# Patient Record
Sex: Female | Born: 1980
Health system: Southern US, Community
[De-identification: ages and names within clinical notes are randomized; demographics above are authoritative.]

## PROBLEM LIST (undated history)

## (undated) DIAGNOSIS — Z86718 Personal history of other venous thrombosis and embolism: Secondary | ICD-10-CM

## (undated) DIAGNOSIS — Z8639 Personal history of other endocrine, nutritional and metabolic disease: Secondary | ICD-10-CM

## (undated) DIAGNOSIS — J302 Other seasonal allergic rhinitis: Secondary | ICD-10-CM

## (undated) DIAGNOSIS — R51 Headache: Secondary | ICD-10-CM

## (undated) DIAGNOSIS — J189 Pneumonia, unspecified organism: Secondary | ICD-10-CM

## (undated) DIAGNOSIS — F419 Anxiety disorder, unspecified: Secondary | ICD-10-CM

## (undated) DIAGNOSIS — H52209 Unspecified astigmatism, unspecified eye: Secondary | ICD-10-CM

## (undated) DIAGNOSIS — I2699 Other pulmonary embolism without acute cor pulmonale: Secondary | ICD-10-CM

## (undated) DIAGNOSIS — D219 Benign neoplasm of connective and other soft tissue, unspecified: Secondary | ICD-10-CM

## (undated) DIAGNOSIS — H539 Unspecified visual disturbance: Secondary | ICD-10-CM

## (undated) DIAGNOSIS — J45909 Unspecified asthma, uncomplicated: Secondary | ICD-10-CM

## (undated) DIAGNOSIS — Z973 Presence of spectacles and contact lenses: Secondary | ICD-10-CM

## (undated) DIAGNOSIS — N939 Abnormal uterine and vaginal bleeding, unspecified: Secondary | ICD-10-CM

## (undated) DIAGNOSIS — E559 Vitamin D deficiency, unspecified: Secondary | ICD-10-CM

## (undated) DIAGNOSIS — M2550 Pain in unspecified joint: Secondary | ICD-10-CM

## (undated) DIAGNOSIS — R7303 Prediabetes: Secondary | ICD-10-CM

## (undated) DIAGNOSIS — D649 Anemia, unspecified: Secondary | ICD-10-CM

## (undated) DIAGNOSIS — E282 Polycystic ovarian syndrome: Secondary | ICD-10-CM

## (undated) HISTORY — DX: Headache: R51

## (undated) HISTORY — DX: Vitamin D deficiency, unspecified: E55.9

## (undated) HISTORY — PX: DILATION AND CURETTAGE OF UTERUS: SHX78

## (undated) HISTORY — DX: Prediabetes: R73.03

## (undated) HISTORY — DX: Benign neoplasm of connective and other soft tissue, unspecified: D21.9

## (undated) HISTORY — DX: Personal history of other venous thrombosis and embolism: Z86.718

## (undated) HISTORY — DX: Pain in unspecified joint: M25.50

## (undated) HISTORY — DX: Unspecified visual disturbance: H53.9

## (undated) HISTORY — DX: Unspecified astigmatism, unspecified eye: H52.209

## (undated) HISTORY — DX: Polycystic ovarian syndrome: E28.2

## (undated) HISTORY — DX: Anemia, unspecified: D64.9

## (undated) HISTORY — PX: MYOMECTOMY: SHX85

---

## 2004-06-08 HISTORY — PX: WISDOM TOOTH EXTRACTION: SHX21

## 2004-10-07 ENCOUNTER — Emergency Department (HOSPITAL_COMMUNITY): Admission: EM | Admit: 2004-10-07 | Discharge: 2004-10-07 | Payer: Self-pay | Admitting: Emergency Medicine

## 2010-08-15 ENCOUNTER — Inpatient Hospital Stay (INDEPENDENT_AMBULATORY_CARE_PROVIDER_SITE_OTHER)
Admission: RE | Admit: 2010-08-15 | Discharge: 2010-08-15 | Disposition: A | Discharge status: Home / Self Care | Payer: 59 | Source: Ambulatory Visit | Attending: Family Medicine | Admitting: Family Medicine

## 2010-08-15 DIAGNOSIS — J019 Acute sinusitis, unspecified: Secondary | ICD-10-CM

## 2011-07-03 ENCOUNTER — Emergency Department (HOSPITAL_COMMUNITY)
Admission: EM | Admit: 2011-07-03 | Discharge: 2011-07-03 | Disposition: A | Discharge status: Home / Self Care | Payer: 59 | Source: Home / Self Care

## 2011-07-03 ENCOUNTER — Encounter (HOSPITAL_COMMUNITY): Payer: Self-pay

## 2011-07-03 DIAGNOSIS — J209 Acute bronchitis, unspecified: Secondary | ICD-10-CM

## 2011-07-03 DIAGNOSIS — J069 Acute upper respiratory infection, unspecified: Secondary | ICD-10-CM

## 2011-07-03 MED ORDER — AZITHROMYCIN 250 MG PO TABS: ORAL_TABLET | ORAL | Status: AC

## 2011-07-03 MED ORDER — GUAIFENESIN-CODEINE 100-10 MG/5ML PO SYRP: ORAL_SOLUTION | ORAL | Status: AC

## 2011-07-03 NOTE — ED Notes (Signed)
Pt states sx started on Tuesday with scratchy throat, states since then she has developed productive cough, runny nose, nasal congestion, sinus pressure and just "feels bad".

## 2011-07-03 NOTE — ED Provider Notes (Signed)
History     CSN: [id number]  Arrival date & time 07/03/11  1058   None     Chief Complaint  Patient presents with  . URI    (Consider location/radiation/quality/duration/timing/severity/associated sxs/prior treatment) HPI Comments: Patient presents with complaints of cold symptoms for the last 4-5 days. She states her symptoms are progressively worsening. She began with feeling of congestion in her throat 5 days ago. The next day she felt like she had an itchy scratchy throat. Then the following day began nasal congestion, postnasal drainage and cough. Her symptoms have continued to worsen now she feels fatigued and states that she is just overall feeling poorly. She is taking Mucinex and over-the-counter cough medication with minimal relief.   History reviewed. No pertinent past medical history.  History reviewed. No pertinent past surgical history.  No family history on file.  History  Substance Use Topics  . Smoking status: Never Smoker   . Smokeless tobacco: Not on file  . Alcohol Use: Yes     occasional    OB History    Grav Para Term Preterm Abortions TAB SAB Ect Mult Living                  Review of Systems  Constitutional: Positive for fatigue. Negative for fever and chills.  HENT: Positive for congestion, rhinorrhea and postnasal drip. Negative for ear pain, sore throat, sneezing and sinus pressure.   Respiratory: Positive for cough. Negative for shortness of breath and wheezing.   Cardiovascular: Negative for chest pain and palpitations.    Allergies  Review of patient's allergies indicates no known allergies.  Home Medications   Current Outpatient Rx  Name Route Sig Dispense Refill  . NORGESTIM-ETH ESTRAD TRIPHASIC 0.18/0.215/0.25 MG-25 MCG PO TABS Oral Take 1 tablet by mouth daily.    . AZITHROMYCIN 250 MG PO TABS  take 2 tabs today, then 1 tab daily x 4 days 6 each 0  . GUAIFENESIN-CODEINE 100-10 MG/5ML PO SYRP  1-2 tsp every 6 hrs prn cough 120 mL  0    BP 114/67  Pulse 86  Temp(Src) 98.1 F (36.7 C) (Oral)  Resp 18  SpO2 99%  LMP 06/15/2011  Physical Exam  Nursing note and vitals reviewed. Constitutional: She appears well-developed and well-nourished. No distress.  HENT:  Head: Normocephalic and atraumatic.  Right Ear: Tympanic membrane, external ear and ear canal normal.  Left Ear: Tympanic membrane, external ear and ear canal normal.  Nose: Nose normal. Right sinus exhibits no maxillary sinus tenderness. Left sinus exhibits no maxillary sinus tenderness.  Mouth/Throat: Uvula is midline, oropharynx is clear and moist and mucous membranes are normal. No oropharyngeal exudate, posterior oropharyngeal edema or posterior oropharyngeal erythema.  Neck: Neck supple.  Cardiovascular: Normal rate, regular rhythm and normal heart sounds.   Pulmonary/Chest: Effort normal and breath sounds normal. No respiratory distress.  Lymphadenopathy:    She has no cervical adenopathy.  Neurological: She is alert.  Skin: Skin is warm and dry.  Psychiatric: She has a normal mood and affect.    ED Course  Procedures (including critical care time)  Labs Reviewed - No data to display No results found.   1. Acute URI   2. Acute bronchitis       MDM  Worsening URI symptoms and cough. Exam neg.         Melody Comas, [place] 07/03/11 1325

## 2011-07-03 NOTE — ED Provider Notes (Signed)
Medical screening examination/treatment/procedure(s) were performed by non-physician practitioner and as supervising physician I was immediately available for consultation/collaboration.  Luiz Blare MD   Luiz Blare, MD 07/03/11 1728

## 2012-05-19 ENCOUNTER — Encounter (HOSPITAL_COMMUNITY): Payer: Self-pay | Admitting: Emergency Medicine

## 2012-05-19 ENCOUNTER — Emergency Department (INDEPENDENT_AMBULATORY_CARE_PROVIDER_SITE_OTHER): Payer: 59

## 2012-05-19 ENCOUNTER — Emergency Department (HOSPITAL_COMMUNITY)
Admission: EM | Admit: 2012-05-19 | Discharge: 2012-05-19 | Disposition: A | Discharge status: Home / Self Care | Payer: 59 | Source: Home / Self Care | Attending: Emergency Medicine | Admitting: Emergency Medicine

## 2012-05-19 DIAGNOSIS — J189 Pneumonia, unspecified organism: Secondary | ICD-10-CM

## 2012-05-19 DIAGNOSIS — R05 Cough: Secondary | ICD-10-CM

## 2012-05-19 DIAGNOSIS — R059 Cough, unspecified: Secondary | ICD-10-CM

## 2012-05-19 MED ORDER — ALBUTEROL SULFATE HFA 108 (90 BASE) MCG/ACT IN AERS: 1.0000 | INHALATION_SPRAY | Freq: Four times a day (QID) | RESPIRATORY_TRACT | Status: DC | PRN

## 2012-05-19 MED ORDER — SALINE NASAL SPRAY 0.65 % NA SOLN: 1.0000 | NASAL | Status: DC | PRN

## 2012-05-19 MED ORDER — AMOXICILLIN-POT CLAVULANATE 875-125 MG PO TABS: 1.0000 | ORAL_TABLET | Freq: Two times a day (BID) | ORAL | Status: DC

## 2012-05-19 MED ORDER — GUAIFENESIN-CODEINE 100-10 MG/5ML PO SYRP: 5.0000 mL | ORAL_SOLUTION | Freq: Three times a day (TID) | ORAL | Status: DC | PRN

## 2012-05-19 MED ORDER — AZITHROMYCIN 250 MG PO TABS: ORAL_TABLET | ORAL | Status: DC

## 2012-05-19 NOTE — ED Notes (Signed)
Pt c/o productive cough that is gradually getting worse, chest tightness, sinus and chest congestion. Unable to rest at night due to coughing fits. Pt denies fever, n/v/d. Pt states that she has been taking mucinex x 2 daily and aleve with no relief of symptoms.

## 2012-05-19 NOTE — ED Provider Notes (Signed)
History     CSN: [id number]  Arrival date & time 05/19/12  1601   First MD Initiated Contact with Patient 05/19/12 1804      Chief Complaint  Patient presents with  . Cough    productive cough, chest tightness, sinus pressure    (Consider location/radiation/quality/duration/timing/severity/associated sxs/prior treatment) Patient is a 31 y.o. female presenting with cough. The history is provided by the patient.  Cough This is a new problem. The current episode started 2 days ago. The problem occurs hourly. The problem has been gradually worsening. The cough is non-productive. There has been no fever. Associated symptoms include chest pain, chills, headaches, myalgias and shortness of breath. Pertinent negatives include no ear congestion, no ear pain and no sore throat. She has tried decongestants (cough syrup, mucinex) for the symptoms. The treatment provided mild relief. She is not a smoker. Her past medical history does not include bronchitis, pneumonia, COPD or asthma.    History reviewed. No pertinent past medical history.  History reviewed. No pertinent past surgical history.  History reviewed. No pertinent family history.  History  Substance Use Topics  . Smoking status: Never Smoker   . Smokeless tobacco: Not on file  . Alcohol Use: Yes     Comment: occasional    OB History    Grav Para Term Preterm Abortions TAB SAB Ect Mult Living                  Review of Systems  Constitutional: Positive for chills.  HENT: Negative for ear pain and sore throat.   Respiratory: Positive for cough and shortness of breath.   Cardiovascular: Positive for chest pain.  Musculoskeletal: Positive for myalgias.  Neurological: Positive for headaches.  All other systems reviewed and are negative.    Allergies  Review of patient's allergies indicates no known allergies.  Home Medications   Current Outpatient Rx  Name  Route  Sig  Dispense  Refill  . ALBUTEROL SULFATE HFA 108  (90 BASE) MCG/ACT IN AERS   Inhalation   Inhale 1-2 puffs into the lungs every 6 (six) hours as needed for wheezing.   1 Inhaler   0   . AMOXICILLIN-POT CLAVULANATE 875-125 MG PO TABS   Oral   Take 1 tablet by mouth every 12 (twelve) hours.   20 tablet   0   . AZITHROMYCIN 250 MG PO TABS      Azithromycin 500mg  on day 1, then 250mg  on days 2-4   6 tablet   0   . GUAIFENESIN-CODEINE 100-10 MG/5ML PO SYRP   Oral   Take 5 mLs by mouth 3 (three) times daily as needed for cough.   120 mL   0   . NORGESTIM-ETH ESTRAD TRIPHASIC 0.18/0.215/0.25 MG-25 MCG PO TABS   Oral   Take 1 tablet by mouth daily.         [name] SALINE NASAL SPRAY 0.65 % NA SOLN   Nasal   Place 1 spray into the nose as needed for congestion.   30 mL   12     BP 120/79  Pulse 88  Temp 98.9 F (37.2 C) (Oral)  Resp 16  SpO2 100%  LMP 05/16/2012  Physical Exam  Nursing note and vitals reviewed. Constitutional: She is oriented to person, place, and time. Vital signs are normal. She appears well-developed and well-nourished. She is active and cooperative. No distress.  HENT:  Head: Normocephalic.  Right Ear: External ear normal.  Left Ear: External  ear normal.  Nose: Right sinus exhibits frontal sinus tenderness. Right sinus exhibits no maxillary sinus tenderness. Left sinus exhibits frontal sinus tenderness. Left sinus exhibits no maxillary sinus tenderness.  Mouth/Throat: Uvula is midline. Posterior oropharyngeal erythema present. No oropharyngeal exudate.  Eyes: Conjunctivae normal are normal. Pupils are equal, round, and reactive to light. No scleral icterus.  Neck: Trachea normal and normal range of motion. Neck supple. No Brudzinski's sign noted.  Cardiovascular: Normal rate, regular rhythm, normal heart sounds, intact distal pulses and normal pulses.   Pulmonary/Chest: Effort normal and breath sounds normal.  Abdominal: Soft. Bowel sounds are normal. There is no tenderness.  Lymphadenopathy:        Head (right side): No submental, no submandibular, no tonsillar, no preauricular, no posterior auricular and no occipital adenopathy present.       Head (left side): No submental, no submandibular, no tonsillar, no preauricular, no posterior auricular and no occipital adenopathy present.    She has no cervical adenopathy.  Neurological: She is alert and oriented to person, place, and time. No cranial nerve deficit or sensory deficit. GCS eye subscore is 4. GCS verbal subscore is 5. GCS motor subscore is 6.  Skin: Skin is warm and dry. She is not diaphoretic.  Psychiatric: She has a normal mood and affect. Her speech is normal and behavior is normal. Judgment and thought content normal. Cognition and memory are normal.    ED Course  Procedures (including critical care time)  Labs Reviewed - No data to display Dg Chest 2 View  05/19/2012  *RADIOLOGY REPORT*  Clinical Data: Cough and congestion.  CHEST - 2 VIEW  Comparison: None.  Findings: There is right lower lobe airspace disease worrisome for pneumonia.  The left lung is clear.  Lung volumes are low.  No pneumothorax or pleural fluid.  Heart size normal.  IMPRESSION: Right lower lobe airspace disease worrisome for pneumonia.   Original Report Authenticated By: Holley Dexter, M.D.      1. Community acquired pneumonia   2. Cough       MDM  Increase fluids, medications as prescribed.  Follow up with pcp or mcuc in 2 days for re-eval.       Johnsie Kindred, NP 05/19/12 1846

## 2012-05-19 NOTE — ED Notes (Deleted)
Pt c/o stomach pain x 2 days and diarrhea x3 days. Pt states that he ate a lot of  Chips at airport in [place]. Pt father states that he started feeling ? Ill after eating chips. Pt denies any other symptoms.

## 2012-05-19 NOTE — ED Provider Notes (Signed)
Medical screening examination/treatment/procedure(s) were performed by non-physician practitioner and as supervising physician I was immediately available for consultation/collaboration.  Emerie Vanderkolk, M.D.   Kalon Erhardt C Hisayo Delossantos, MD 05/19/12 2250 

## 2014-01-17 ENCOUNTER — Encounter: Payer: Self-pay | Admitting: *Deleted

## 2014-01-18 ENCOUNTER — Ambulatory Visit (INDEPENDENT_AMBULATORY_CARE_PROVIDER_SITE_OTHER): Payer: 59 | Admitting: Neurology

## 2014-01-18 ENCOUNTER — Encounter: Payer: Self-pay | Admitting: Neurology

## 2014-01-18 VITALS — BP 120/74 | HR 89 | Ht 63.0 in | Wt 240.0 lb

## 2014-01-18 DIAGNOSIS — H53139 Sudden visual loss, unspecified eye: Secondary | ICD-10-CM

## 2014-01-18 DIAGNOSIS — H53131 Sudden visual loss, right eye: Secondary | ICD-10-CM

## 2014-01-18 LAB — BASIC METABOLIC PANEL
BUN / CREAT RATIO: 12 (ref 8–20)
BUN: 11 mg/dL (ref 6–20)
CALCIUM: 8.9 mg/dL (ref 8.7–10.2)
CHLORIDE: 103 mmol/L (ref 96–108)
CO2: 25 mmol/L (ref 18–29)
Creatinine, Ser: 0.93 mg/dL (ref 0.57–1.00)
GFR calc Af Amer: 93 mL/min/{1.73_m2} (ref >59)
GFR calc non Af Amer: 80 mL/min/{1.73_m2} (ref >59)
Glucose: 81 mg/dL (ref 65–99)
POTASSIUM: 4.1 mmol/L (ref 3.5–5.2)
SODIUM: 136 mmol/L (ref 134–144)

## 2014-01-18 NOTE — Progress Notes (Addendum)
[facility]    Provider:  Dr Jaynee Eagles Referring Provider: Marylynn Pearson, MD Primary Care Physician:  Marylynn Pearson, MD  CC:  Sudden persistent vision loss  HPI:  Amanda Murphy is a 33 y.o. female here as a referral from Dr. [name] for sudden persistent vision loss  33 year old with a PMHx of migraines, obesity, PCOS who is here for a new appointment to evaluate sudden persistent right eye vision changes since May. The right eye is blurry. Saw an optometrist in June with a normal fundoscopic eye exam. Vision changes started during a migraine and never resolved. Vision changes were sudden, did not develop over a period of hours or days. Is stable, never worsened or progressed. No flickers or flashes of light. Left eye is unaffected. No changes in color either eye. No droopy eyelids, no dysarthria, no dysphagia, no changes in sensation, no weakness, no gait changes, no hearing changes or any other focal neurologic symptoms. Nothing makes it better, nothing makes it worse. No head trauma. Does have some sinus problems. No new medications at the time. Has not had any imaging of brain. No significant past medical history of or episodes of focal neurologic symptoms. No pain on eye movements. Migraines once a year due to sinuses described as pressure around the whole head, not throbbing, no nausea, no vomiting no light sensitivity. Hasn't had a headache since May. The headache in May was different than her normal headaches, the right side of face was hurting as well with a radiating type pain she can't remember well enough to describe.  Denies diabetes, no high cholesterol, no HTN, no smoking. No FHx of neurologic disorders. No FHx of stroke. Has an OBGYN Dr. Kathryne Eriksson checks bloodwork yearly including diabetes and cholersterol  Had a visual evoked potential. And formal Visual field testing in June.   Reviewed notes, labs and imaging from outside physicians, which showed:    11/29/2013: OD 20/80, OS 20/30, normal slit lamp examination, normal fundoscopic exam,  12/05/2013: OD 20/70, OS 20/25, PERRL, RT hemi VF defect on Humphrey Visual Field testing, normal slit lamp exam. Normal pressure OD 21 OS 17, normal fundoscopic exam,  12/20/2013: OD 20/60, OS 20/30, IOP OD15 OS 12, OD left hemi and OS rt quad on Humphrey Visual Field testing, slit lmp normal, fundoscopic exam nml,     Review of Systems: Out of a complete 14 system review, the patient complains of only the following symptoms, and all other reviewed systems are negative. Blurred vision  History   Social History  . Marital Status: Single    Spouse Name: N/A    Number of Children: 0  . Years of Education: BSN   Occupational History  .  [employer]   Social History Main Topics  . Smoking status: Never Smoker   . Smokeless tobacco: Never Used  . Alcohol Use: Yes     Comment: occasional  . Drug Use: No  . Sexual Activity: Yes    Birth Control/ Protection: Pill   Other Topics Concern  . Not on file   Social History Narrative   Patient is single with no children.   Patient is right handed.   Patient has a Copywriter, advertising.   Patient drinks coffee 3 cups a week, tea once daily.    Family History  Problem Relation Age of Onset  . Hypertension Mother   . Diabetes Mother     No past medical history on file.  Past Surgical History  Procedure  Laterality Date  . Robotic assisted laparoscopic vaginal hysterectomy with fibroid removal  02/07/2014    [facility]  . Wisdom tooth extraction  2006    Current Outpatient Prescriptions  Medication Sig Dispense Refill  . etonogestrel-ethinyl estradiol (NUVARING) 0.12-0.015 MG/24HR vaginal ring Place 1 each vaginally every 28 (twenty-eight) days. Insert vaginally and leave in place for 3 consecutive weeks, then remove for 1 week.      . phentermine 37.5 MG capsule Take 37.5 mg by mouth every morning.       No current facility-administered medications for this visit.     Allergies as of 01/18/2014  . (No Known Allergies)    Vitals: BP 120/74  Pulse 89  Ht 5\' 3"  (1.6 m)  Wt 240 lb (108.863 kg)  BMI 42.52 kg/m2 Last Weight:  Wt Readings from Last 1 Encounters:  01/18/14 240 lb (108.863 kg)   Last Height:   Ht Readings from Last 1 Encounters:  01/18/14 5\' 3"  (1.6 m)     Physical exam: Exam: Gen: NAD, conversant Eyes: anicteric sclerae, moist conjunctivae HENT: Atraumatic, oropharynx clear Neck: Trachea midline; supple,  Lungs: CTA, no wheezing, rales, rhonic                          CV: RRR, no MRG Abdomen: Soft, non-tender;  Extremities: No peripheral edema  Skin: Normal temperature, no rash,  Psych: Appropriate affect, pleasant  Neuro: MS: AA&Ox3, appropriately interactive, normal affect   Speech: fluent w/o paraphasic error  Memory: good recent and remote recall  CN: PERRL, EOMI no nystagmus, Fundi Flat, No APD, VA 20/100 OU corected, 20/30 OS corrected, no ptosis, sensation intact to LT V1-V3 bilat, face symmetric, no weakness, hearing grossly intact, palate elevates symmetrically, shoulder shrug 5/5 bilat, tongue protrudes midline, no fasiculations noted. Right upper quadrant visual defect to confrontation.  Motor: normal bulk and tone Strength: 5/5  In all extremities  Coord: rapid alternating and point-to-point (FNF, HTS) movements intact.  Reflexes: symmetrical, bilat downgoing toes  Sens: LT intact in all extremities  Gait: posture, stance, stride and arm-swing normal. Tandem gait intact. Able to walk on heels and toes. Romberg absent.   Assessment and plan:  33 year old patient with acute, persistent, stable monocular temporal superior right quadrant visual defect. Neurologic exam is otherwise normal including fundoscopic exam. Symptoms occurred 3 months ago in the setting of a headache and have remained stable. No other inciting factors, no risk factors for stroke other than obesity (denies DM, HTN, HLD, smoking  or other vascular risk factors).   DDX includes optic neuritis, ischemic optic neuropathy, branch retinal vascular occlusion or possibly structural cause such as a juxta-sellar lesion. MRI results will guide further testing.   Will order MRI of the brain w/wo contrast MS protocol and stroke protocol, MRA of the head and neck. May need referral for dilated eye exam and neuro-opthamologist pending results of MRI Formal visual field testing and evoked potentials were completed.   A total of 75 minutes was spent in with this patient. Over half this time was spent on counseling patient on the diagnosis and different therapeutic options available.    Addendum: Documentation received after appointment from [facility] shows formal visual field analysis with deficits in the  right temporal upper and lower quadrants  Sarina Ill, MD  [facility] [address] [place], [place] [address]  Phone [phone] Fax [phone]

## 2014-01-18 NOTE — Patient Instructions (Signed)
Remember to drink plenty of fluid, eat healthy meals and do not skip any meals. Try to eat protein with a every meal and eat a healthy snack such as fruit or nuts in between meals. Try to keep a regular sleep-wake schedule and try to exercise daily, particularly in the form of walking, 20-30 minutes a day, if you can.   As far as diagnostic testing: MRI of the brain, MRA of the head and neck, routine blood work  I would like to see you back after imaging complete, sooner if we need to. Please call us with any interim questions, concerns, problems, updates or refill requests.   I will call you so we can go over those with you on the phone.  My clinical assistant and will answer any of your questions and relay your messages to me and also relay most of my messages to you.   Our phone number is [phone]. We also have an after hours call service for urgent matters and there is a physician on-call for urgent questions. For any emergencies you know to call 911 or go to the nearest emergency room

## 2014-01-19 ENCOUNTER — Encounter: Payer: Self-pay | Admitting: *Deleted

## 2014-01-25 ENCOUNTER — Encounter (HOSPITAL_COMMUNITY): Payer: Self-pay | Admitting: *Deleted

## 2014-01-30 ENCOUNTER — Encounter (HOSPITAL_COMMUNITY): Payer: Self-pay | Admitting: Pharmacist

## 2014-02-01 ENCOUNTER — Ambulatory Visit (INDEPENDENT_AMBULATORY_CARE_PROVIDER_SITE_OTHER): Payer: 59

## 2014-02-01 DIAGNOSIS — H53131 Sudden visual loss, right eye: Secondary | ICD-10-CM

## 2014-02-01 DIAGNOSIS — H53139 Sudden visual loss, unspecified eye: Secondary | ICD-10-CM

## 2014-02-01 MED ORDER — GADOPENTETATE DIMEGLUMINE 469.01 MG/ML IV SOLN: 20.0000 mL | Freq: Once | INTRAVENOUS | Status: AC | PRN

## 2014-02-05 ENCOUNTER — Other Ambulatory Visit: Payer: Self-pay | Admitting: Neurology

## 2014-02-05 DIAGNOSIS — E236 Other disorders of pituitary gland: Secondary | ICD-10-CM

## 2014-02-05 DIAGNOSIS — H539 Unspecified visual disturbance: Secondary | ICD-10-CM

## 2014-02-05 DIAGNOSIS — G930 Cerebral cysts: Secondary | ICD-10-CM

## 2014-02-06 MED ORDER — DEXTROSE 5 % IV SOLN
2.0000 g | INTRAVENOUS | Status: AC
  Administered 2014-02-07: 2 g via INTRAVENOUS
  Filled 2014-02-06: qty 2

## 2014-02-06 NOTE — H&P (Addendum)
33 yo G0 with menorrhagia and irregular vaginal bleeding despite treatment with nuvaring presents for surgical mngt  PMHx:  Cervical dysplasia, migraines PSHx:  Wisdom teeth, rectal cyst removed All:  None Meds:  MTV, nuvaring Shx:  Negative tobacco use, occ etoh FHx:  Thyroid dz, HTN, DM, Breast cancer, osteoporosis, arthritis  AF, VSS Gen - NAD CV - RRR Lungs - CTA Abd - soft, NT PV - uterus 10-12 wks, mobile, NT.  [place]:  Multiple fibroids, 2.8 cm intracavitary mass, suspect submucus fibroid.  2.1 cm simple ov cyst.  No FF Hgb 10 in office  A/P:  Menorrhagia Hysteroscopy, D&C, resection of intracavitary mass R/b/a discussed, questions answered, informed consent

## 2014-02-07 ENCOUNTER — Encounter (HOSPITAL_COMMUNITY): Payer: 59 | Admitting: Anesthesiology

## 2014-02-07 ENCOUNTER — Ambulatory Visit (HOSPITAL_COMMUNITY): Payer: 59 | Admitting: Anesthesiology

## 2014-02-07 ENCOUNTER — Encounter (HOSPITAL_COMMUNITY)
Admission: RE | Disposition: A | Discharge status: Home / Self Care | Payer: Self-pay | Source: Ambulatory Visit | Attending: Obstetrics and Gynecology

## 2014-02-07 ENCOUNTER — Ambulatory Visit (HOSPITAL_COMMUNITY)
Admission: RE | Admit: 2014-02-07 | Discharge: 2014-02-07 | Disposition: A | Discharge status: Home / Self Care | Payer: 59 | Source: Ambulatory Visit | Attending: Obstetrics and Gynecology | Admitting: Obstetrics and Gynecology

## 2014-02-07 ENCOUNTER — Encounter (HOSPITAL_COMMUNITY): Payer: Self-pay | Admitting: Anesthesiology

## 2014-02-07 DIAGNOSIS — N920 Excessive and frequent menstruation with regular cycle: Secondary | ICD-10-CM | POA: Diagnosis present

## 2014-02-07 DIAGNOSIS — Z6841 Body Mass Index (BMI) 40.0 and over, adult: Secondary | ICD-10-CM | POA: Diagnosis not present

## 2014-02-07 DIAGNOSIS — D250 Submucous leiomyoma of uterus: Secondary | ICD-10-CM | POA: Diagnosis not present

## 2014-02-07 LAB — CBC
HEMATOCRIT: 28.7 % — AB (ref 36.0–46.0)
HEMOGLOBIN: 8.6 g/dL — AB (ref 12.0–15.0)
MCH: 23.6 pg — ABNORMAL LOW (ref 26.0–34.0)
MCHC: 30 g/dL (ref 30.0–36.0)
MCV: 78.6 fL (ref 78.0–100.0)
Platelets: 480 10*3/uL — ABNORMAL HIGH (ref 150–400)
RBC: 3.65 MIL/uL — AB (ref 3.87–5.11)
RDW: 18.3 % — ABNORMAL HIGH (ref 11.5–15.5)
WBC: 6.1 10*3/uL (ref 4.0–10.5)

## 2014-02-07 LAB — PREGNANCY, URINE: PREG TEST UR: NEGATIVE

## 2014-02-07 SURGERY — DILATATION & CURETTAGE/HYSTEROSCOPY WITH MYOSURE
Anesthesia: Choice

## 2014-02-07 MED ORDER — LIDOCAINE HCL (CARDIAC) 20 MG/ML IV SOLN
INTRAVENOUS | Status: DC | PRN
  Administered 2014-02-07: 30 mg via INTRAVENOUS
  Administered 2014-02-07: 70 mg via INTRAVENOUS

## 2014-02-07 MED ORDER — LIDOCAINE HCL (CARDIAC) 20 MG/ML IV SOLN
INTRAVENOUS | Status: AC
  Filled 2014-02-07: qty 5

## 2014-02-07 MED ORDER — ONDANSETRON HCL 4 MG/2ML IJ SOLN
INTRAMUSCULAR | Status: DC | PRN
  Administered 2014-02-07: 4 mg via INTRAVENOUS

## 2014-02-07 MED ORDER — EPHEDRINE 5 MG/ML INJ
INTRAVENOUS | Status: AC
  Filled 2014-02-07: qty 10

## 2014-02-07 MED ORDER — DEXAMETHASONE SODIUM PHOSPHATE 4 MG/ML IJ SOLN
INTRAMUSCULAR | Status: AC
  Filled 2014-02-07: qty 1

## 2014-02-07 MED ORDER — ONDANSETRON HCL 4 MG/2ML IJ SOLN
INTRAMUSCULAR | Status: AC
  Filled 2014-02-07: qty 2

## 2014-02-07 MED ORDER — OXYCODONE-ACETAMINOPHEN 5-325 MG PO TABS: 1.0000 | ORAL_TABLET | ORAL | Status: DC | PRN

## 2014-02-07 MED ORDER — FENTANYL CITRATE 0.05 MG/ML IJ SOLN
INTRAMUSCULAR | Status: AC
  Filled 2014-02-07: qty 2

## 2014-02-07 MED ORDER — SCOPOLAMINE 1 MG/3DAYS TD PT72
MEDICATED_PATCH | TRANSDERMAL | Status: AC
  Administered 2014-02-07: 1.5 mg via TRANSDERMAL
  Filled 2014-02-07: qty 1

## 2014-02-07 MED ORDER — ACETAMINOPHEN 160 MG/5ML PO SOLN
960.0000 mg | Freq: Four times a day (QID) | ORAL | Status: DC | PRN
  Administered 2014-02-07: 960 mg via ORAL

## 2014-02-07 MED ORDER — DEXAMETHASONE SODIUM PHOSPHATE 10 MG/ML IJ SOLN
INTRAMUSCULAR | Status: DC | PRN
  Administered 2014-02-07: 4 mg via INTRAVENOUS

## 2014-02-07 MED ORDER — ACETAMINOPHEN 160 MG/5ML PO SOLN
ORAL | Status: AC
  Administered 2014-02-07: 960 mg via ORAL
  Filled 2014-02-07: qty 40.6

## 2014-02-07 MED ORDER — SODIUM CHLORIDE 0.9 % IR SOLN
Status: DC | PRN
  Administered 2014-02-07 (×3): 3000 mL

## 2014-02-07 MED ORDER — MIDAZOLAM HCL 2 MG/2ML IJ SOLN
INTRAMUSCULAR | Status: DC | PRN
  Administered 2014-02-07: 2 mg via INTRAVENOUS

## 2014-02-07 MED ORDER — SCOPOLAMINE 1 MG/3DAYS TD PT72
1.0000 | MEDICATED_PATCH | Freq: Once | TRANSDERMAL | Status: DC
  Administered 2014-02-07: 1.5 mg via TRANSDERMAL

## 2014-02-07 MED ORDER — PROPOFOL 10 MG/ML IV BOLUS
INTRAVENOUS | Status: DC | PRN
  Administered 2014-02-07: 160 mg via INTRAVENOUS

## 2014-02-07 MED ORDER — KETOROLAC TROMETHAMINE 30 MG/ML IJ SOLN
INTRAMUSCULAR | Status: AC
  Filled 2014-02-07: qty 1

## 2014-02-07 MED ORDER — LACTATED RINGERS IV SOLN
INTRAVENOUS | Status: DC
  Administered 2014-02-07 (×2): via INTRAVENOUS

## 2014-02-07 MED ORDER — FENTANYL CITRATE 0.05 MG/ML IJ SOLN
INTRAMUSCULAR | Status: DC | PRN
  Administered 2014-02-07 (×2): 50 ug via INTRAVENOUS

## 2014-02-07 MED ORDER — FENTANYL CITRATE 0.05 MG/ML IJ SOLN: 25.0000 ug | INTRAMUSCULAR | Status: DC | PRN

## 2014-02-07 MED ORDER — LIDOCAINE HCL 1 % IJ SOLN
INTRAMUSCULAR | Status: DC | PRN
Start: 2014-02-07 — End: 2014-02-07
  Administered 2014-02-07: 10 mL

## 2014-02-07 MED ORDER — MIDAZOLAM HCL 2 MG/2ML IJ SOLN
INTRAMUSCULAR | Status: AC
  Filled 2014-02-07: qty 2

## 2014-02-07 MED ORDER — EPHEDRINE SULFATE 50 MG/ML IJ SOLN
INTRAMUSCULAR | Status: DC | PRN
  Administered 2014-02-07: 5 mg via INTRAVENOUS
  Administered 2014-02-07: 10 mg via INTRAVENOUS

## 2014-02-07 MED ORDER — PROPOFOL 10 MG/ML IV EMUL
INTRAVENOUS | Status: AC
  Filled 2014-02-07: qty 20

## 2014-02-07 MED ORDER — LIDOCAINE HCL 1 % IJ SOLN
INTRAMUSCULAR | Status: AC
  Filled 2014-02-07: qty 20

## 2014-02-07 MED ORDER — KETOROLAC TROMETHAMINE 30 MG/ML IJ SOLN
INTRAMUSCULAR | Status: DC | PRN
  Administered 2014-02-07: 30 mg via INTRAVENOUS

## 2014-02-07 SURGICAL SUPPLY — 26 items
ABLATOR ENDOMETRIAL BIPOLAR (ABLATOR) IMPLANT
CANISTER SUCT 3000ML (MISCELLANEOUS) ×5 IMPLANT
CATH ROBINSON RED A/P 16FR (CATHETERS) ×2 IMPLANT
CATH THERMACHOICE III (CATHETERS) IMPLANT
CLOTH BEACON ORANGE TIMEOUT ST (SAFETY) ×2 IMPLANT
CONTAINER PREFILL 10% NBF 60ML (FORM) ×4 IMPLANT
DEVICE MYOSURE CLASSIC (MISCELLANEOUS) IMPLANT
DEVICE MYOSURE LITE (MISCELLANEOUS) IMPLANT
DEVICE MYOSURE TISSUE REMOV XL (ABLATOR) ×2 IMPLANT
DILATOR CANAL MILEX (MISCELLANEOUS) ×1 IMPLANT
DRAPE HYSTEROSCOPY (DRAPE) ×2 IMPLANT
ELECT REM PT RETURN 9FT ADLT (ELECTROSURGICAL)
ELECTRODE REM PT RTRN 9FT ADLT (ELECTROSURGICAL) ×1 IMPLANT
FILTER ARTHROSCOPY CONVERTOR (FILTER) ×2 IMPLANT
GLOVE BIO SURGEON STRL SZ 6.5 (GLOVE) ×2 IMPLANT
GLOVE BIOGEL PI IND STRL 7.0 (GLOVE) ×1 IMPLANT
GLOVE BIOGEL PI INDICATOR 7.0 (GLOVE) ×1
GOWN STRL REUS W/TWL LRG LVL3 (GOWN DISPOSABLE) ×4 IMPLANT
LOOP ANGLED CUTTING 22FR (CUTTING LOOP) ×1 IMPLANT
PACK VAGINAL MINOR WOMEN LF (CUSTOM PROCEDURE TRAY) ×2 IMPLANT
PAD OB MATERNITY 4.3X12.25 (PERSONAL CARE ITEMS) ×2 IMPLANT
SEAL ROD LENS SCOPE MYOSURE (ABLATOR) ×2 IMPLANT
SET TUBING HYSTEROSCOPY 2 NDL (TUBING) ×1 IMPLANT
TOWEL OR 17X24 6PK STRL BLUE (TOWEL DISPOSABLE) ×4 IMPLANT
TUBE HYSTEROSCOPY W Y-CONNECT (TUBING) ×1 IMPLANT
WATER STERILE IRR 1000ML POUR (IV SOLUTION) ×2 IMPLANT

## 2014-02-07 NOTE — Anesthesia Preprocedure Evaluation (Addendum)
Anesthesia Evaluation  Patient identified by MRN, date of birth, ID band Patient awake    Reviewed: Allergy & Precautions, H&P , Patient's Chart, lab work & pertinent test results, reviewed documented beta blocker date and time   Airway Mallampati: II  TM Distance: >3 FB Neck ROM: full    Dental no notable dental hx.    Pulmonary  breath sounds clear to auscultation  Pulmonary exam normal       Cardiovascular Rhythm:regular Rate:Normal     Neuro/Psych    GI/Hepatic   Endo/Other  Morbid obesity  Renal/GU      Musculoskeletal   Abdominal   Peds  Hematology   Anesthesia Other Findings   Reproductive/Obstetrics                             Anesthesia Physical Anesthesia Plan  ASA: III  Anesthesia Plan:    Post-op Pain Management:    Induction: Intravenous  Airway Management Planned: LMA  Additional Equipment:   Intra-op Plan:   Post-operative Plan:   Informed Consent: I have reviewed the patients History and Physical, chart, labs and discussed the procedure including the risks, benefits and alternatives for the proposed anesthesia with the patient or authorized representative who has indicated his/her understanding and acceptance.   Dental Advisory Given and Dental advisory given  Plan Discussed with: CRNA and Surgeon  Anesthesia Plan Comments: (Discussed GA with LMA, possible sore throat, potential need to switch to ETT, N/V, pulmonary aspiration. Questions answered. )        Anesthesia Quick Evaluation  

## 2014-02-07 NOTE — Transfer of Care (Signed)
Immediate Anesthesia Transfer of Care Note  Patient: Amanda Murphy  Procedure(s) Performed: Procedure(s): DILATATION & CURETTAGE/HYSTEROSCOPY WITH MYOSURE ABLATION (N/A)  Patient Location: PACU  Anesthesia Type:General  Level of Consciousness: awake, alert , sedated and patient cooperative  Airway & Oxygen Therapy: Patient Spontanous Breathing and Patient connected to nasal cannula oxygen  Post-op Assessment: Report given to PACU RN and Post -op Vital signs reviewed and stable  Post vital signs: Reviewed and stable  Complications: No apparent anesthesia complications

## 2014-02-07 NOTE — Discharge Instructions (Signed)

## 2014-02-07 NOTE — Anesthesia Postprocedure Evaluation (Signed)
  Anesthesia Post Note  Patient: Amanda Murphy  Procedure(s) Performed: Procedure(s) (LRB): DILATATION & CURETTAGE/HYSTEROSCOPY WITH MYOSURE ABLATION (N/A)  Anesthesia type: GA  Patient location: PACU  Post pain: Pain level controlled  Post assessment: Post-op Vital signs reviewed  Last Vitals:  Filed Vitals:   02/07/14 0846  BP: 115/53  Pulse: 100  Temp: 36.4 C  Resp: 12    Post vital signs: Reviewed  Level of consciousness: sedated  Complications: No apparent anesthesia complications

## 2014-02-08 NOTE — Op Note (Signed)
NAMESHELLIE, ROGOFF NO.:  [id number]  MEDICAL RECORD NO.:  [id number]  LOCATION:  [facility]                          FACILITY:  [facility]  PHYSICIAN:  Marylynn Pearson, MD    DATE OF BIRTH:  Jan 08, 1981  DATE OF PROCEDURE:  02/07/2014 DATE OF DISCHARGE:                              OPERATIVE REPORT   PREOPERATIVE DIAGNOSIS:  Menorrhagia.  POSTOPERATIVE DIAGNOSIS: 1. Menorrhagia. 2. Fibroids.  SURGEON:  Marylynn Pearson, MD  PROCEDURE: 1. Paracervical block. 2. Hysteroscopy with resection of submucous fibroids.  ANESTHESIA:  General.  BLOOD LOSS:  Minimal.  FLUID DEFICIT:  1250 mL (saline).  COMPLICATIONS:  None.  SPECIMEN:  Fibroids.  CONDITION:  Stable to recovery room.  DESCRIPTION OF PROCEDURE:  The patient was taken to the operating room. After informed consent was obtained, she was given general anesthesia, placed in dorsal lithotomy position using Allen stirrups.  She was prepped and draped in sterile fashion.  In and out catheter drained the bladder for 50 mL of clear urine.  Bivalve speculum was placed in the vagina and 10 mL of 1% lidocaine was used to perform a paracervical block.  Single-tooth tenaculum was attached to the anterior lip of the cervix.  The uterus was dilated using Pratt dilators.  Then, the hysteroscope was inserted.  A large intracavitary mass was identified. MyoSure blade was inserted through the scope and the large submucous fibroid was resected using the MyoSure.  After resection of the large submucous fibroid, a smaller fibroid was noted on the left uterine wall. This was also resected using the MyoSure blade.  Once the cavity was cleaned, bilateral ostia could be visualized.  The scope was removed from the cervix.  Tenaculum was removed from the cervix, and the cervix was hemostatic.  Speculum was removed.  She was extubated and taken to the recovery room in stable condition.  Sponge, lap, needle, and instrument counts were  correct x2.     Marylynn Pearson, MD     GA/MEDQ  D:  02/07/2014  T:  02/07/2014  Job:  [id number]

## 2014-03-23 ENCOUNTER — Other Ambulatory Visit: Payer: Self-pay

## 2014-04-09 ENCOUNTER — Other Ambulatory Visit: Payer: Self-pay | Admitting: Obstetrics and Gynecology

## 2014-04-09 ENCOUNTER — Telehealth: Payer: Self-pay | Admitting: Neurology

## 2014-04-09 NOTE — Telephone Encounter (Signed)
Patient checking status of referral for Neurosurgeon.  Has additional questions has to why see needs to see Neuro-Ophthalmologist.  Please call and advise.

## 2014-04-10 LAB — CYTOLOGY - PAP

## 2014-04-10 NOTE — Telephone Encounter (Signed)
Called patient. Left a detailed message.

## 2014-04-30 NOTE — Telephone Encounter (Signed)
Patient returning call, has questions regarding endocrinologist which Dr. Jaynee Eagles mentioned, please return call and advise.

## 2014-05-14 ENCOUNTER — Encounter: Payer: Self-pay | Admitting: Neurology

## 2014-05-14 ENCOUNTER — Ambulatory Visit (INDEPENDENT_AMBULATORY_CARE_PROVIDER_SITE_OTHER): Payer: 59 | Admitting: Neurology

## 2014-05-14 VITALS — BP 128/71 | HR 88 | Ht 63.0 in | Wt 240.8 lb

## 2014-05-14 DIAGNOSIS — G518 Other disorders of facial nerve: Secondary | ICD-10-CM

## 2014-05-14 DIAGNOSIS — G40109 Localization-related (focal) (partial) symptomatic epilepsy and epileptic syndromes with simple partial seizures, not intractable, without status epilepticus: Secondary | ICD-10-CM

## 2014-05-14 DIAGNOSIS — G9389 Other specified disorders of brain: Secondary | ICD-10-CM

## 2014-05-14 DIAGNOSIS — D497 Neoplasm of unspecified behavior of endocrine glands and other parts of nervous system: Secondary | ICD-10-CM

## 2014-05-14 DIAGNOSIS — E230 Hypopituitarism: Secondary | ICD-10-CM

## 2014-05-14 DIAGNOSIS — G5139 Clonic hemifacial spasm, unspecified: Secondary | ICD-10-CM

## 2014-05-14 DIAGNOSIS — R220 Localized swelling, mass and lump, head: Secondary | ICD-10-CM

## 2014-05-14 NOTE — Progress Notes (Signed)
[facility]    Provider:  Dr Jaynee Eagles Referring Provider: Marylynn Pearson, MD Primary Care Physician:  Marylynn Pearson, MD  CC:  Vision loss  HPI:  Amanda Murphy is a 33 y.o. female here as a follow up for vision changes. 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 She was referred to neurosurgery however she had too much going on in her life to go. She is here for follow up of her imaging and new left facial twitching. Her blurry vision is worse. On the 21st (she didn't sleep the night before) she started having the facial twitch on the left side. She also didn't sleep again and the next night then it occurred on the next day. Happened twice after not getting any sleep the night before. A day later she took 3 benadryl and had the facial twitch. It lasted for less than a minute. It has not happened again since the 25th of November. The left face uncontrollably blinks and twitches, repetitively, she can't stop it. She brings in video today consistent with her description. No headache at the time. Left eye blinking, head turns, repetitive and stereotyped.  Reviewed images with patient. Had to reorder NSY consult, she understands and she is ready to go. No headaches. No weakness. No other focal neurologic complaints.  History original visit 01/18/2014: 33 year old with a PMHx of migraines, obesity, PCOS who is here for a new appointment to evaluate sudden persistent right eye vision changes since May. The right eye is blurry. Saw an optometrist in June with a normal fundoscopic eye exam. Vision changes started during a migraine and never resolved. Vision changes were sudden, did not develop over a period of hours or days. Is stable, never worsened or progressed. No flickers or flashes of light. Left eye is unaffected. No changes in color either eye. No droopy eyelids, no dysarthria, no dysphagia, no changes in sensation, no weakness, no gait changes, no hearing changes or any other  focal neurologic symptoms. Nothing makes it better, nothing makes it worse. No head trauma. Does have some sinus problems. No new medications at the time. Has not had any imaging of brain. No significant past medical history of or episodes of focal neurologic symptoms. No pain on eye movements. Migraines once a year due to sinuses described as pressure around the whole head, not throbbing, no nausea, no vomiting no light sensitivity. Hasn't had a headache since May. The headache in May was different than her normal headaches, the right side of face was hurting as well with a radiating type pain she can't remember well enough to describe.  Denies diabetes, no high cholesterol, no HTN, no smoking. No FHx of neurologic disorders. No FHx of stroke. Has an OBGYN Dr. Kathryne Eriksson checks bloodwork yearly including diabetes and cholersterol  Had a visual evoked potential. And formal Visual field testing in June.   Reviewed notes, labs and imaging from outside physicians, which showed:  11/29/2013: OD 20/80, OS 20/30, normal slit lamp examination, normal fundoscopic exam,  12/05/2013: OD 20/70, OS 20/25, PERRL, RT hemi VF defect on [facility] Visual Field testing, normal slit lamp exam. Normal pressure OD 21 OS 17, normal fundoscopic exam,  12/20/2013: OD 20/60, OS 20/30, IOP OD15 OS 12, OD left hemi and OS rt quad on Humphrey Visual Field testing, slit lmp normal, fundoscopic exam nml,   Review of Systems: Patient complains of symptoms per HPI as well as the following symptoms blurred vision, no CP, no SOB,  no weakness, no joint pain. Pertinent negatives per HPI. All others negative.   History   Social History  . Marital Status: Single    Spouse Name: N/A    Number of Children: 0  . Years of Education: BSN   Occupational History  . [employer] History Main Topics  . Smoking status: Never Smoker   . Smokeless tobacco: Never Used  . Alcohol Use: 0.0 oz/week    0 Not  specified per week     Comment: occasional  . Drug Use: No  . Sexual Activity: Yes    Birth Control/ Protection: Pill   Other Topics Concern  . Not on file   Social History Narrative   Patient is single with no children.   Patient is right handed.   Patient has a Copywriter, advertising.   Patient drinks coffee 3 cups a week, tea once daily.    Family History  Problem Relation Age of Onset  . Hypertension Mother   . Diabetes Mother   . Ovarian cancer    . Breast cancer    . Fibroids      Past Medical History  Diagnosis Date  . Vision abnormalities   . Headache(784.0)   . PCOS (polycystic ovarian syndrome)   . Fibroids   . Astigmatism     Past Surgical History  Procedure Laterality Date  . Robotic assisted laparoscopic vaginal hysterectomy with fibroid removal  02/07/2014    [facility]  . Wisdom tooth extraction  2006    Current Outpatient Prescriptions  Medication Sig Dispense Refill  . docusate sodium (COLACE) 100 MG capsule Take 100 mg by mouth 2 (two) times daily as needed for mild constipation.    [name] etonogestrel-ethinyl estradiol (NUVARING) 0.12-0.015 MG/24HR vaginal ring Place 1 each vaginally every 28 (twenty-eight) days. Insert vaginally and leave in place for 3 consecutive weeks, then remove for 1 week.    . Multiple Vitamin (MULTIVITAMIN WITH MINERALS) TABS tablet Take 1 tablet by mouth daily.    . naproxen sodium (ANAPROX) 220 MG tablet Take 440 mg by mouth daily as needed.    . phentermine 37.5 MG capsule Take 37.5 mg by mouth every morning.     No current facility-administered medications for this visit.    Allergies as of 05/14/2014  . (No Known Allergies)    Vitals: BP 128/71 mmHg  Pulse 88  Ht 5\' 3"  (1.6 m)  Wt 240 lb 12.8 oz (109.226 kg)  BMI 42.67 kg/m2 Last Weight:  Wt Readings from Last 1 Encounters:  05/14/14 240 lb 12.8 oz (109.226 kg)   Last Height:   Ht Readings from Last 1 Encounters:  05/14/14 5\' 3"  (1.6 m)   Physical exam: Exam: Gen: NAD,  conversant Eyes: anicteric sclerae, moist conjunctivae HENT: Atraumatic, oropharynx clear Neck: Trachea midline; supple,   CV: RRR, no MRG  Psych: Appropriate affect, pleasant  Neuro: MS: AA&Ox3, appropriately interactive, normal affect   Speech: fluent w/o paraphasic error  Memory: good recent and remote recall  CN: PERRL, EOMI no nystagmus, Fundi Flat, No APD, VA 20/200 right corrected, 20/70 left corrected, no ptosis, sensation intact to LT V1-V3 bilat, face symmetric, no weakness, hearing grossly intact, palate elevates symmetrically, shoulder shrug 5/5 bilat, tongue protrudes midline, no fasiculations noted. Right upper quadrant visual defect to confrontation.  Motor: normal bulk and tone Strength: 5/5 In all extremities  Coord: rapid alternating and point-to-point (FNF, HTS) movements intact.  Reflexes: symmetrical, bilat downgoing toes  Sens:  LT intact in all extremities  Gait: posture, stance, stride and arm-swing normal. Tandem gait intact. Able to walk on heels and toes. Romberg absent.   Assessment and plan: 33 year old patient with acute, persistent, worsening monocular temporal superior right quadrant visual defect. Neurologic exam is otherwise normal including fundoscopic exam. Symptoms occurred 6 months ago in the setting of a headache and have remained stable. No other inciting factors, no risk factors for stroke other than obesity (denies DM, HTN, HLD, smoking or other vascular risk factors). 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 She was referred to neurosurgery however she had too much going on in her life to go. She is here for follow up on her imaging and new left facial twitching. The left face uncontrollably blinks and twitches, repetitively, she can't stop it. She brings in video today consistent with her description. Left eye blinking, head turns, repetitive and stereotyped, clonic movements.    Will reorder NSY. Stressed again  the importance of NSY evaluation and treatment and risk of permanent vision loss and/or other significant sequelae Reviewed MRI images of the brain with patient.  Ordered EEG (hemifacial spasm vs focal seizure) Mass appears distinct from the pituitary but will order labs to assess pituitary function  Sarina Ill, MD  [facility] [address] [place], [place] [address]  Phone [phone] Fax [phone]

## 2014-05-15 ENCOUNTER — Ambulatory Visit (INDEPENDENT_AMBULATORY_CARE_PROVIDER_SITE_OTHER): Payer: 59 | Admitting: Neurology

## 2014-05-15 ENCOUNTER — Encounter: Payer: Self-pay | Admitting: Neurology

## 2014-05-15 ENCOUNTER — Telehealth: Payer: Self-pay | Admitting: *Deleted

## 2014-05-15 DIAGNOSIS — R220 Localized swelling, mass and lump, head: Secondary | ICD-10-CM

## 2014-05-15 DIAGNOSIS — G9389 Other specified disorders of brain: Secondary | ICD-10-CM | POA: Insufficient documentation

## 2014-05-15 DIAGNOSIS — G40109 Localization-related (focal) (partial) symptomatic epilepsy and epileptic syndromes with simple partial seizures, not intractable, without status epilepticus: Secondary | ICD-10-CM

## 2014-05-15 DIAGNOSIS — G5139 Clonic hemifacial spasm, unspecified: Secondary | ICD-10-CM | POA: Insufficient documentation

## 2014-05-15 NOTE — Telephone Encounter (Signed)
Left patient a message to come by the office to pick up imaging cd before appt at neurosurgery.

## 2014-05-15 NOTE — Procedures (Signed)
    History:  Amanda Murphy is a 33 year old patient with a history of a suprasellar mass, and episodes of left facial twitching or spasm. The patient is being evaluated for possible seizures.  This is a routine EEG. No skull defects are noted. Medications include Colace, multivitamins, naproxen, and phentermine.   EEG classification: Normal awake and drowsy  Description of the recording: The background rhythms of this recording consists of a fairly well modulated medium amplitude alpha rhythm of 9 Hz that is reactive to eye opening and closure. As the record progresses, the patient appears to remain in the waking state throughout the recording. Photic stimulation was performed, resulting in a bilateral and symmetric photic driving response. Hyperventilation was also performed, resulting in a minimal buildup of the background rhythm activities without significant slowing seen. Toward the end of the recording, the patient enters the drowsy state with slight symmetric slowing seen. The patient never enters stage II sleep. At no time during the recording does there appear to be evidence of spike or spike wave discharges or evidence of focal slowing. EKG monitor shows no evidence of cardiac rhythm abnormalities with a heart rate of 60.  Impression: This is a normal EEG recording in the waking and drowsy state. No evidence of ictal or interictal discharges are seen.

## 2014-05-16 ENCOUNTER — Telehealth: Payer: Self-pay | Admitting: *Deleted

## 2014-05-16 NOTE — Telephone Encounter (Signed)
Left voice message with normal EEG, no seizure activity, call back with any questions or concerns.

## 2014-05-16 NOTE — Telephone Encounter (Signed)
-----   Message from Melvenia Beam, MD sent at 05/15/2014  5:51 PM EST ----- Please let patient know her EEG was normal, no seizure activity

## 2014-05-24 ENCOUNTER — Other Ambulatory Visit (HOSPITAL_COMMUNITY): Payer: Self-pay | Admitting: Neurosurgery

## 2014-05-24 DIAGNOSIS — D497 Neoplasm of unspecified behavior of endocrine glands and other parts of nervous system: Secondary | ICD-10-CM

## 2014-06-08 DIAGNOSIS — Z86718 Personal history of other venous thrombosis and embolism: Secondary | ICD-10-CM

## 2014-06-08 HISTORY — DX: Personal history of other venous thrombosis and embolism: Z86.718

## 2014-06-14 ENCOUNTER — Ambulatory Visit (HOSPITAL_COMMUNITY): Admission: RE | Admit: 2014-06-14 | Payer: 59 | Source: Ambulatory Visit

## 2014-06-29 ENCOUNTER — Ambulatory Visit (HOSPITAL_COMMUNITY): Admission: RE | Admit: 2014-06-29 | Payer: 59 | Source: Ambulatory Visit

## 2014-07-13 ENCOUNTER — Ambulatory Visit (HOSPITAL_COMMUNITY)
Admission: RE | Admit: 2014-07-13 | Discharge: 2014-07-13 | Disposition: A | Discharge status: Home / Self Care | Payer: 59 | Source: Ambulatory Visit | Attending: Neurosurgery | Admitting: Neurosurgery

## 2014-07-13 DIAGNOSIS — R220 Localized swelling, mass and lump, head: Secondary | ICD-10-CM | POA: Insufficient documentation

## 2014-07-13 DIAGNOSIS — D497 Neoplasm of unspecified behavior of endocrine glands and other parts of nervous system: Secondary | ICD-10-CM

## 2014-07-13 DIAGNOSIS — D443 Neoplasm of uncertain behavior of pituitary gland: Secondary | ICD-10-CM | POA: Diagnosis present

## 2014-07-13 MED ORDER — GADOBENATE DIMEGLUMINE 529 MG/ML IV SOLN
10.0000 mL | Freq: Once | INTRAVENOUS | Status: AC
  Administered 2014-07-13: 10 mL via INTRAVENOUS

## 2014-08-16 ENCOUNTER — Ambulatory Visit (INDEPENDENT_AMBULATORY_CARE_PROVIDER_SITE_OTHER): Payer: 59 | Admitting: Internal Medicine

## 2014-08-16 ENCOUNTER — Encounter: Payer: Self-pay | Admitting: Internal Medicine

## 2014-08-16 VITALS — BP 118/84 | HR 77 | Temp 98.2°F | Resp 16 | Ht 63.0 in | Wt 238.0 lb

## 2014-08-16 DIAGNOSIS — J309 Allergic rhinitis, unspecified: Secondary | ICD-10-CM | POA: Insufficient documentation

## 2014-08-16 DIAGNOSIS — J3089 Other allergic rhinitis: Secondary | ICD-10-CM

## 2014-08-16 MED ORDER — FLUTICASONE PROPIONATE 50 MCG/ACT NA SUSP: 2.0000 | Freq: Every day | NASAL | Status: DC

## 2014-08-16 MED ORDER — LEVOCETIRIZINE DIHYDROCHLORIDE 5 MG PO TABS: 5.0000 mg | ORAL_TABLET | Freq: Every evening | ORAL | Status: DC

## 2014-08-16 NOTE — Progress Notes (Signed)
Pre visit review using our clinic review tool, if applicable. No additional management support is needed unless otherwise documented below in the visit note. 

## 2014-08-16 NOTE — Assessment & Plan Note (Signed)
Rx for xyzal and flonase. Advised her to use both, when improved continue with one through her allergy season.

## 2014-08-16 NOTE — Patient Instructions (Signed)
We have sent in a nose spray called flonase. Use 2 puffs in each nostril daily to help the sinuses dry up. We have also sent in xyzal that you can take 1 pill a day for the sinuses.  I would take both for the next 3-5 days to help jump start things. Then you can switch to taking only one until you are past the pollen season.   Call us if you are not feeling better in 1-2 weeks.   Allergic Rhinitis Allergic rhinitis is when the mucous membranes in the nose respond to allergens. Allergens are particles in the air that cause your body to have an allergic reaction. This causes you to release allergic antibodies. Through a chain of events, these eventually cause you to release histamine into the blood stream. Although meant to protect the body, it is this release of histamine that causes your discomfort, such as frequent sneezing, congestion, and an itchy, runny nose.  CAUSES  Seasonal allergic rhinitis (hay fever) is caused by pollen allergens that may come from grasses, trees, and weeds. Year-round allergic rhinitis (perennial allergic rhinitis) is caused by allergens such as house dust mites, pet dander, and mold spores.  SYMPTOMS   Nasal stuffiness (congestion).  Itchy, runny nose with sneezing and tearing of the eyes. DIAGNOSIS  Your health care provider can help you determine the allergen or allergens that trigger your symptoms. If you and your health care provider are unable to determine the allergen, skin or blood testing may be used. TREATMENT  Allergic rhinitis does not have a cure, but it can be controlled by:  Medicines and allergy shots (immunotherapy).  Avoiding the allergen. Hay fever may often be treated with antihistamines in pill or nasal spray forms. Antihistamines block the effects of histamine. There are over-the-counter medicines that may help with nasal congestion and swelling around the eyes. Check with your health care provider before taking or giving this medicine.  If  avoiding the allergen or the medicine prescribed do not work, there are many new medicines your health care provider can prescribe. Stronger medicine may be used if initial measures are ineffective. Desensitizing injections can be used if medicine and avoidance does not work. Desensitization is when a patient is given ongoing shots until the body becomes less sensitive to the allergen. Make sure you follow up with your health care provider if problems continue. HOME CARE INSTRUCTIONS It is not possible to completely avoid allergens, but you can reduce your symptoms by taking steps to limit your exposure to them. It helps to know exactly what you are allergic to so that you can avoid your specific triggers. SEEK MEDICAL CARE IF:   You have a fever.  You develop a cough that does not stop easily (persistent).  You have shortness of breath.  You start wheezing.  Symptoms interfere with normal daily activities. Document Released: 02/17/2001 Document Revised: 05/30/2013 Document Reviewed: 01/30/2013 [facility] Patient Information 2015 [place], [place]. This information is not intended to replace advice given to you by your health care provider. Make sure you discuss any questions you have with your health care provider.

## 2014-08-16 NOTE — Progress Notes (Signed)
   Subjective:    Patient ID: Amanda Murphy, female    DOB: Aug 02, 1980, 34 y.o.   MRN: [id number]  HPI The patient is a 34 YO female coming in as new patient for acute visit. She is not able to wait until her new patient visit. She is having sinus troubles since end of February. Started off with congestion and sinus tenderness. Denies fevers, chills. Denies ear pain or drainage. No sinus tenderness the last week. Her congestion is overall getting better. She has been taking allegra D with some success the last week. She has taken xyzal in the past which has been very effective. She normally has problems with allergies in Feb-end of March. Denies cough, sore throat, SOB, chest pains.   Review of Systems  Constitutional: Negative for fever, chills, activity change, appetite change, fatigue and unexpected weight change.  HENT: Positive for congestion, postnasal drip and rhinorrhea. Negative for ear discharge, ear pain, nosebleeds, sinus pressure, sore throat, trouble swallowing and voice change.   Eyes: Negative.   Respiratory: Negative for cough, chest tightness, shortness of breath and wheezing.   Cardiovascular: Negative for chest pain, palpitations and leg swelling.  Gastrointestinal: Negative.       Objective:   Physical Exam  Constitutional: She is oriented to person, place, and time. She appears well-developed and well-nourished.  HENT:  Head: Normocephalic and atraumatic.  Right Ear: External ear normal.  Left Ear: External ear normal.  Oropharynx red without purulent drainage, turbinates red and swollen.   Eyes: EOM are normal.  Neck: Normal range of motion. No thyromegaly present.  Cardiovascular: Normal rate and regular rhythm.   Pulmonary/Chest: Effort normal and breath sounds normal. No respiratory distress. She has no wheezes.  Abdominal: Soft.  Lymphadenopathy:    She has no cervical adenopathy.  Neurological: She is alert and oriented to person, place, and time.  Skin:  Skin is warm and dry.   Filed Vitals:   08/16/14 0918  BP: 118/84  Pulse: 77  Temp: 98.2 F (36.8 C)  TempSrc: Oral  Resp: 16  Height: 5\' 3"  (1.6 m)  Weight: 238 lb (107.956 kg)  SpO2: 98%      Assessment & Plan:

## 2014-08-28 ENCOUNTER — Other Ambulatory Visit (HOSPITAL_COMMUNITY): Payer: Self-pay | Admitting: Neurosurgery

## 2014-09-19 ENCOUNTER — Other Ambulatory Visit (INDEPENDENT_AMBULATORY_CARE_PROVIDER_SITE_OTHER): Payer: 59

## 2014-09-19 ENCOUNTER — Encounter: Payer: Self-pay | Admitting: Internal Medicine

## 2014-09-19 ENCOUNTER — Ambulatory Visit (INDEPENDENT_AMBULATORY_CARE_PROVIDER_SITE_OTHER): Payer: 59 | Admitting: Internal Medicine

## 2014-09-19 VITALS — BP 110/76 | HR 83 | Temp 98.6°F | Resp 14 | Ht 63.0 in | Wt 241.4 lb

## 2014-09-19 DIAGNOSIS — Z7184 Encounter for health counseling related to travel: Secondary | ICD-10-CM

## 2014-09-19 DIAGNOSIS — Z23 Encounter for immunization: Secondary | ICD-10-CM | POA: Diagnosis not present

## 2014-09-19 DIAGNOSIS — G9389 Other specified disorders of brain: Secondary | ICD-10-CM

## 2014-09-19 DIAGNOSIS — R220 Localized swelling, mass and lump, head: Secondary | ICD-10-CM | POA: Diagnosis not present

## 2014-09-19 DIAGNOSIS — Z0000 Encounter for general adult medical examination without abnormal findings: Secondary | ICD-10-CM

## 2014-09-19 DIAGNOSIS — J3089 Other allergic rhinitis: Secondary | ICD-10-CM | POA: Diagnosis not present

## 2014-09-19 DIAGNOSIS — Z7189 Other specified counseling: Secondary | ICD-10-CM | POA: Diagnosis not present

## 2014-09-19 DIAGNOSIS — Z299 Encounter for prophylactic measures, unspecified: Secondary | ICD-10-CM

## 2014-09-19 LAB — COMPREHENSIVE METABOLIC PANEL
ALT: 13 U/L (ref 0–35)
AST: 16 U/L (ref 0–37)
Albumin: 3.3 g/dL — ABNORMAL LOW (ref 3.5–5.2)
Alkaline Phosphatase: 70 U/L (ref 39–117)
BUN: 12 mg/dL (ref 6–23)
CALCIUM: 9.3 mg/dL (ref 8.4–10.5)
CHLORIDE: 103 meq/L (ref 96–112)
CO2: 27 mEq/L (ref 19–32)
CREATININE: 0.97 mg/dL (ref 0.40–1.20)
GFR: 83.92 mL/min (ref >60.00)
GLUCOSE: 90 mg/dL (ref 70–99)
Potassium: 4.3 mEq/L (ref 3.5–5.1)
Sodium: 136 mEq/L (ref 135–145)
Total Bilirubin: 0.3 mg/dL (ref 0.2–1.2)
Total Protein: 7.2 g/dL (ref 6.0–8.3)

## 2014-09-19 LAB — CBC
HCT: 37.2 % (ref 36.0–46.0)
HEMOGLOBIN: 12.1 g/dL (ref 12.0–15.0)
MCHC: 32.6 g/dL (ref 30.0–36.0)
MCV: 83.5 fl (ref 78.0–100.0)
Platelets: 396 10*3/uL (ref 150.0–400.0)
RBC: 4.45 Mil/uL (ref 3.87–5.11)
RDW: 17.6 % — ABNORMAL HIGH (ref 11.5–15.5)
WBC: 7.2 10*3/uL (ref 4.0–10.5)

## 2014-09-19 LAB — LIPID PANEL
CHOL/HDL RATIO: 2
Cholesterol: 171 mg/dL (ref 0–200)
HDL: 77.8 mg/dL (ref >39.00)
LDL CALC: 69 mg/dL (ref 0–99)
NONHDL: 93.2
Triglycerides: 119 mg/dL (ref 0.0–149.0)
VLDL: 23.8 mg/dL (ref 0.0–40.0)

## 2014-09-19 LAB — VITAMIN D 25 HYDROXY (VIT D DEFICIENCY, FRACTURES): VITD: 27.65 ng/mL — ABNORMAL LOW (ref 30.00–100.00)

## 2014-09-19 LAB — HEMOGLOBIN A1C: Hgb A1c MFr Bld: 5.8 % (ref 4.6–6.5)

## 2014-09-19 MED ORDER — CIPROFLOXACIN HCL 500 MG PO TABS: 500.0000 mg | ORAL_TABLET | Freq: Two times a day (BID) | ORAL | Status: DC

## 2014-09-19 NOTE — Progress Notes (Signed)
Pre visit review using our clinic review tool, if applicable. No additional management support is needed unless otherwise documented below in the visit note. 

## 2014-09-21 DIAGNOSIS — Z7184 Encounter for health counseling related to travel: Secondary | ICD-10-CM | POA: Insufficient documentation

## 2014-09-21 NOTE — Assessment & Plan Note (Signed)
She is doing well at this time and much improved. Still using the xyzal and stopped flonase when maximum symptoms are gone.

## 2014-09-21 NOTE — Assessment & Plan Note (Signed)
Patient planning to go to [place] in 1 month. Never had hepatitis A so given first shot today. Told her that she is due for a second injection in 1 year. Reviewed CDC website about needs and given rx for ciprofloxacin for traveler's diarrhea.

## 2014-09-21 NOTE — Progress Notes (Signed)
   Subjective:    Patient ID: Amanda Murphy, female    DOB: 1981/01/19, 34 y.o.   MRN: [id number]  HPI The patient is a 34 YO female who is coming in today with several concerns. She is going to [place] in about 1 month and wants to know if she needs vaccinations. She is not going anywhere rural and is not doing [place] work. She has had all normal immunizations as a child.   She is doing well with her allergies and is still taking xyzal. She stopped taking flonase when her symptoms improved. Mild congestion and post nasal drip resolved. No fevers or chills, no SOB or cough.   Her second concern is about her suprasellar mass. It was found some time ago and she was supposed to have surgery on it but delayed due to insurance. Now she is delaying because of her trip. She is concerned about side effects from the surgery. She is having some intermittent blurred vision and some mild headaches. Denies current headache.   Review of Systems  Constitutional: Negative for fever, chills, activity change, appetite change, fatigue and unexpected weight change.  HENT: Negative for ear discharge, ear pain, nosebleeds, sinus pressure, sore throat, trouble swallowing and voice change.   Eyes: Positive for visual disturbance.  Respiratory: Negative for cough, chest tightness, shortness of breath and wheezing.   Cardiovascular: Negative for chest pain, palpitations and leg swelling.  Gastrointestinal: Negative.   Neurological: Positive for headaches.      Objective:   Physical Exam  Constitutional: She is oriented to person, place, and time. She appears well-developed and well-nourished.  HENT:  Head: Normocephalic and atraumatic.  Eyes: EOM are normal.  Neck: Normal range of motion. No thyromegaly present.  Cardiovascular: Normal rate and regular rhythm.   Pulmonary/Chest: Effort normal and breath sounds normal. No respiratory distress. She has no wheezes.  Abdominal: Soft.  Lymphadenopathy:    She  has no cervical adenopathy.  Neurological: She is alert and oriented to person, place, and time.  Skin: Skin is warm and dry.   Filed Vitals:   09/19/14 1015  BP: 110/76  Pulse: 83  Temp: 98.6 F (37 C)  TempSrc: Oral  Resp: 14  Height: 5\' 3"  (1.6 m)  Weight: 241 lb 6.4 oz (109.498 kg)  SpO2: 97%      Assessment & Plan:  Hepatitis A given at today's visit.

## 2014-09-21 NOTE — Assessment & Plan Note (Signed)
She is supposed to have surgery, talked to her about possible risk including growth of the lesion causing compression. She is aware and does seriously plan to get the surgery. No progression to her symptoms to suggest significant growth.

## 2014-10-18 ENCOUNTER — Other Ambulatory Visit (HOSPITAL_COMMUNITY): Payer: Self-pay | Admitting: *Deleted

## 2014-10-18 NOTE — Pre-Procedure Instructions (Signed)
Amanda Murphy  10/18/2014   Your procedure is scheduled on:  Wednesday, Oct 31, 2014 at 8:30 AM.   Report to [facility] Entrance "A" Admitting Office at 6:30 AM.   Call this number if you have problems the morning of surgery: [phone]               Any questions prior to day of surgery, please call [phone] between 8 & 4 PM.    Remember:   Do not eat food or drink liquids after midnight Tuesday, 10/30/14.   Take these medicines the morning of surgery with A SIP OF WATER: Flonase - if needed, Xyzal - if needed.  Stop Vitamins and NSAIDs (Naproxen, Aleve, Ibuprofen) 7 days prior to surgery.   Do not wear jewelry, make-up or nail polish.  Do not wear lotions, powders, or perfumes. You may wear deodorant.  Do not shave 48 hours prior to surgery.   Do not bring valuables to the hospital.  [facility] is not responsible                  for any belongings or valuables.               Contacts, dentures or bridgework may not be worn into surgery.  Leave suitcase in the car. After surgery it may be brought to your room.  For patients admitted to the hospital, discharge time is determined by your                treatment team.               Special Instructions: See "Preparing for Surgery" Instruction sheet.    Please read over the following fact sheets that you were given: Pain Booklet, Coughing and Deep Breathing, Blood Transfusion Information and Surgical Site Infection Prevention

## 2014-10-19 ENCOUNTER — Encounter: Payer: Self-pay | Admitting: Internal Medicine

## 2014-10-19 ENCOUNTER — Encounter (HOSPITAL_COMMUNITY)
Admission: RE | Admit: 2014-10-19 | Discharge: 2014-10-19 | Disposition: A | Discharge status: Home / Self Care | Payer: 59 | Source: Ambulatory Visit | Attending: Neurosurgery | Admitting: Neurosurgery

## 2014-10-19 ENCOUNTER — Encounter (HOSPITAL_COMMUNITY): Payer: Self-pay

## 2014-10-19 ENCOUNTER — Ambulatory Visit (INDEPENDENT_AMBULATORY_CARE_PROVIDER_SITE_OTHER): Payer: 59 | Admitting: Internal Medicine

## 2014-10-19 VITALS — BP 110/70 | HR 77 | Temp 97.9°F | Resp 16 | Wt 244.0 lb

## 2014-10-19 DIAGNOSIS — F418 Other specified anxiety disorders: Secondary | ICD-10-CM | POA: Diagnosis not present

## 2014-10-19 DIAGNOSIS — R220 Localized swelling, mass and lump, head: Secondary | ICD-10-CM | POA: Insufficient documentation

## 2014-10-19 DIAGNOSIS — Z01812 Encounter for preprocedural laboratory examination: Secondary | ICD-10-CM | POA: Diagnosis present

## 2014-10-19 HISTORY — DX: Unspecified asthma, uncomplicated: J45.909

## 2014-10-19 HISTORY — DX: Pneumonia, unspecified organism: J18.9

## 2014-10-19 LAB — BASIC METABOLIC PANEL
Anion gap: 12 (ref 5–15)
BUN: 10 mg/dL (ref 6–20)
CO2: 21 mmol/L — ABNORMAL LOW (ref 22–32)
Calcium: 9 mg/dL (ref 8.9–10.3)
Chloride: 103 mmol/L (ref 101–111)
Creatinine, Ser: 0.97 mg/dL (ref 0.44–1.00)
GFR calc Af Amer: >60 mL/min (ref >60)
GFR calc non Af Amer: >60 mL/min (ref >60)
Glucose, Bld: 114 mg/dL — ABNORMAL HIGH (ref 65–99)
Potassium: 3.6 mmol/L (ref 3.5–5.1)
Sodium: 136 mmol/L (ref 135–145)

## 2014-10-19 LAB — CBC
HEMATOCRIT: 36.5 % (ref 36.0–46.0)
HEMOGLOBIN: 12.1 g/dL (ref 12.0–15.0)
MCH: 27.9 pg (ref 26.0–34.0)
MCHC: 33.2 g/dL (ref 30.0–36.0)
MCV: 84.1 fL (ref 78.0–100.0)
PLATELETS: 388 10*3/uL (ref 150–400)
RBC: 4.34 MIL/uL (ref 3.87–5.11)
RDW: 16.5 % — AB (ref 11.5–15.5)
WBC: 6.5 10*3/uL (ref 4.0–10.5)

## 2014-10-19 LAB — HCG, SERUM, QUALITATIVE: Preg, Serum: NEGATIVE

## 2014-10-19 MED ORDER — ALPRAZOLAM 0.5 MG PO TABS: 0.5000 mg | ORAL_TABLET | Freq: Every evening | ORAL | Status: DC | PRN

## 2014-10-19 NOTE — Progress Notes (Signed)
   Subjective:    Patient ID: Amanda Murphy, female    DOB: [birth year], 34 y.o.   MRN: [id number]  HPI The patient is a 34 YO female who is coming in for anxiety. She is about to fly to [place] and is concerned about the 20+ hour trip. She does get anxious in tight spaces. She is okay to fly short distances but thinks these long flights will be too much. She is getting anxious thinking about it. Denies panic attacks in the past. She is also slightly anxious about her brain surgery after her trip.  Review of Systems  Constitutional: Negative for fever, activity change, appetite change, fatigue and unexpected weight change.  Respiratory: Negative.   Cardiovascular: Negative.   Gastrointestinal: Negative.   Psychiatric/Behavioral: The patient is nervous/anxious.       Objective:   Physical Exam  Constitutional: She appears well-developed and well-nourished.  HENT:  Head: Normocephalic and atraumatic.  Cardiovascular: Normal rate and regular rhythm.   Pulmonary/Chest: Effort normal and breath sounds normal. No respiratory distress. She has no wheezes. She has no rales.  Abdominal: Soft. She exhibits no distension.  Skin: Skin is warm and dry.   Filed Vitals:   10/19/14 1052  BP: 110/70  Pulse: 77  Temp: 97.9 F (36.6 C)  TempSrc: Oral  Resp: 16  Weight: 244 lb (110.678 kg)  SpO2: 98%      Assessment & Plan:

## 2014-10-19 NOTE — Patient Instructions (Signed)
We have given you some xanax in case you need it to travel. It lasts about 4-6 hours in your system so do not take it more often than every 6 hours.   I hope you have a great time on your trip!!

## 2014-10-19 NOTE — Progress Notes (Signed)
Pre visit review using our clinic review tool, if applicable. No additional management support is needed unless otherwise documented below in the visit note. 

## 2014-10-19 NOTE — Progress Notes (Signed)
PATIENT WILL NEED T+S DRAWN DOS.  SHE IS TRAVELING TO [place] AND WILL NOT BE ABLE TO WEAR BRACELET.

## 2014-10-19 NOTE — Assessment & Plan Note (Signed)
Okay with xanax prn for travel and before her surgery. Talked to her about the risks including confusion, increased risk of fall. Talked with her about the habit forming nature of this medication and advised her to only use as needed. She understands.

## 2014-10-30 MED ORDER — CEFAZOLIN SODIUM-DEXTROSE 2-3 GM-% IV SOLR
2.0000 g | INTRAVENOUS | Status: AC
  Administered 2014-10-31: 2 g via INTRAVENOUS

## 2014-10-30 NOTE — Anesthesia Preprocedure Evaluation (Addendum)
Anesthesia Evaluation  Patient identified by MRN, date of birth, ID band Patient awake    Reviewed: Allergy & Precautions, NPO status , Patient's Chart, lab work & pertinent test results  History of Anesthesia Complications Negative for: history of anesthetic complications  Airway Mallampati: II  TM Distance: >3 FB Neck ROM: Full    Dental no notable dental hx. (+) Dental Advisory Given   Pulmonary asthma ,  breath sounds clear to auscultation  Pulmonary exam normal       Cardiovascular negative cardio ROS Normal cardiovascular examRhythm:Regular Rate:Normal     Neuro/Psych  Headaches, PSYCHIATRIC DISORDERS Anxiety    GI/Hepatic negative GI ROS, Neg liver ROS,   Endo/Other  Morbid obesity  Renal/GU negative Renal ROS  negative genitourinary   Musculoskeletal negative musculoskeletal ROS (+)   Abdominal   Peds negative pediatric ROS (+)  Hematology negative hematology ROS (+)   Anesthesia Other Findings   Reproductive/Obstetrics negative OB ROS                            Anesthesia Physical Anesthesia Plan  ASA: III  Anesthesia Plan: General   Post-op Pain Management:    Induction: Intravenous  Airway Management Planned: Oral ETT  Additional Equipment: Arterial line  Intra-op Plan:   Post-operative Plan: Extubation in OR  Informed Consent: I have reviewed the patients History and Physical, chart, labs and discussed the procedure including the risks, benefits and alternatives for the proposed anesthesia with the patient or authorized representative who has indicated his/her understanding and acceptance.   Dental advisory given  Plan Discussed with: CRNA  Anesthesia Plan Comments:         Anesthesia Quick Evaluation

## 2014-10-31 ENCOUNTER — Inpatient Hospital Stay (HOSPITAL_COMMUNITY)
Admission: RE | Admit: 2014-10-31 | Discharge: 2014-11-06 | DRG: 982 | Disposition: A | Discharge status: Home / Self Care | Payer: 59 | Source: Ambulatory Visit | Attending: Neurosurgery | Admitting: Neurosurgery

## 2014-10-31 ENCOUNTER — Inpatient Hospital Stay (HOSPITAL_COMMUNITY): Payer: 59 | Admitting: Anesthesiology

## 2014-10-31 ENCOUNTER — Encounter (HOSPITAL_COMMUNITY): Payer: Self-pay | Admitting: *Deleted

## 2014-10-31 ENCOUNTER — Encounter (HOSPITAL_COMMUNITY)
Admission: RE | Disposition: A | Discharge status: Home / Self Care | Payer: Self-pay | Source: Ambulatory Visit | Attending: Neurosurgery

## 2014-10-31 DIAGNOSIS — R51 Headache: Secondary | ICD-10-CM | POA: Diagnosis present

## 2014-10-31 DIAGNOSIS — H5347 Heteronymous bilateral field defects: Secondary | ICD-10-CM | POA: Diagnosis present

## 2014-10-31 DIAGNOSIS — Z791 Long term (current) use of non-steroidal anti-inflammatories (NSAID): Secondary | ICD-10-CM | POA: Diagnosis not present

## 2014-10-31 DIAGNOSIS — Z8041 Family history of malignant neoplasm of ovary: Secondary | ICD-10-CM | POA: Diagnosis not present

## 2014-10-31 DIAGNOSIS — Z6841 Body Mass Index (BMI) 40.0 and over, adult: Secondary | ICD-10-CM

## 2014-10-31 DIAGNOSIS — Z803 Family history of malignant neoplasm of breast: Secondary | ICD-10-CM | POA: Diagnosis not present

## 2014-10-31 DIAGNOSIS — D496 Neoplasm of unspecified behavior of brain: Secondary | ICD-10-CM | POA: Diagnosis present

## 2014-10-31 DIAGNOSIS — Z79899 Other long term (current) drug therapy: Secondary | ICD-10-CM

## 2014-10-31 DIAGNOSIS — D444 Neoplasm of uncertain behavior of craniopharyngeal duct: Principal | ICD-10-CM | POA: Diagnosis present

## 2014-10-31 HISTORY — PX: CRANIOTOMY: SHX93

## 2014-10-31 LAB — TYPE AND SCREEN
ABO/RH(D): A POS
Antibody Screen: NEGATIVE

## 2014-10-31 LAB — POCT I-STAT 7, (LYTES, BLD GAS, ICA,H+H)
Acid-base deficit: 4 mmol/L — ABNORMAL HIGH (ref 0.0–2.0)
Acid-base deficit: 5 mmol/L — ABNORMAL HIGH (ref 0.0–2.0)
Bicarbonate: 21.1 mEq/L (ref 20.0–24.0)
Bicarbonate: 21.7 mEq/L (ref 20.0–24.0)
Calcium, Ion: 1.15 mmol/L (ref 1.12–1.23)
Calcium, Ion: 1.14 mmol/L (ref 1.12–1.23)
HCT: 35 % — ABNORMAL LOW (ref 36.0–46.0)
HCT: 35 % — ABNORMAL LOW (ref 36.0–46.0)
Hemoglobin: 11.9 g/dL — ABNORMAL LOW (ref 12.0–15.0)
Hemoglobin: 11.9 g/dL — ABNORMAL LOW (ref 12.0–15.0)
O2 Saturation: 91 %
O2 Saturation: 94 %
Potassium: 3.6 mmol/L (ref 3.5–5.1)
Potassium: 3.1 mmol/L — ABNORMAL LOW (ref 3.5–5.1)
Sodium: 137 mmol/L (ref 135–145)
Sodium: 138 mmol/L (ref 135–145)
TCO2: 22 mmol/L (ref 0–100)
TCO2: 23 mmol/L (ref 0–100)
pCO2 arterial: 39.3 mmHg (ref 35.0–45.0)
pCO2 arterial: 44.6 mmHg (ref 35.0–45.0)
pH, Arterial: 7.338 — ABNORMAL LOW (ref 7.350–7.450)
pH, Arterial: 7.295 — ABNORMAL LOW (ref 7.350–7.450)
pO2, Arterial: 65 mmHg — ABNORMAL LOW (ref 80.0–100.0)
pO2, Arterial: 79 mmHg — ABNORMAL LOW (ref 80.0–100.0)

## 2014-10-31 LAB — BASIC METABOLIC PANEL
Anion gap: 8 (ref 5–15)
BUN: 7 mg/dL (ref 6–20)
CO2: 22 mmol/L (ref 22–32)
Calcium: 9 mg/dL (ref 8.9–10.3)
Chloride: 121 mmol/L — ABNORMAL HIGH (ref 101–111)
Creatinine, Ser: 0.95 mg/dL (ref 0.44–1.00)
GFR calc Af Amer: >60 mL/min (ref >60)
GFR calc non Af Amer: >60 mL/min (ref >60)
Glucose, Bld: 155 mg/dL — ABNORMAL HIGH (ref 65–99)
Potassium: 3.9 mmol/L (ref 3.5–5.1)
Sodium: 151 mmol/L — ABNORMAL HIGH (ref 135–145)

## 2014-10-31 LAB — ABO/RH: ABO/RH(D): A POS

## 2014-10-31 LAB — MRSA PCR SCREENING: MRSA by PCR: NEGATIVE

## 2014-10-31 LAB — POCT I-STAT GLUCOSE
Glucose, Bld: 132 mg/dL — ABNORMAL HIGH (ref 65–99)
Glucose, Bld: 127 mg/dL — ABNORMAL HIGH (ref 65–99)
Operator id: 190282
Operator id: 190282

## 2014-10-31 SURGERY — CRANIOTOMY TUMOR EXCISION
Anesthesia: General

## 2014-10-31 MED ORDER — THROMBIN 20000 UNITS EX SOLR
CUTANEOUS | Status: DC | PRN
  Administered 2014-10-31: 10:00:00 via TOPICAL

## 2014-10-31 MED ORDER — HEMOSTATIC AGENTS (NO CHARGE) OPTIME
TOPICAL | Status: DC | PRN
  Administered 2014-10-31: 1 via TOPICAL

## 2014-10-31 MED ORDER — DEXAMETHASONE SODIUM PHOSPHATE 4 MG/ML IJ SOLN
INTRAMUSCULAR | Status: DC | PRN
  Administered 2014-10-31: 10 mg via INTRAVENOUS

## 2014-10-31 MED ORDER — FENTANYL CITRATE (PF) 100 MCG/2ML IJ SOLN
INTRAMUSCULAR | Status: DC | PRN
Start: 2014-10-31 — End: 2014-10-31
  Administered 2014-10-31: 25 ug via INTRAVENOUS
  Administered 2014-10-31: 150 ug via INTRAVENOUS

## 2014-10-31 MED ORDER — THROMBIN 5000 UNITS EX SOLR
OROMUCOSAL | Status: DC | PRN
  Administered 2014-10-31: 11:00:00 via TOPICAL

## 2014-10-31 MED ORDER — PHENYLEPHRINE HCL 10 MG/ML IJ SOLN
10.0000 mg | INTRAVENOUS | Status: DC | PRN
  Administered 2014-10-31: 25 ug/min via INTRAVENOUS

## 2014-10-31 MED ORDER — HYDROCODONE-ACETAMINOPHEN 5-325 MG PO TABS
1.0000 | ORAL_TABLET | ORAL | Status: DC | PRN
  Administered 2014-10-31 – 2014-11-04 (×7): 2 via ORAL
  Filled 2014-10-31 (×7): qty 2

## 2014-10-31 MED ORDER — LACTATED RINGERS IV SOLN
INTRAVENOUS | Status: DC | PRN
  Administered 2014-10-31: 08:00:00 via INTRAVENOUS

## 2014-10-31 MED ORDER — FLUTICASONE PROPIONATE 50 MCG/ACT NA SUSP: 2.0000 | Freq: Every day | NASAL | Status: DC | PRN

## 2014-10-31 MED ORDER — MAGNESIUM HYDROXIDE 400 MG/5ML PO SUSP: 30.0000 mL | Freq: Every day | ORAL | Status: DC | PRN

## 2014-10-31 MED ORDER — LIDOCAINE HCL (CARDIAC) 20 MG/ML IV SOLN
INTRAVENOUS | Status: DC | PRN
  Administered 2014-10-31: 100 mg via INTRAVENOUS

## 2014-10-31 MED ORDER — PROPOFOL 10 MG/ML IV BOLUS
INTRAVENOUS | Status: AC
  Filled 2014-10-31: qty 20

## 2014-10-31 MED ORDER — ONDANSETRON HCL 4 MG PO TABS: 4.0000 mg | ORAL_TABLET | Freq: Four times a day (QID) | ORAL | Status: DC | PRN

## 2014-10-31 MED ORDER — BACITRACIN ZINC 500 UNIT/GM EX OINT
TOPICAL_OINTMENT | CUTANEOUS | Status: DC | PRN
  Administered 2014-10-31: 1 via TOPICAL

## 2014-10-31 MED ORDER — ROCURONIUM BROMIDE 50 MG/5ML IV SOLN
INTRAVENOUS | Status: AC
  Filled 2014-10-31: qty 1

## 2014-10-31 MED ORDER — SODIUM CHLORIDE 0.9 % IR SOLN
Status: DC | PRN
  Administered 2014-10-31: 10:00:00

## 2014-10-31 MED ORDER — PHENYLEPHRINE 40 MCG/ML (10ML) SYRINGE FOR IV PUSH (FOR BLOOD PRESSURE SUPPORT)
PREFILLED_SYRINGE | INTRAVENOUS | Status: AC
  Filled 2014-10-31: qty 10

## 2014-10-31 MED ORDER — NEOSTIGMINE METHYLSULFATE 10 MG/10ML IV SOLN
INTRAVENOUS | Status: DC | PRN
Start: 2014-10-31 — End: 2014-10-31
  Administered 2014-10-31: 5 mg via INTRAVENOUS

## 2014-10-31 MED ORDER — MANNITOL 20 % IV SOLN
INTRAVENOUS | Status: DC | PRN
  Administered 2014-10-31: 10:00:00 via INTRAVENOUS

## 2014-10-31 MED ORDER — LIDOCAINE HCL 4 % MT SOLN
OROMUCOSAL | Status: DC | PRN
  Administered 2014-10-31: 4 mL via TOPICAL

## 2014-10-31 MED ORDER — ARTIFICIAL TEARS OP OINT
TOPICAL_OINTMENT | OPHTHALMIC | Status: AC
  Filled 2014-10-31: qty 3.5

## 2014-10-31 MED ORDER — SODIUM CHLORIDE 0.9 % IV SOLN
0.0125 ug/kg/min | INTRAVENOUS | Status: AC
  Administered 2014-10-31: .1 ug/kg/min via INTRAVENOUS
  Administered 2014-10-31: 0.15 ug/kg/min via INTRAVENOUS
  Filled 2014-10-31: qty 2000

## 2014-10-31 MED ORDER — 0.9 % SODIUM CHLORIDE (POUR BTL) OPTIME
TOPICAL | Status: DC | PRN
  Administered 2014-10-31 (×3): 1000 mL

## 2014-10-31 MED ORDER — FENTANYL CITRATE (PF) 250 MCG/5ML IJ SOLN
INTRAMUSCULAR | Status: AC
  Filled 2014-10-31: qty 5

## 2014-10-31 MED ORDER — DEXAMETHASONE SODIUM PHOSPHATE 4 MG/ML IJ SOLN
4.0000 mg | Freq: Three times a day (TID) | INTRAMUSCULAR | Status: DC
  Administered 2014-11-02 (×2): 4 mg via INTRAVENOUS
  Filled 2014-10-31 (×6): qty 1

## 2014-10-31 MED ORDER — GLYCOPYRROLATE 0.2 MG/ML IJ SOLN
INTRAMUSCULAR | Status: AC
  Filled 2014-10-31: qty 3

## 2014-10-31 MED ORDER — BISACODYL 10 MG RE SUPP: 10.0000 mg | Freq: Every day | RECTAL | Status: DC | PRN

## 2014-10-31 MED ORDER — LIDOCAINE-EPINEPHRINE 1 %-1:100000 IJ SOLN
INTRAMUSCULAR | Status: DC | PRN
  Administered 2014-10-31: 15 mL

## 2014-10-31 MED ORDER — ALPRAZOLAM 0.5 MG PO TABS: 0.5000 mg | ORAL_TABLET | Freq: Every evening | ORAL | Status: DC | PRN

## 2014-10-31 MED ORDER — HYDROXYZINE HCL 25 MG PO TABS
50.0000 mg | ORAL_TABLET | ORAL | Status: DC | PRN
  Filled 2014-10-31: qty 1

## 2014-10-31 MED ORDER — HYDROXYZINE HCL 50 MG/ML IM SOLN
50.0000 mg | INTRAMUSCULAR | Status: DC | PRN
  Filled 2014-10-31: qty 1

## 2014-10-31 MED ORDER — MORPHINE SULFATE 2 MG/ML IJ SOLN
1.0000 mg | INTRAMUSCULAR | Status: DC | PRN
  Administered 2014-10-31 – 2014-11-01 (×7): 2 mg via INTRAVENOUS
  Filled 2014-10-31 (×6): qty 1

## 2014-10-31 MED ORDER — POTASSIUM CHLORIDE IN NACL 20-0.9 MEQ/L-% IV SOLN
INTRAVENOUS | Status: DC
  Administered 2014-10-31: 15:00:00 via INTRAVENOUS
  Filled 2014-10-31 (×2): qty 1000

## 2014-10-31 MED ORDER — ESMOLOL HCL 10 MG/ML IV SOLN
INTRAVENOUS | Status: DC | PRN
  Administered 2014-10-31 (×2): 30 mg via INTRAVENOUS
  Administered 2014-10-31: 10 mg via INTRAVENOUS

## 2014-10-31 MED ORDER — METHYLENE BLUE 1 % INJ SOLN
INTRAMUSCULAR | Status: DC | PRN
  Administered 2014-10-31: 10 mL

## 2014-10-31 MED ORDER — SUCCINYLCHOLINE CHLORIDE 20 MG/ML IJ SOLN
INTRAMUSCULAR | Status: AC
  Filled 2014-10-31: qty 1

## 2014-10-31 MED ORDER — HYDRALAZINE HCL 20 MG/ML IJ SOLN: 5.0000 mg | INTRAMUSCULAR | Status: DC | PRN

## 2014-10-31 MED ORDER — BUPIVACAINE HCL (PF) 0.5 % IJ SOLN
INTRAMUSCULAR | Status: DC | PRN
  Administered 2014-10-31: 15 mL

## 2014-10-31 MED ORDER — SODIUM CHLORIDE 0.9 % IV SOLN
1000.0000 mg | INTRAVENOUS | Status: AC
  Administered 2014-10-31: 1000 mg via INTRAVENOUS
  Filled 2014-10-31: qty 10

## 2014-10-31 MED ORDER — GLYCOPYRROLATE 0.2 MG/ML IJ SOLN
INTRAMUSCULAR | Status: AC
  Filled 2014-10-31: qty 1

## 2014-10-31 MED ORDER — MIDAZOLAM HCL 2 MG/2ML IJ SOLN
INTRAMUSCULAR | Status: AC
  Filled 2014-10-31: qty 2

## 2014-10-31 MED ORDER — GLYCOPYRROLATE 0.2 MG/ML IJ SOLN
INTRAMUSCULAR | Status: DC | PRN
  Administered 2014-10-31: .8 mg via INTRAVENOUS

## 2014-10-31 MED ORDER — NEOSTIGMINE METHYLSULFATE 10 MG/10ML IV SOLN
INTRAVENOUS | Status: AC
  Filled 2014-10-31: qty 1

## 2014-10-31 MED ORDER — DESMOPRESSIN ACETATE 4 MCG/ML IJ SOLN
1.0000 ug | INTRAMUSCULAR | Status: AC
  Administered 2014-10-31: 1 ug via INTRAVENOUS
  Filled 2014-10-31: qty 0.25

## 2014-10-31 MED ORDER — DEXAMETHASONE SODIUM PHOSPHATE 10 MG/ML IJ SOLN
6.0000 mg | Freq: Four times a day (QID) | INTRAMUSCULAR | Status: AC
  Administered 2014-10-31 – 2014-11-01 (×4): 6 mg via INTRAVENOUS
  Filled 2014-10-31 (×4): qty 1

## 2014-10-31 MED ORDER — LIDOCAINE HCL (CARDIAC) 20 MG/ML IV SOLN
INTRAVENOUS | Status: AC
  Filled 2014-10-31: qty 5

## 2014-10-31 MED ORDER — MORPHINE SULFATE 2 MG/ML IJ SOLN
INTRAMUSCULAR | Status: AC
  Filled 2014-10-31: qty 1

## 2014-10-31 MED ORDER — ROCURONIUM BROMIDE 100 MG/10ML IV SOLN
INTRAVENOUS | Status: DC | PRN
  Administered 2014-10-31: 50 mg via INTRAVENOUS

## 2014-10-31 MED ORDER — LABETALOL HCL 5 MG/ML IV SOLN
INTRAVENOUS | Status: DC | PRN
  Administered 2014-10-31: 5 mg via INTRAVENOUS
  Administered 2014-10-31: 10 mg via INTRAVENOUS
  Administered 2014-10-31: 5 mg via INTRAVENOUS

## 2014-10-31 MED ORDER — LEVETIRACETAM 500 MG/5ML IV SOLN
500.0000 mg | Freq: Two times a day (BID) | INTRAVENOUS | Status: DC
  Administered 2014-10-31 – 2014-11-01 (×2): 500 mg via INTRAVENOUS
  Filled 2014-10-31 (×3): qty 5

## 2014-10-31 MED ORDER — LABETALOL HCL 5 MG/ML IV SOLN: 5.0000 mg | INTRAVENOUS | Status: DC | PRN

## 2014-10-31 MED ORDER — ARTIFICIAL TEARS OP OINT
TOPICAL_OINTMENT | OPHTHALMIC | Status: DC | PRN
  Administered 2014-10-31: 1 via OPHTHALMIC

## 2014-10-31 MED ORDER — VECURONIUM BROMIDE 10 MG IV SOLR
INTRAVENOUS | Status: DC | PRN
  Administered 2014-10-31 (×2): 2 mg via INTRAVENOUS

## 2014-10-31 MED ORDER — DEXAMETHASONE SODIUM PHOSPHATE 4 MG/ML IJ SOLN
4.0000 mg | Freq: Four times a day (QID) | INTRAMUSCULAR | Status: AC
Start: 2014-11-01 — End: 2014-11-02
  Administered 2014-11-01 – 2014-11-02 (×4): 4 mg via INTRAVENOUS
  Filled 2014-10-31 (×5): qty 1

## 2014-10-31 MED ORDER — ACETAMINOPHEN 10 MG/ML IV SOLN
INTRAVENOUS | Status: AC
  Administered 2014-10-31: 1000 mg via INTRAVENOUS
  Filled 2014-10-31: qty 100

## 2014-10-31 MED ORDER — DEXTROSE 5 % IV SOLN
1.0000 g | INTRAVENOUS | Status: AC
  Administered 2014-10-31: 1 g via INTRAVENOUS
  Filled 2014-10-31: qty 10

## 2014-10-31 MED ORDER — ONDANSETRON HCL 4 MG/2ML IJ SOLN
INTRAMUSCULAR | Status: AC
  Filled 2014-10-31: qty 2

## 2014-10-31 MED ORDER — EPHEDRINE SULFATE 50 MG/ML IJ SOLN
INTRAMUSCULAR | Status: AC
  Filled 2014-10-31: qty 1

## 2014-10-31 MED ORDER — ONDANSETRON HCL 4 MG/2ML IJ SOLN
INTRAMUSCULAR | Status: DC | PRN
  Administered 2014-10-31: 4 mg via INTRAVENOUS

## 2014-10-31 MED ORDER — POTASSIUM CHLORIDE IN NACL 20-0.45 MEQ/L-% IV SOLN
INTRAVENOUS | Status: DC
  Administered 2014-10-31 – 2014-11-01 (×2): via INTRAVENOUS
  Filled 2014-10-31 (×8): qty 1000

## 2014-10-31 MED ORDER — PANTOPRAZOLE SODIUM 40 MG IV SOLR
40.0000 mg | Freq: Every day | INTRAVENOUS | Status: DC
  Administered 2014-10-31 – 2014-11-01 (×2): 40 mg via INTRAVENOUS
  Filled 2014-10-31 (×4): qty 40

## 2014-10-31 MED ORDER — ALBUTEROL SULFATE HFA 108 (90 BASE) MCG/ACT IN AERS
INHALATION_SPRAY | RESPIRATORY_TRACT | Status: DC | PRN
  Administered 2014-10-31: 2 via RESPIRATORY_TRACT
  Administered 2014-10-31: 4 via RESPIRATORY_TRACT

## 2014-10-31 MED ORDER — SODIUM CHLORIDE 0.9 % IJ SOLN
INTRAMUSCULAR | Status: AC
  Filled 2014-10-31: qty 10

## 2014-10-31 MED ORDER — PROPOFOL 10 MG/ML IV BOLUS
INTRAVENOUS | Status: DC | PRN
  Administered 2014-10-31: 20 mg via INTRAVENOUS
  Administered 2014-10-31: 30 mg via INTRAVENOUS
  Administered 2014-10-31: 200 mg via INTRAVENOUS

## 2014-10-31 MED ORDER — FENTANYL CITRATE (PF) 100 MCG/2ML IJ SOLN: 25.0000 ug | INTRAMUSCULAR | Status: DC | PRN

## 2014-10-31 MED ORDER — SODIUM CHLORIDE 0.9 % IV SOLN
0.0125 ug/kg/min | INTRAVENOUS | Status: DC
  Filled 2014-10-31: qty 2000

## 2014-10-31 MED ORDER — ONDANSETRON HCL 4 MG/2ML IJ SOLN: 4.0000 mg | Freq: Four times a day (QID) | INTRAMUSCULAR | Status: DC | PRN

## 2014-10-31 MED ORDER — ONDANSETRON HCL 4 MG/2ML IJ SOLN: 4.0000 mg | Freq: Once | INTRAMUSCULAR | Status: DC | PRN

## 2014-10-31 SURGICAL SUPPLY — 86 items
APPLICATOR COTTON TIP 6IN STRL (MISCELLANEOUS) ×2 IMPLANT
BAG DECANTER FOR FLEXI CONT (MISCELLANEOUS) ×2 IMPLANT
BALL CTTN LRG ABS STRL LF (GAUZE/BANDAGES/DRESSINGS)
BANDAGE GAUZE 4  KLING STR (GAUZE/BANDAGES/DRESSINGS) ×2 IMPLANT
BIT DRILL NEURO 2X3.1 SFT TUCH (MISCELLANEOUS) IMPLANT
BIT DRILL WIRE PASS 1.3MM (BIT) IMPLANT
BLADE CLIPPER SURG (BLADE) ×3 IMPLANT
BLADE ULTRA TIP 2M (BLADE) IMPLANT
BNDG GAUZE ELAST 4 BULKY (GAUZE/BANDAGES/DRESSINGS) ×1 IMPLANT
BRUSH SCRUB EZ PLAIN DRY (MISCELLANEOUS) ×2 IMPLANT
BUR ACORN 6.0 PRECISION (BURR) ×2 IMPLANT
BUR MATCHSTICK NEURO 3.0 LAGG (BURR) IMPLANT
BUR SPIRAL ROUTER 2.3 (BUR) ×1 IMPLANT
CANISTER SUCT 3000ML PPV (MISCELLANEOUS) ×2 IMPLANT
CLIP TI MEDIUM 6 (CLIP) IMPLANT
CONT SPEC 4OZ CLIKSEAL STRL BL (MISCELLANEOUS) ×6 IMPLANT
COTTONBALL LRG STERILE PKG (GAUZE/BANDAGES/DRESSINGS) IMPLANT
COVER BACK TABLE 60X90IN (DRAPES) ×1 IMPLANT
DECANTER SPIKE VIAL GLASS SM (MISCELLANEOUS) ×2 IMPLANT
DRAIN SNY WOU 7FLT (WOUND CARE) IMPLANT
DRAIN SUBARACHNOID (WOUND CARE) IMPLANT
DRAPE MICROSCOPE LEICA (MISCELLANEOUS) ×1 IMPLANT
DRAPE NEUROLOGICAL W/INCISE (DRAPES) ×2 IMPLANT
DRAPE STERI IOBAN 125X83 (DRAPES) IMPLANT
DRAPE SURG 17X23 STRL (DRAPES) IMPLANT
DRAPE WARM FLUID 44X44 (DRAPE) ×2 IMPLANT
DRILL NEURO 2X3.1 SOFT TOUCH (MISCELLANEOUS) ×2
DRILL WIRE PASS 1.3MM (BIT)
DRSG ADAPTIC 3X8 NADH LF (GAUZE/BANDAGES/DRESSINGS) ×2 IMPLANT
ELECT CAUTERY BLADE 6.4 (BLADE) ×1 IMPLANT
ELECT REM PT RETURN 9FT ADLT (ELECTROSURGICAL) ×2
ELECTRODE REM PT RTRN 9FT ADLT (ELECTROSURGICAL) ×1 IMPLANT
EVACUATOR 1/8 PVC DRAIN (DRAIN) IMPLANT
EVACUATOR SILICONE 100CC (DRAIN) IMPLANT
FORCEPS BIPOLAR SPETZLER 8 1.0 (NEUROSURGERY SUPPLIES) ×1 IMPLANT
GAUZE SPONGE 4X4 12PLY STRL (GAUZE/BANDAGES/DRESSINGS) ×2 IMPLANT
GAUZE SPONGE 4X4 16PLY XRAY LF (GAUZE/BANDAGES/DRESSINGS) ×1 IMPLANT
GLOVE BIOGEL PI IND STRL 8 (GLOVE) ×1 IMPLANT
GLOVE BIOGEL PI INDICATOR 8 (GLOVE) ×3
GLOVE ECLIPSE 7.5 STRL STRAW (GLOVE) ×6 IMPLANT
GLOVE EXAM NITRILE LRG STRL (GLOVE) IMPLANT
GLOVE EXAM NITRILE MD LF STRL (GLOVE) IMPLANT
GLOVE EXAM NITRILE XL STR (GLOVE) IMPLANT
GLOVE EXAM NITRILE XS STR PU (GLOVE) IMPLANT
GLOVE SURG SS PI 7.0 STRL IVOR (GLOVE) ×3 IMPLANT
GOWN STRL REUS W/ TWL LRG LVL3 (GOWN DISPOSABLE) IMPLANT
GOWN STRL REUS W/ TWL XL LVL3 (GOWN DISPOSABLE) IMPLANT
GOWN STRL REUS W/TWL 2XL LVL3 (GOWN DISPOSABLE) ×1 IMPLANT
GOWN STRL REUS W/TWL LRG LVL3 (GOWN DISPOSABLE) ×4
GOWN STRL REUS W/TWL XL LVL3 (GOWN DISPOSABLE) ×2
GRAFT PERICARIUM DURAL BV 4X5 (Graft) ×2 IMPLANT
HEMOSTAT POWDER SURGIFOAM 1G (HEMOSTASIS) ×1 IMPLANT
HEMOSTAT SURGICEL 2X14 (HEMOSTASIS) ×3 IMPLANT
KIT BASIN OR (CUSTOM PROCEDURE TRAY) ×2 IMPLANT
KIT ROOM TURNOVER OR (KITS) ×2 IMPLANT
KNIFE ARACHNOID DISP AM-24-S (MISCELLANEOUS) ×1 IMPLANT
NDL SPNL 22GX3.5 QUINCKE BK (NEEDLE) ×1 IMPLANT
NEEDLE SPNL 22GX3.5 QUINCKE BK (NEEDLE) ×2 IMPLANT
NS IRRIG 1000ML POUR BTL (IV SOLUTION) ×5 IMPLANT
PACK CRANIOTOMY (CUSTOM PROCEDURE TRAY) ×2 IMPLANT
PAD ARMBOARD 7.5X6 YLW CONV (MISCELLANEOUS) ×3 IMPLANT
PAD EYE OVAL STERILE LF (GAUZE/BANDAGES/DRESSINGS) IMPLANT
PATTIES SURGICAL .25X.25 (GAUZE/BANDAGES/DRESSINGS) IMPLANT
PATTIES SURGICAL .5 X.5 (GAUZE/BANDAGES/DRESSINGS) ×1 IMPLANT
PATTIES SURGICAL .5 X1 (DISPOSABLE) ×1 IMPLANT
PATTIES SURGICAL 1/4 X 3 (GAUZE/BANDAGES/DRESSINGS) ×1 IMPLANT
PATTIES SURGICAL 3 X3 (GAUZE/BANDAGES/DRESSINGS)
PATTIES SURGICAL 3X3 (GAUZE/BANDAGES/DRESSINGS) IMPLANT
PLATE 1.5  2HOLE MED NEURO (Plate) ×1 IMPLANT
PLATE 1.5 2HOLE MED NEURO (Plate) IMPLANT
PLATE 1.5 5HOLE SQUARE (Plate) ×2 IMPLANT
RUBBERBAND STERILE (MISCELLANEOUS) ×2 IMPLANT
SCREW SELF DRILL HT 1.5/4MM (Screw) ×10 IMPLANT
SPONGE SURGIFOAM ABS GEL 100 (HEMOSTASIS) ×2 IMPLANT
STAPLER SKIN PROX WIDE 3.9 (STAPLE) ×3 IMPLANT
SUT NURALON 4 0 TR CR/8 (SUTURE) ×5 IMPLANT
SUT VIC AB 0 CT1 18XCR BRD8 (SUTURE) ×2 IMPLANT
SUT VIC AB 0 CT1 8-18 (SUTURE) ×4
SUT VIC AB 2-0 CP2 18 (SUTURE) ×4 IMPLANT
SYR CONTROL 10ML LL (SYRINGE) ×1 IMPLANT
TOWEL OR 17X24 6PK STRL BLUE (TOWEL DISPOSABLE) ×2 IMPLANT
TOWEL OR 17X26 10 PK STRL BLUE (TOWEL DISPOSABLE) ×2 IMPLANT
TRAY FOLEY CATH 14FR (SET/KITS/TRAYS/PACK) ×1 IMPLANT
TRAY FOLEY W/METER SILVER 16FR (SET/KITS/TRAYS/PACK) IMPLANT
UNDERPAD 30X30 INCONTINENT (UNDERPADS AND DIAPERS) ×2 IMPLANT
WATER STERILE IRR 1000ML POUR (IV SOLUTION) ×3 IMPLANT

## 2014-10-31 NOTE — Op Note (Signed)
10/31/2014  11:55 AM  PATIENT:  Amanda Murphy  34 y.o. female  PRE-OPERATIVE DIAGNOSIS:  Right suprasellar cystic tumor, hemianopsia  POST-OPERATIVE DIAGNOSIS:  Right suprasellar cystic tumor, hemianopsia  PROCEDURE:  Procedure(s):  Right pterional craniotomy and gross total resection of right suprasellar cystic tumor, with microdissection, microsurgical technique, and the operating microscope  SURGEON:  Surgeon(s): Jovita Gamma, MD Ashok Pall, MD  ASSISTANTS: Ashok Pall, M.D.  ANESTHESIA:   general  BLOOD ADMINISTERED:none  COUNT: Correct per nursing staff  SPECIMEN:  Source of Specimen:  brain tumor  DICTATION: Patient was brought to the operating room, placed under general endotracheal anesthesia. 3 pin [place]field head holder was applied, the patient was turned to the left and blanket rolls were placed beneath the right shoulder. The scalp was prepped with Betadine soap and solution draped in a sterile fashion. The line of the incision was infiltrated with local site with epinephrine incision was made in a curvilinear right pterional fashion. Raney clips were applied for scalp edge is to maintain hemostasis. The scalp flap was reflected towards the pterion. The temporalis fascia was incised and the temporalis muscle and fascia were mobilized and retracted posterior inferiorly. 2 bur holes were made with the high-speed drill, 1 in the keyhole location and the other in the lower temporal fossa. The dura dissected from the overlying skull and then the bone flap was turned with the high-speed drill craniotome attachment. The bone flap was elevated. The dura was adherent to much of the bone flap was mobilized. Then we proceeded to remove the sphenoid wing to improve our intracranial exposure. The skull was dissected from the dura and the sphenoid wing removed. The dura was then further opened in a curvilinear fashion hinge towards the pterion. The operative microscope was draped and  brought in the field to provide some medication, limitation, and visualization, the remainder the dissection and tumor resection was performed using micro-section microsurgical technique. The right sylvian fissure was opened, and CSF aspirated. We able to progressively open the arachnoid bands and exposed down to the internal carotid artery and right optic nerve. The arachnoid cisterns were further opened with sharp dissection, we were able to more fully mobilize the right frontal lobe which was retracted with self retaining retractor. The tumor was visualized with a thin cystic walled component lateral to the internal carotid artery and a somewhat thicker walled cystic component located underneath the internal carotid artery within the optic carotid fissure and beneath the right optic nerve and chiasm. We open the lateral most portion of the cyst and aspirated this cyst contents. This allowed [place] to begin to mobilize the walls of the cyst from the surrounding structures. Adhesions between the tumor and the surrounding structures were divided as necessary, and the wall of the cyst was mobilized laterally and removed from lateral to the internal carotid artery. We could visualize the basilar artery posteriorly. The intradural cavity was irrigated gently was warm saline and reexamined the resection cavity is no significant remaining cyst wall was present. It is felt that good decompression of the optic nerve and chiasm had been achieved. Hemostasis was established with the use of Gelfoam with thrombin Surgifoam and bipolar cautery as necessary. Good hemostasis was established. We proceeded with the closure. Two pieces of Integra bovine pericardium were I treated in saline, and then placed over the dural defect. The bone flap was secured to the skull with 2 square and 1 medium straight Lorenz titanium cranial plates with 4 mm self  drilling screws. The temporalis fascia was approximated with interrupted undyed 0 Vicryl  sutures. The galea was closed with interrupted inverted 0 and 2-0 undyed Vicryl sutures.  Scalp edges were approximated with surgical staples. The wound chest with Adaptic, sterile gauze, and wrapped with 2 Kling and a Kerlix. Following surgery the patient was taken out of the cubital limited head holder, to be reversed and the aesthetic, extubated, and transferred to the recovery room for further care. EBL 150 mL.  PLAN OF CARE: Admit to inpatient   PATIENT DISPOSITION:  PACU - hemodynamically stable.   Delay start of Pharmacological VTE agent (>24hrs) due to surgical blood loss or risk of bleeding:  yes

## 2014-10-31 NOTE — H&P (Signed)
Subjective: Patient is a 34 y.o. right-handed black female who is admitted for treatment of right-sided suprasellar mass with compression of the optic structures.  Patient presented a year ago with visual alteration. She was found to have a right eye temporal hemianopsia. Patient referred from her optometrist to [facility]. MRI was done and revealed the tumor. Patient was then referred for neurosurgical evaluation. Formal MRI of the sella without and with gadolinium as well as pituitary laboratories were performed. 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 Pituitary endocrinologic profile is unremarkable. Patient is admitted now for a right pterional craniotomy for resection of tumor.   Patient Active Problem List   Diagnosis Date Noted  . Situational anxiety 10/19/2014  . Travel advice encounter 09/21/2014  . Allergic rhinitis 08/16/2014  . 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 Hemifacial spasm 05/15/2014  . Vision, loss, sudden 01/18/2014  . Morbid obesity 01/18/2014   Past Medical History  Diagnosis Date  . Vision abnormalities   . Headache(784.0)   . PCOS (polycystic ovarian syndrome)   . Fibroids   . Astigmatism   . Asthma     as child   . Pneumonia     3-4 yrs ago     Past Surgical History  Procedure Laterality Date  . Wisdom tooth extraction  2006  . Myomectomy      Prescriptions prior to admission  Medication Sig Dispense Refill Last Dose  . acetaminophen (TYLENOL) 500 MG tablet Take 1,000 mg by mouth every 6 (six) hours as needed for mild pain.   10/28/2014  . ciprofloxacin (CIPRO) 500 MG tablet Take 500 mg by mouth as needed (for "traveller's diarrhea" Pt reports taking 3 doses during trip to [place].).   10/26/2014  . Cyanocobalamin (VITAMIN B 12 PO) Take 1 tablet by mouth daily.   10/26/2014  . etonogestrel-ethinyl estradiol (NUVARING) 0.12-0.015 MG/24HR vaginal ring Place 1 each vaginally every 28 (twenty-eight) days. Insert vaginally  and leave in place for 3 consecutive weeks, then remove for 1 week.   Past Month at Unknown time  . Multiple Vitamin (MULTIVITAMIN WITH MINERALS) TABS tablet Take 1 tablet by mouth daily.   Past Month at Unknown time  . naproxen sodium (ANAPROX) 220 MG tablet Take 440 mg by mouth daily as needed (pain, headache).    Past Month at Unknown time  . ALPRAZolam (XANAX) 0.5 MG tablet Take 1 tablet (0.5 mg total) by mouth at bedtime as needed for anxiety. 20 tablet 0 10/28/2014  . docusate sodium (COLACE) 100 MG capsule Take 100 mg by mouth 2 (two) times daily as needed for mild constipation.   More than a month at Unknown time  . fluticasone (FLONASE) 50 MCG/ACT nasal spray Place 2 sprays into both nostrils daily. (Patient taking differently: Place 2 sprays into both nostrils daily as needed for allergies or rhinitis. ) 16 g 6 More than a month at Unknown time  . levocetirizine (XYZAL) 5 MG tablet Take 1 tablet (5 mg total) by mouth every evening. (Patient taking differently: Take 5 mg by mouth daily as needed for allergies. ) 30 tablet 3 More than a month at Unknown time   No Known Allergies  History  Substance Use Topics  . Smoking status: Never Smoker   . Smokeless tobacco: Never Used  . Alcohol Use: 0.0 oz/week    0 Standard drinks or equivalent per week     Comment: occasional    Family History  Problem Relation Age of Onset  .  Hypertension Mother   . Diabetes Mother   . Ovarian cancer    . Breast cancer    . Fibroids       Review of Systems A comprehensive review of systems was negative.  Objective: Vital signs in last 24 hours: Temp:  [97.7 F (36.5 C)] 97.7 F (36.5 C) (05/25 0637) Pulse Rate:  [76] 76 (05/25 0637) Resp:  [18] 18 (05/25 0637) BP: (131)/(68) 131/68 mmHg (05/25 0637) SpO2:  [100 %] 100 % (05/25 0637) Weight:  [110.678 kg (244 lb)] 110.678 kg (244 lb) (05/25 9675)  EXAM: Patient is an obese black female in no acute distress. Lungs are clear to auscultation , the  patient has symmetrical respiratory excursion. Heart has a regular rate and rhythm normal S1 and S2 no murmur.   Abdomen is soft nontender nondistended bowel sounds are present. Extremity examination shows no clubbing cyanosis or edema. Mental status examination shows patient is awake, alert, fully oriented. Speech is fluent. She has good comprehension. Cranial nerve examination shows on funduscopic examination no evidence of papilledema, hemorrhage, or exudate. Pupils are equal, round, and reactive to light, and about 4 mm bilaterally. EOMI. Facial sensation intact. Facial movements symmetrical. Hearing is present bilaterally. Palatal movement is symmetrical. Shoulder shrug is symmetrical. Tongue is midline. Motor examination shows 5 over 5 strength in the upper and lower extremities including the deltoid biceps triceps and intrinsics grip iliopsoas quadriceps dorsiflexor EHL and plantar flexor. Sensation is intact to pinprick throughout the digits of the upper extremities as well as through the distal lower extremities. Reflexes are symmetrical and without evidence of pathologic reflexes. Patient has a normal gait and stance.   Data Review:CBC    Component Value Date/Time   WBC 6.5 10/19/2014 0918   RBC 4.34 10/19/2014 0918   HGB 12.1 10/19/2014 0918   HCT 36.5 10/19/2014 0918   PLT 388 10/19/2014 0918   MCV 84.1 10/19/2014 0918   MCH 27.9 10/19/2014 0918   MCHC 33.2 10/19/2014 0918   RDW 16.5* 10/19/2014 0918                          BMET    Component Value Date/Time   NA 136 10/19/2014 0918   NA 136 01/18/2014 1145   K 3.6 10/19/2014 0918   CL 103 10/19/2014 0918   CO2 21* 10/19/2014 0918   GLUCOSE 114* 10/19/2014 0918   GLUCOSE 81 01/18/2014 1145   BUN 10 10/19/2014 0918   BUN 11 01/18/2014 1145   CREATININE 0.97 10/19/2014 0918   CALCIUM 9.0 10/19/2014 0918   GFRNONAA >60 10/19/2014 0918   GFRAA >60 10/19/2014 9163     Assessment/Plan: Patient admitted for craniotomy for  resection of a suprasellar cystic mass. We've discussed the nature of the condition, reviewed her radiologic studies, as discussed tip of the surgery, hospital stay, and overall recuperation. We discussed risks of surgery including risks of infection, bleeding, possibly for transfusion, the risk of neurologic dysfunction including loss of vision, pituitary dysfunction, hypothalamic dysfunction, coma, stroke, paralysis, and death. We discussed the risks of recurrence the tumor, particularly with subtotal resection, and an aesthetic risks of myocardial infarction, stroke, pneumonia, and death. Understanding all this patient wishes to proceed with surgery and is admitted for such.   Hosie Spangle, MD 10/31/2014 8:31 AM

## 2014-10-31 NOTE — Anesthesia Procedure Notes (Signed)
Procedure Name: Intubation Performed by: Suzy Bouchard Pre-anesthesia Checklist: Patient identified, Timeout performed, Emergency Drugs available, Suction available and Patient being monitored Patient Re-evaluated:Patient Re-evaluated prior to inductionOxygen Delivery Method: Circle system utilized Preoxygenation: Pre-oxygenation with 100% oxygen Intubation Type: IV induction Ventilation: Mask ventilation without difficulty and Oral airway inserted - appropriate to patient size Laryngoscope Size: Mac and 3 Grade View: Grade I Tube type: Oral Tube size: 7.0 mm Number of attempts: 1 Airway Equipment and Method: Stylet,  Bite block and LTA kit utilized Placement Confirmation: ETT inserted through vocal cords under direct vision,  positive ETCO2,  CO2 detector and breath sounds checked- equal and bilateral Secured at: 22 cm Tube secured with: Tape Dental Injury: Teeth and Oropharynx as per pre-operative assessment

## 2014-10-31 NOTE — Addendum Note (Signed)
Addendum  created 10/31/14 1630 by Moshe Salisbury, CRNA   Modules edited: Anesthesia Events, Narrator   Narrator:  Narrator: Event Log Edited

## 2014-10-31 NOTE — Anesthesia Postprocedure Evaluation (Signed)
  Anesthesia Post-op Note  Patient: Amanda Murphy  Procedure(s) Performed: Procedure(s) (LRB): CRANIOTOMY FOR RESECTION OF TUMOR (N/A)  Patient Location: PACU  Anesthesia Type: General  Level of Consciousness: awake and alert   Airway and Oxygen Therapy: Patient Spontanous Breathing  Post-op Pain: mild  Post-op Assessment: Post-op Vital signs reviewed, Patient's Cardiovascular Status Stable, Respiratory Function Stable, Patent Airway and No signs of Nausea or vomiting  Last Vitals:  Filed Vitals:   10/31/14 1300  BP:   Pulse: 88  Temp:   Resp: 25    Post-op Vital Signs: stable   Complications: No apparent anesthesia complications

## 2014-10-31 NOTE — Addendum Note (Signed)
Addendum  created 10/31/14 1426 by Suzy Bouchard, CRNA   Modules edited: Anesthesia Medication Administration

## 2014-10-31 NOTE — Addendum Note (Signed)
Addendum  created 10/31/14 1337 by Suzy Bouchard, CRNA   Modules edited: Anesthesia Attestations

## 2014-10-31 NOTE — Transfer of Care (Signed)
Immediate Anesthesia Transfer of Care Note  Patient: Amanda Murphy  Procedure(s) Performed: Procedure(s) with comments: CRANIOTOMY FOR RESECTION OF TUMOR (N/A) - Craniotomy for resection of tumor  Patient Location: PACU  Anesthesia Type:General  Level of Consciousness: awake and oriented  Airway & Oxygen Therapy: Patient Spontanous Breathing and Patient connected to face mask oxygen  Post-op Assessment: Report given to RN and Post -op Vital signs reviewed and stable  Post vital signs: Reviewed and stable  Last Vitals:  Filed Vitals:   10/31/14 1209  BP:   Pulse:   Temp: 36.9 C  Resp:     Complications: No apparent anesthesia complications

## 2014-10-31 NOTE — Progress Notes (Signed)
Subjective: Patient complaining of headache.  Receiving morphine IV.  Resting in bed. Denies nausea.  Objective:  Vital signs in last 24 hours: Filed Vitals:   10/31/14 1600 10/31/14 1607 10/31/14 1700 10/31/14 1800  BP: 124/66  111/70 130/84  Pulse: 88  82 82  Temp:  98.6 F (37 C)    TempSrc:  Oral    Resp: 22  22 22   Height:      Weight:      SpO2: 96%  96% 96%    Intake/Output from previous day:   Intake/Output this shift: Total I/O In: 2415 [I.V.:2215; IV Piggyback:200] Out: 6384 [TXMIW:8032; Blood:500]  Physical Exam:  Awake, but mildly lethargic. Oriented to name, [facility] hospital, and May 2016. Following commands. Speech fluent. Counting fingers well with each eye. Dressing dry and intact. Moving all 4 extremities well.  CBC  Recent Labs  10/31/14 0915 10/31/14 0949  HGB 11.9* 11.9*  HCT 35.0* 35.0*   BMET  Recent Labs  10/31/14 0915 10/31/14 0949 10/31/14 1321 10/31/14 1532  NA 137 138  --   --   K 3.6 3.1*  --   --   GLUCOSE  --   --  132* 127*   ABG    Component Value Date/Time   PHART 7.295* 10/31/2014 0949   PCO2ART 44.6 10/31/2014 0949   PO2ART 79.0* 10/31/2014 0949   HCO3 21.7 10/31/2014 0949   TCO2 23 10/31/2014 0949   ACIDBASEDEF 5.0* 10/31/2014 0949   O2SAT 94.0 10/31/2014 0949    Assessment/Plan: Will allow sips and chips this evening, which will allow for her to try oral analgesia.   Hosie Spangle, MD 10/31/2014, 6:44 PM

## 2014-10-31 NOTE — Addendum Note (Signed)
Addendum  created 10/31/14 1628 by Suzy Bouchard, CRNA   Modules edited: Anesthesia Events, Narrator   Narrator:  Narrator: Event Log Edited

## 2014-11-01 ENCOUNTER — Encounter (HOSPITAL_COMMUNITY): Payer: Self-pay | Admitting: Neurosurgery

## 2014-11-01 LAB — BASIC METABOLIC PANEL
Anion gap: 8 (ref 5–15)
Anion gap: 9 (ref 5–15)
BUN: 6 mg/dL (ref 6–20)
BUN: 6 mg/dL (ref 6–20)
CO2: 21 mmol/L — ABNORMAL LOW (ref 22–32)
CO2: 23 mmol/L (ref 22–32)
Calcium: 8.2 mg/dL — ABNORMAL LOW (ref 8.9–10.3)
Calcium: 8.6 mg/dL — ABNORMAL LOW (ref 8.9–10.3)
Chloride: 113 mmol/L — ABNORMAL HIGH (ref 101–111)
Chloride: 111 mmol/L (ref 101–111)
Creatinine, Ser: 0.84 mg/dL (ref 0.44–1.00)
Creatinine, Ser: 0.87 mg/dL (ref 0.44–1.00)
GFR calc Af Amer: >60 mL/min (ref >60)
GFR calc Af Amer: >60 mL/min (ref >60)
GFR calc non Af Amer: >60 mL/min (ref >60)
GFR calc non Af Amer: >60 mL/min (ref >60)
Glucose, Bld: 139 mg/dL — ABNORMAL HIGH (ref 65–99)
Glucose, Bld: 141 mg/dL — ABNORMAL HIGH (ref 65–99)
Potassium: 3.9 mmol/L (ref 3.5–5.1)
Potassium: 4.2 mmol/L (ref 3.5–5.1)
Sodium: 142 mmol/L (ref 135–145)
Sodium: 143 mmol/L (ref 135–145)

## 2014-11-01 LAB — CBC WITH DIFFERENTIAL/PLATELET
Basophils Absolute: 0 10*3/uL (ref 0.0–0.1)
Basophils Relative: 0 % (ref 0–1)
Eosinophils Absolute: 0 10*3/uL (ref 0.0–0.7)
Eosinophils Relative: 0 % (ref 0–5)
HCT: 36.7 % (ref 36.0–46.0)
Hemoglobin: 12 g/dL (ref 12.0–15.0)
Lymphocytes Relative: 5 % — ABNORMAL LOW (ref 12–46)
Lymphs Abs: 1.1 10*3/uL (ref 0.7–4.0)
MCH: 27.8 pg (ref 26.0–34.0)
MCHC: 32.7 g/dL (ref 30.0–36.0)
MCV: 85.2 fL (ref 78.0–100.0)
Monocytes Absolute: 1.2 10*3/uL — ABNORMAL HIGH (ref 0.1–1.0)
Monocytes Relative: 6 % (ref 3–12)
Neutro Abs: 19.3 10*3/uL — ABNORMAL HIGH (ref 1.7–7.7)
Neutrophils Relative %: 89 % — ABNORMAL HIGH (ref 43–77)
Platelets: 348 10*3/uL (ref 150–400)
RBC: 4.31 MIL/uL (ref 3.87–5.11)
RDW: 17 % — ABNORMAL HIGH (ref 11.5–15.5)
WBC: 21.7 10*3/uL — ABNORMAL HIGH (ref 4.0–10.5)

## 2014-11-01 MED ORDER — DESMOPRESSIN ACETATE 4 MCG/ML IJ SOLN
1.0000 ug | Freq: Once | INTRAMUSCULAR | Status: AC
  Administered 2014-11-01: 1 ug via INTRAVENOUS
  Filled 2014-11-01: qty 0.25

## 2014-11-01 MED ORDER — LEVETIRACETAM ER 500 MG PO TB24
500.0000 mg | ORAL_TABLET | Freq: Two times a day (BID) | ORAL | Status: DC
  Administered 2014-11-01 – 2014-11-06 (×10): 500 mg via ORAL
  Filled 2014-11-01 (×12): qty 1

## 2014-11-01 MED ORDER — BUTALBITAL-APAP-CAFFEINE 50-325-40 MG PO TABS
1.0000 | ORAL_TABLET | Freq: Four times a day (QID) | ORAL | Status: DC | PRN
  Administered 2014-11-05: 2 via ORAL
  Filled 2014-11-01: qty 2

## 2014-11-01 MED ORDER — OXYCODONE-ACETAMINOPHEN 5-325 MG PO TABS
1.0000 | ORAL_TABLET | ORAL | Status: DC | PRN
  Administered 2014-11-01: 1 via ORAL
  Administered 2014-11-01 – 2014-11-06 (×10): 2 via ORAL
  Filled 2014-11-01: qty 1
  Filled 2014-11-01 (×10): qty 2

## 2014-11-01 NOTE — Plan of Care (Signed)
Problem: Consults Goal: Diagnosis - Craniotomy Outcome: Completed/Met Date Met:  11/01/14 Tumor removal

## 2014-11-01 NOTE — Care Management Note (Signed)
Case Management Note  Patient Details  Name: Amanda Murphy MRN: [id number] Date of Birth: [birth year]  Subjective/Objective:      Pt admitted on 10/31/14 s/p craniotomy and resection of brain tumor.  PTA, pt independent and from home; has supportive family.            Action/Plan: Will follow for discharge planning as pt progresses.    Expected Discharge Date:                  Expected Discharge Plan:  Home/Self Care  In-House Referral:     Discharge planning Services  CM Consult  Post Acute Care Choice:    Choice offered to:     DME Arranged:    DME Agency:     HH Arranged:    HH Agency:     Status of Service:  In process, will continue to follow  Medicare Important Message Given:    Date Medicare IM Given:    Medicare IM give by:    Date Additional Medicare IM Given:    Additional Medicare Important Message give by:     If discussed at [place] of Stay Meetings, dates discussed:    Additional Comments:  Reinaldo Raddle, RN, BSN  Trauma/Neuro ICU Case Manager [phone]

## 2014-11-01 NOTE — Progress Notes (Signed)
Subjective: Patient sitting up on the side of the bed, eating dinner. Ambulated once in the ICU today. Had increased urine output and decreasing urine specific gravity (1.000-1.001). BMET requested, but also given DDAVP 1 g IV. Thirsty, but trying to keep up with by mouth fluid intake.  feels vision is improved as compared to prior surgery. Results of BMET returned after DDAVP ordered:  Sodium 143, potassium 4.2. Tolerating regular diet well.  Objective: Vital signs in last 24 hours: Filed Vitals:   11/01/14 1500 11/01/14 1600 11/01/14 1607 11/01/14 1700  BP: 136/67 123/66  129/74  Pulse: 78   80  Temp:   99.1 F (37.3 C)   TempSrc:   Oral   Resp: 24   23  Height:      Weight:      SpO2: 95%   95%    Intake/Output from previous day: 05/25 0701 - 05/26 0700 In: 6400 [P.O.:2680; I.V.:3415; IV Piggyback:305] Out: 8560 [Urine:8060; Blood:500] Intake/Output this shift: Total I/O In: 4745 [P.O.:3640; I.V.:1000; IV Piggyback:105] Out: 4575 [Urine:4575]  Physical Exam:  Awake and alert, oriented. Following commands. Moving all 4 extremity is well. Dressing clean and dry.  CBC  Recent Labs  10/31/14 0949 11/01/14 0704  WBC  --  21.7*  HGB 11.9* 12.0  HCT 35.0* 36.7  PLT  --  348   BMET  Recent Labs  11/01/14 0704 11/01/14 1607  NA 142 143  K 3.9 4.2  CL 113* 111  CO2 21* 23  GLUCOSE 139* 141*  BUN 6 6  CREATININE 0.84 0.87  CALCIUM 8.2* 8.6*    Assessment/Plan: We'll continue close monitoring of I's and O's. BMET requested for 0500. Encouraged to ambulate at least once more this evening in the ICU. We'll change IV to saline lock, and allow patient to keep up with by mouth fluid needs.   Hosie Spangle, MD 11/01/2014, 6:13 PM

## 2014-11-01 NOTE — Progress Notes (Signed)
Subjective: Patient resting in bed comfortably. BMET last night showed sodium had risen to 151, and output significantly exceeded input. Patient complaining of thirst. Given 1 g of DDAVP, and urine output normalized. Patient allowed to drink, and thirst is improved.  0500 laboratories not yet done, ordered changed to stat.  Objective: Vital signs in last 24 hours: Filed Vitals:   11/01/14 0400 11/01/14 0500 11/01/14 0600 11/01/14 0700  BP: 117/74  121/80 130/76  Pulse: 77 70 85 88  Temp: 98.7 F (37.1 C)     TempSrc: Oral     Resp: 11 17 21 22   Height:      Weight:      SpO2: 96% 97% 97% 97%    Intake/Output from previous day: 05/25 0701 - 05/26 0700 In: 6240 [P.O.:2620; I.V.:3315; IV Piggyback:305] Out: 8410 [Urine:7910; Blood:500] Intake/Output this shift:    Physical Exam:  Awake and more alert. Oriented. Following commands. Moving all extremities well. Dressing clean and dry.  CBC  Recent Labs  10/31/14 0915 10/31/14 0949  HGB 11.9* 11.9*  HCT 35.0* 35.0*   BMET  Recent Labs  10/31/14 0949  10/31/14 1532 10/31/14 2010  NA 138  --   --  151*  K 3.1*  --   --  3.9  CL  --   --   --  121*  CO2  --   --   --  22  GLUCOSE  --   < > 127* 155*  BUN  --   --   --  7  CREATININE  --   --   --  0.95  CALCIUM  --   --   --  9.0  < > = values in this interval not displayed.   Assessment/Plan: We'll begin out of bed to chair in advance to ambulation. Will advance to a regular diet. We'll continue strict ice notes, and we'll leave Foley and to monitor urine output. We'll DC a line.   Hosie Spangle, MD 11/01/2014, 7:11 AM

## 2014-11-02 LAB — BASIC METABOLIC PANEL
Anion gap: 8 (ref 5–15)
Anion gap: 9 (ref 5–15)
Anion gap: 7 (ref 5–15)
BUN: 7 mg/dL (ref 6–20)
BUN: 7 mg/dL (ref 6–20)
BUN: 9 mg/dL (ref 6–20)
CO2: 22 mmol/L (ref 22–32)
CO2: 22 mmol/L (ref 22–32)
CO2: 24 mmol/L (ref 22–32)
Calcium: 8.3 mg/dL — ABNORMAL LOW (ref 8.9–10.3)
Calcium: 8.7 mg/dL — ABNORMAL LOW (ref 8.9–10.3)
Calcium: 8.7 mg/dL — ABNORMAL LOW (ref 8.9–10.3)
Chloride: 107 mmol/L (ref 101–111)
Chloride: 111 mmol/L (ref 101–111)
Chloride: 110 mmol/L (ref 101–111)
Creatinine, Ser: 0.84 mg/dL (ref 0.44–1.00)
Creatinine, Ser: 0.88 mg/dL (ref 0.44–1.00)
Creatinine, Ser: 0.83 mg/dL (ref 0.44–1.00)
GFR calc Af Amer: >60 mL/min (ref >60)
GFR calc Af Amer: >60 mL/min (ref >60)
GFR calc Af Amer: >60 mL/min (ref >60)
GFR calc non Af Amer: >60 mL/min (ref >60)
GFR calc non Af Amer: >60 mL/min (ref >60)
GFR calc non Af Amer: >60 mL/min (ref >60)
Glucose, Bld: 123 mg/dL — ABNORMAL HIGH (ref 65–99)
Glucose, Bld: 148 mg/dL — ABNORMAL HIGH (ref 65–99)
Glucose, Bld: 123 mg/dL — ABNORMAL HIGH (ref 65–99)
Potassium: 4 mmol/L (ref 3.5–5.1)
Potassium: 3.8 mmol/L (ref 3.5–5.1)
Potassium: 4.3 mmol/L (ref 3.5–5.1)
Sodium: 137 mmol/L (ref 135–145)
Sodium: 142 mmol/L (ref 135–145)
Sodium: 141 mmol/L (ref 135–145)

## 2014-11-02 MED ORDER — PANTOPRAZOLE SODIUM 40 MG PO TBEC
40.0000 mg | DELAYED_RELEASE_TABLET | Freq: Every day | ORAL | Status: DC
  Administered 2014-11-02 – 2014-11-05 (×4): 40 mg via ORAL
  Filled 2014-11-02 (×4): qty 1

## 2014-11-02 NOTE — Progress Notes (Signed)
Subjective: Patient resting comfortably in bed, mild headache. Urine output increased again, but sodium 137 at 0300. He gravity at 0630 was 1.004. Stat BMET requested at 0650, not yet drawn. 950 mL of urine output between 0630 and 0730, but with a specific gravity of 1.003. Gradually increasing ambulation.  Objective: Vital signs in last 24 hours: Filed Vitals:   11/02/14 0400 11/02/14 0600 11/02/14 0700 11/02/14 0718  BP: 134/82 147/84 129/70   Pulse: 88 92 98   Temp: 98.4 F (36.9 C)   99.3 F (37.4 C)  TempSrc: Oral   Oral  Resp: 12 25 25    Height:      Weight:      SpO2: 95% 95% 95%     Intake/Output from previous day: 05/26 0701 - 05/27 0700 In: 6160 [P.O.:4880; I.V.:1175; IV Piggyback:105] Out: 3953 [Urine:7285] Intake/Output this shift: Total I/O In: 240 [P.O.:240] Out: 950 [Urine:950]  Physical Exam:  Patient awake and alert, oriented. Speech fluent. Following commands. Moving all extremies well.  CBC  Recent Labs  10/31/14 0949 11/01/14 0704  WBC  --  21.7*  HGB 11.9* 12.0  HCT 35.0* 36.7  PLT  --  348   BMET  Recent Labs  11/01/14 1607 11/02/14 0300  NA 143 137  K 4.2 4.0  CL 111 107  CO2 23 22  GLUCOSE 141* 123*  BUN 6 7  CREATININE 0.87 0.84  CALCIUM 8.6* 8.3*    Assessment/Plan: Awaiting BMET to be drawn. We will assess whether further doses of DDAVP needed. We'll recheck BMET this afternoon and again in a.m. encouraged to ambulate at least half a dozen times in the ICU. We'll leave Foley to straight drainage to continue strict I's and O's.   Hosie Spangle, MD 11/02/2014, 7:42 AM

## 2014-11-03 LAB — BASIC METABOLIC PANEL
Anion gap: 8 (ref 5–15)
BUN: 12 mg/dL (ref 6–20)
CO2: 24 mmol/L (ref 22–32)
Calcium: 8.7 mg/dL — ABNORMAL LOW (ref 8.9–10.3)
Chloride: 109 mmol/L (ref 101–111)
Creatinine, Ser: 0.79 mg/dL (ref 0.44–1.00)
GFR calc Af Amer: >60 mL/min (ref >60)
GFR calc non Af Amer: >60 mL/min (ref >60)
Glucose, Bld: 111 mg/dL — ABNORMAL HIGH (ref 65–99)
Potassium: 4.2 mmol/L (ref 3.5–5.1)
Sodium: 141 mmol/L (ref 135–145)

## 2014-11-03 MED ORDER — DEXAMETHASONE 2 MG PO TABS
2.0000 mg | ORAL_TABLET | Freq: Every day | ORAL | Status: DC
  Administered 2014-11-06: 2 mg via ORAL
  Filled 2014-11-03: qty 1

## 2014-11-03 MED ORDER — DEXAMETHASONE 2 MG PO TABS
2.0000 mg | ORAL_TABLET | Freq: Two times a day (BID) | ORAL | Status: AC
  Administered 2014-11-04 – 2014-11-05 (×3): 2 mg via ORAL
  Filled 2014-11-03 (×4): qty 1

## 2014-11-03 MED ORDER — DEXAMETHASONE 4 MG PO TABS: 4.0000 mg | ORAL_TABLET | Freq: Two times a day (BID) | ORAL | Status: DC

## 2014-11-03 MED ORDER — DEXAMETHASONE 4 MG PO TABS
4.0000 mg | ORAL_TABLET | Freq: Two times a day (BID) | ORAL | Status: AC
  Administered 2014-11-03 (×2): 4 mg via ORAL
  Filled 2014-11-03 (×2): qty 1

## 2014-11-03 NOTE — Progress Notes (Signed)
Subjective: Patient resting in bed comfortably. Continues to have high urine outputs, but specific gravity has gradually increased to 1.004. Patient describes thirst, but is keeping up with by mouth intake. Instructed to avoid sugared beverages such as regular soda, apple juice, etc., but can have water, diet soda, diet green tea, etc.  This morning sodium 141, potassium 4.2.  Patient's ambulated in the ICU about half a dozen times yesterday.   Decadron taper ordered.  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 Slides reviewed with Dr. Donato Heinz  Objective: Vital signs in last 24 hours: Filed Vitals:   11/03/14 0000 11/03/14 0200 11/03/14 0400 11/03/14 0500  BP: 117/59 134/72 147/62   Pulse: 75  84 73  Temp: 98.2 F (36.8 C)     TempSrc: Oral     Resp: 21 19 22 23   Height:      Weight:      SpO2: 96%  96% 98%    Intake/Output from previous day: 05/27 0701 - 05/28 0700 In: 40981 [P.O.:11460] Out: 19147 [Urine:15680] Intake/Output this shift:    Physical Exam:  Awake and alert, oriented. Following commands. Moving all 4 extremities well. Dressing removed, wound clean and dry, will leave open to air.  CBC  Recent Labs  10/31/14 0949 11/01/14 0704  WBC  --  21.7*  HGB 11.9* 12.0  HCT 35.0* 36.7  PLT  --  348   BMET  Recent Labs  11/02/14 1704 11/03/14 0220  NA 141 141  K 4.3 4.2  CL 110 109  CO2 24 24  GLUCOSE 123* 111*  BUN 9 12  CREATININE 0.83 0.79  CALCIUM 8.7* 8.7*    Assessment/Plan: Stable from craniotomy for resection of craniopharyngioma, other than for diabetes insipidus. Patient continues to be able to compensate for high urine outputs with her by mouth intake. We'll plan on checking BMET again in a.m. Encouraged to ambulate at least a half a dozen times.    Hosie Spangle, MD 11/03/2014, 7:23 AM

## 2014-11-04 LAB — BASIC METABOLIC PANEL
Anion gap: 10 (ref 5–15)
BUN: 13 mg/dL (ref 6–20)
CO2: 24 mmol/L (ref 22–32)
Calcium: 9.2 mg/dL (ref 8.9–10.3)
Chloride: 109 mmol/L (ref 101–111)
Creatinine, Ser: 0.83 mg/dL (ref 0.44–1.00)
GFR calc Af Amer: >60 mL/min (ref >60)
GFR calc non Af Amer: >60 mL/min (ref >60)
Glucose, Bld: 99 mg/dL (ref 65–99)
Potassium: 4.1 mmol/L (ref 3.5–5.1)
Sodium: 143 mmol/L (ref 135–145)

## 2014-11-04 NOTE — Progress Notes (Signed)
Pt received from 20m. Order in chartfor urine specific gravity q4 hours. MD notified that we are not able to perform this test on the floor but that we could send the sample to the lab for the test to be performed. MD gave verbal order for the urine specific gravity to be discontinued and to order an electrolyte panel in the am. Orders entered.

## 2014-11-04 NOTE — Progress Notes (Signed)
Patient ID: Amanda Murphy, female   DOB: 01-Dec-1980, 34 y.o.   MRN: [id number] Neuro stable. Foley in . sodiun 143.drinking to keep up with  output

## 2014-11-05 LAB — BASIC METABOLIC PANEL
ANION GAP: 17 — AB (ref 5–15)
BUN: 13 mg/dL (ref 6–20)
CHLORIDE: 107 mmol/L (ref 101–111)
CO2: 21 mmol/L — ABNORMAL LOW (ref 22–32)
Calcium: 8.9 mg/dL (ref 8.9–10.3)
Creatinine, Ser: 0.84 mg/dL (ref 0.44–1.00)
GFR calc non Af Amer: >60 mL/min (ref >60)
Glucose, Bld: 93 mg/dL (ref 65–99)
POTASSIUM: 4.2 mmol/L (ref 3.5–5.1)
Sodium: 145 mmol/L (ref 135–145)

## 2014-11-05 MED ORDER — ACETAMINOPHEN 325 MG PO TABS: 650.0000 mg | ORAL_TABLET | Freq: Four times a day (QID) | ORAL | Status: DC | PRN

## 2014-11-05 NOTE — Progress Notes (Signed)
Patient ID: Amanda Murphy, female   DOB: 05-Sep-1980, 34 y.o.   MRN: [id number] Stable, minimal headache. Catching up with po intake. Foley tobe removed. Sodium pending

## 2014-11-06 ENCOUNTER — Ambulatory Visit: Payer: 59 | Admitting: Internal Medicine

## 2014-11-06 MED ORDER — OXYCODONE-ACETAMINOPHEN 5-325 MG PO TABS: 1.0000 | ORAL_TABLET | ORAL | Status: DC | PRN

## 2014-11-06 MED ORDER — LEVETIRACETAM ER 500 MG PO TB24: 500.0000 mg | ORAL_TABLET | Freq: Two times a day (BID) | ORAL | Status: DC

## 2014-11-06 NOTE — Progress Notes (Signed)
Every other staples removed per order. Total 17 staples removed. Patient tolerated well.    Ave Filter, RN

## 2014-11-06 NOTE — Progress Notes (Signed)
Discharge instruction reviewed with patient/family. All questions answered at this time. RXs given. Transport home per family.  Ave Filter, RN

## 2014-11-06 NOTE — Discharge Summary (Signed)
Physician Discharge Summary  Patient ID: Amanda Murphy MRN: [id number] DOB/AGE: May 12, 1981 34 y.o.  Admit date: 10/31/2014 Discharge date: 11/06/2014  Admission Diagnoses:  Right suprasellar cystic tumor, hemianopsia  Discharge Diagnoses:  Right suprasellar cystic tumor, craniopharyngioma, hemianopsia, diabetes insipidus (compensated) Active Problems:   Brain tumor   Discharged Condition: good  Hospital Course: Patient was admitted, underwent a right pterional craniotomy and gross total resection of a right suprasellar cystic tumor. Postoperatively she's had increased urine output, and did require a couple of doses of DDAVP over the first 36 hours following surgery, but has not needed any further DDAVP since, and at this point she is compensating for the increased urine output with increased by mouth fluid intake. Her electrolytes have shown her sodium to be relatively stable, in a range of 141-145. She experiences thirst at times and is able to respond to it. Otherwise she is up and ambulating actively. She is recovered well from her surgery. Her wound is healing nicely, we've asked the nursing staff to remove half of her staples prior to discharge. Her Decadron has been tapered, and she'll be given a final dose prior to discharge. She has been treated with Keppra 500 mg twice a day for anticonvulsive prophylaxis. She's been given a prescription for Keppra to take 500 mg twice a day for 3 weeks, then 500 mg daily for 2 weeks, and then discontinue. Pathology has reported a craniopharyngioma. She is to return for follow-up with me in the office in 3 days for removal of the remainder of her staples. She's been given instructions regarding wound care and activities following discharge.  Discharge Exam: Blood pressure 118/63, pulse 71, temperature 98.8 F (37.1 C), temperature source Oral, resp. rate 18, height 5\' 3"  (1.6 m), weight 110.7 kg (244 lb 0.8 oz), last menstrual period 10/06/2014, SpO2 99  %.  Disposition: Home     Medication List    TAKE these medications        acetaminophen 500 MG tablet  Commonly known as:  TYLENOL  Take 1,000 mg by mouth every 6 (six) hours as needed for mild pain.     ALPRAZolam 0.5 MG tablet  Commonly known as:  XANAX  Take 1 tablet (0.5 mg total) by mouth at bedtime as needed for anxiety.     ciprofloxacin 500 MG tablet  Commonly known as:  CIPRO  Take 500 mg by mouth as needed (for "traveller's diarrhea" Pt reports taking 3 doses during trip to [place].).     docusate sodium 100 MG capsule  Commonly known as:  COLACE  Take 100 mg by mouth 2 (two) times daily as needed for mild constipation.     etonogestrel-ethinyl estradiol 0.12-0.015 MG/24HR vaginal ring  Commonly known as:  [place] 1 each vaginally every 28 (twenty-eight) days. Insert vaginally and leave in place for 3 consecutive weeks, then remove for 1 week.     fluticasone 50 MCG/ACT nasal spray  Commonly known as:  FLONASE  Place 2 sprays into both nostrils daily.     levETIRAcetam 500 MG 24 hr tablet  Commonly known as:  KEPPRA XR  Take 1 tablet (500 mg total) by mouth 2 (two) times daily. 1 tablet twice a day for 3 weeks, then 1 tablet daily for 2 weeks, then discontinue     levocetirizine 5 MG tablet  Commonly known as:  XYZAL  Take 1 tablet (5 mg total) by mouth every evening.     multivitamin with minerals Tabs  tablet  Take 1 tablet by mouth daily.     naproxen sodium 220 MG tablet  Commonly known as:  ANAPROX  Take 440 mg by mouth daily as needed (pain, headache).     oxyCODONE-acetaminophen 5-325 MG per tablet  Commonly known as:  PERCOCET/ROXICET  Take 1-2 tablets by mouth every 4 (four) hours as needed for moderate pain.     VITAMIN B 12 PO  Take 1 tablet by mouth daily.         Signed: Hosie Spangle, MD 11/06/2014, 8:27 AM

## 2014-11-11 ENCOUNTER — Emergency Department (HOSPITAL_COMMUNITY)
Admission: EM | Admit: 2014-11-11 | Discharge: 2014-11-11 | Disposition: A | Discharge status: Home / Self Care | Payer: 59 | Attending: Emergency Medicine | Admitting: Emergency Medicine

## 2014-11-11 ENCOUNTER — Encounter (HOSPITAL_COMMUNITY): Payer: Self-pay | Admitting: Emergency Medicine

## 2014-11-11 ENCOUNTER — Emergency Department (HOSPITAL_BASED_OUTPATIENT_CLINIC_OR_DEPARTMENT_OTHER)
Admit: 2014-11-11 | Discharge: 2014-11-11 | Disposition: A | Discharge status: Home / Self Care | Payer: 59 | Attending: Emergency Medicine | Admitting: Emergency Medicine

## 2014-11-11 DIAGNOSIS — Z79899 Other long term (current) drug therapy: Secondary | ICD-10-CM | POA: Insufficient documentation

## 2014-11-11 DIAGNOSIS — Z86018 Personal history of other benign neoplasm: Secondary | ICD-10-CM | POA: Insufficient documentation

## 2014-11-11 DIAGNOSIS — M7989 Other specified soft tissue disorders: Secondary | ICD-10-CM | POA: Insufficient documentation

## 2014-11-11 DIAGNOSIS — J45909 Unspecified asthma, uncomplicated: Secondary | ICD-10-CM | POA: Insufficient documentation

## 2014-11-11 DIAGNOSIS — Z7951 Long term (current) use of inhaled steroids: Secondary | ICD-10-CM | POA: Diagnosis not present

## 2014-11-11 DIAGNOSIS — Z8639 Personal history of other endocrine, nutritional and metabolic disease: Secondary | ICD-10-CM | POA: Diagnosis not present

## 2014-11-11 DIAGNOSIS — Z8701 Personal history of pneumonia (recurrent): Secondary | ICD-10-CM | POA: Diagnosis not present

## 2014-11-11 DIAGNOSIS — R220 Localized swelling, mass and lump, head: Secondary | ICD-10-CM | POA: Insufficient documentation

## 2014-11-11 DIAGNOSIS — D649 Anemia, unspecified: Secondary | ICD-10-CM | POA: Diagnosis not present

## 2014-11-11 DIAGNOSIS — Z8669 Personal history of other diseases of the nervous system and sense organs: Secondary | ICD-10-CM | POA: Insufficient documentation

## 2014-11-11 DIAGNOSIS — R002 Palpitations: Secondary | ICD-10-CM | POA: Diagnosis not present

## 2014-11-11 LAB — BASIC METABOLIC PANEL
Anion gap: 10 (ref 5–15)
BUN: 10 mg/dL (ref 6–20)
CHLORIDE: 100 mmol/L — AB (ref 101–111)
CO2: 23 mmol/L (ref 22–32)
CREATININE: 0.73 mg/dL (ref 0.44–1.00)
Calcium: 8.1 mg/dL — ABNORMAL LOW (ref 8.9–10.3)
GFR calc Af Amer: >60 mL/min (ref >60)
GFR calc non Af Amer: >60 mL/min (ref >60)
Glucose, Bld: 132 mg/dL — ABNORMAL HIGH (ref 65–99)
Potassium: 4.1 mmol/L (ref 3.5–5.1)
SODIUM: 133 mmol/L — AB (ref 135–145)

## 2014-11-11 LAB — CBC
HEMATOCRIT: 31.9 % — AB (ref 36.0–46.0)
Hemoglobin: 10.3 g/dL — ABNORMAL LOW (ref 12.0–15.0)
MCH: 27 pg (ref 26.0–34.0)
MCHC: 32.3 g/dL (ref 30.0–36.0)
MCV: 83.7 fL (ref 78.0–100.0)
PLATELETS: 368 10*3/uL (ref 150–400)
RBC: 3.81 MIL/uL — AB (ref 3.87–5.11)
RDW: 16.4 % — ABNORMAL HIGH (ref 11.5–15.5)
WBC: 13.3 10*3/uL — AB (ref 4.0–10.5)

## 2014-11-11 LAB — I-STAT TROPONIN, ED: Troponin i, poc: 0 ng/mL (ref 0.00–0.08)

## 2014-11-11 MED ORDER — OXYCODONE-ACETAMINOPHEN 5-325 MG PO TABS
2.0000 | ORAL_TABLET | Freq: Once | ORAL | Status: AC
  Administered 2014-11-11: 2 via ORAL
  Filled 2014-11-11: qty 2

## 2014-11-11 NOTE — Discharge Instructions (Signed)
You have no evidence of blood clot on ultrasound.  You are slightly anemic-possible due to recent surgery.  Please take iron and have your hemoglobin rechecked by your doctor.

## 2014-11-11 NOTE — ED Notes (Signed)
Patient arrives with complaint of right face swelling and left calf swelling. Hx of right craniotomy <1 week ago. Right eye became more swollen today below sight of craniotomy. Left calf pain has began after leaving hospital and has worsened; swelling noted yesterday with increased pain. Not currently on blood thinner. Calf tender, homans sign present, edema noted.

## 2014-11-11 NOTE — ED Notes (Signed)
Pt with recent R sided craniotomy on 5/26; reports having increased swelling to LLE and tenderness. Denies chest pain or shortness of breath. Also c/o R sided facial numbness that has since resolved. Pt alert and oriented x 4. No neuro deficits noted at this time.

## 2014-11-11 NOTE — ED Provider Notes (Signed)
CSN: [id number]     Arrival date & time 11/11/14  0035 History   None    This chart was scribed for Ripley Fraise, MD by Forrestine Him, ED Scribe. This patient was seen in room B16C/B16C and the patient's care was started 3:27 AM.   Chief Complaint  Patient presents with  . Leg Swelling  . Facial Swelling  . Palpitations   The history is provided by the patient. No language interpreter was used.    HPI Comments: Amanda Murphy is a 34 y.o. female who presents to the Emergency Department complaining of constant, ongoing, progressively worsening bilateral lower extremity swelling; L greater than R x 1 day. No recent injury or trauma. She also reports mild palpitations and R sided facial/eye swelling that has improved over the course of the day. Prescribed Percocet attempted prior to arrival with mild improvement for discomfort. [name] underwent a craniotomy to remove a tumor 10/31/2014. 1 week prior to surgery, pt traveled to and from [place]. She denies any fever, chills, chest pain, shortness of breath, or abdominal pain at this time. No history of blood clots. Currently she uses Nuvaring monthly as contraceptive. No known allergies to medications.  Past Medical History  Diagnosis Date  . Vision abnormalities   . Headache(784.0)   . PCOS (polycystic ovarian syndrome)   . Fibroids   . Astigmatism   . Asthma     as child   . Pneumonia     3-4 yrs ago    Past Surgical History  Procedure Laterality Date  . Wisdom tooth extraction  2006  . Myomectomy    . Craniotomy N/A 10/31/2014    Procedure: CRANIOTOMY FOR RESECTION OF TUMOR;  Surgeon: Jovita Gamma, MD;  Location: [facility];  Service: Neurosurgery;  Laterality: N/A;  Craniotomy for resection of tumor   Family History  Problem Relation Age of Onset  . Hypertension Mother   . Diabetes Mother   . Ovarian cancer    . Breast cancer    . Fibroids     History  Substance Use Topics  . Smoking status: Never Smoker   .  Smokeless tobacco: Never Used  . Alcohol Use: 0.0 oz/week    0 Standard drinks or equivalent per week     Comment: occasional   OB History    No data available     Review of Systems  Constitutional: Negative for fever and chills.  HENT: Positive for facial swelling.   Respiratory: Negative for cough and shortness of breath.   Cardiovascular: Positive for palpitations and leg swelling. Negative for chest pain.  Gastrointestinal: Negative for nausea, vomiting and diarrhea.  Skin: Negative for rash.  Psychiatric/Behavioral: Negative for confusion.  All other systems reviewed and are negative.     Allergies  Review of patient's allergies indicates no known allergies.  Home Medications   Prior to Admission medications   Medication Sig Start Date End Date Taking? Authorizing Provider  acetaminophen (TYLENOL) 500 MG tablet Take 1,000 mg by mouth every 6 (six) hours as needed for mild pain.    Historical Provider, MD  ALPRAZolam Duanne Moron) 0.5 MG tablet Take 1 tablet (0.5 mg total) by mouth at bedtime as needed for anxiety. 10/19/14   Olga Millers, MD  ciprofloxacin (CIPRO) 500 MG tablet Take 500 mg by mouth as needed (for "traveller's diarrhea" Pt reports taking 3 doses during trip to [place].).    Historical Provider, MD  Cyanocobalamin (VITAMIN B 12 PO) Take 1  tablet by mouth daily.    Historical Provider, MD  docusate sodium (COLACE) 100 MG capsule Take 100 mg by mouth 2 (two) times daily as needed for mild constipation.    Historical Provider, MD  etonogestrel-ethinyl estradiol (NUVARING) 0.12-0.015 MG/24HR vaginal ring Place 1 each vaginally every 28 (twenty-eight) days. Insert vaginally and leave in place for 3 consecutive weeks, then remove for 1 week.    Historical Provider, MD  fluticasone (FLONASE) 50 MCG/ACT nasal spray Place 2 sprays into both nostrils daily. Patient taking differently: Place 2 sprays into both nostrils daily as needed for allergies or rhinitis.  08/16/14    Olga Millers, MD  levETIRAcetam (KEPPRA XR) 500 MG 24 hr tablet Take 1 tablet (500 mg total) by mouth 2 (two) times daily. 1 tablet twice a day for 3 weeks, then 1 tablet daily for 2 weeks, then discontinue 11/06/14   Jovita Gamma, MD  levocetirizine (XYZAL) 5 MG tablet Take 1 tablet (5 mg total) by mouth every evening. Patient taking differently: Take 5 mg by mouth daily as needed for allergies.  08/16/14   Olga Millers, MD  Multiple Vitamin (MULTIVITAMIN WITH MINERALS) TABS tablet Take 1 tablet by mouth daily.    Historical Provider, MD  naproxen sodium (ANAPROX) 220 MG tablet Take 440 mg by mouth daily as needed (pain, headache).     Historical Provider, MD  oxyCODONE-acetaminophen (PERCOCET/ROXICET) 5-325 MG per tablet Take 1-2 tablets by mouth every 4 (four) hours as needed for moderate pain. 11/06/14   Jovita Gamma, MD   Triage Vitals: BP 125/81 mmHg  Pulse 104  Temp(Src) 98.2 F (36.8 C) (Oral)  Resp 16  SpO2 98%  LMP 10/02/2014 (Exact Date)   Physical Exam  CONSTITUTIONAL: Well developed/well nourished HEAD: Mild soft tissue swelling to R forehead. No bruising or erythema  EYES: EOMI/PERRL ENMT: Mucous membranes moist NECK: supple no meningeal signs SPINE/BACK:entire spine nontender CV: S1/S2 noted, no murmurs/rubs/gallops noted LUNGS: Lungs are clear to auscultation bilaterally, no apparent distress ABDOMEN: soft, nontender, no rebound or guarding, bowel sounds noted throughout abdomen NEURO: Pt is awake/alert/appropriate, moves all extremitiesx4.  No facial droop.   EXTREMITIES: pulses normal/equal, full ROM. Pitting edema; Left greater than Right. L calf tenderness. No erythema noted SKIN: warm, color normal PSYCH: no abnormalities of mood noted, alert and oriented to situation   ED Course  Procedures  DIAGNOSTIC STUDIES: Oxygen Saturation is 98% on RA, Normal by my interpretation.    7:04 AM Pt with findings concerning for DVT She has multiple risk  factors for DVT (travel, recent surgery, contraceptives)  however due to recent craniotomy she was not candidate for empiric lovenox At signout to dr ray, f/u on DVT study.  If postive will need to d/w dr Sherwood Gambler concerning anticoagulation safety  Labs Review Labs Reviewed  CBC - Abnormal; Notable for the following:    WBC 13.3 (*)    RBC 3.81 (*)    Hemoglobin 10.3 (*)    HCT 31.9 (*)    RDW 16.4 (*)    All other components within normal limits  BASIC METABOLIC PANEL - Abnormal; Notable for the following:    Sodium 133 (*)    Chloride 100 (*)    Glucose, Bld 132 (*)    Calcium 8.1 (*)    All other components within normal limits  I-STAT TROPOININ, ED    Medications  oxyCODONE-acetaminophen (PERCOCET/ROXICET) 5-325 MG per tablet 2 tablet (2 tablets Oral Given 11/11/14 0339)  MDM   Final diagnoses:  Swelling of left lower extremity    Nursing notes including past medical history and social history reviewed and considered in documentation Labs/vital reviewed myself and considered during evaluation   I personally performed the services described in this documentation, which was scribed in my presence. The recorded information has been reviewed and is accurate.      Ripley Fraise, MD 11/11/14 [phone]

## 2014-11-11 NOTE — ED Provider Notes (Signed)
EKG Interpretation  Date/Time:  Sunday November 11 2014 01:02:33 EDT Ventricular Rate:  101 PR Interval:  130 QRS Duration: 70 QT Interval:  336 QTC Calculation: 435 R Axis:   76 Text Interpretation:  Sinus tachycardia Otherwise normal ECG No previous ECGs available Confirmed by Christy Gentles  MD, Atiyana Welte ([id number]) on 11/11/2014 3:26:02 AM        Ripley Fraise, MD 11/11/14 [phone]

## 2014-11-11 NOTE — Progress Notes (Addendum)
VASCULAR LAB PRELIMINARY  PRELIMINARY  PRELIMINARY  PRELIMINARY  Bilateral lower extremity venous Dopplers completed.    Preliminary report:  There is no obvious evidence of DVT or SVT noted in the bilateral lower extremites.  Amanda Murphy, RVT 11/11/2014, 7:46 AM

## 2014-11-11 NOTE — ED Provider Notes (Signed)
34 year old female signed out to me by Dr. Christy Gentles with venous Dopplers pending. She has had some diffuse swelling and has been on a long trip, recent surgery, and is on hormone replacement therapy. Dopplers are negative. She is stable for discharge and is instructed regarding follow-up.  [name], MD 11/11/14 1044

## 2014-11-11 NOTE — ED Notes (Signed)
Additionally reports intermittent palpitations yesterday, mildly tachycardic in triage.

## 2014-11-23 ENCOUNTER — Inpatient Hospital Stay (HOSPITAL_COMMUNITY)
Admission: EM | Admit: 2014-11-23 | Discharge: 2014-11-29 | DRG: 176 | Disposition: A | Discharge status: Home / Self Care | Payer: 59 | Attending: Pulmonary Disease | Admitting: Pulmonary Disease

## 2014-11-23 ENCOUNTER — Inpatient Hospital Stay (HOSPITAL_COMMUNITY): Payer: 59

## 2014-11-23 ENCOUNTER — Encounter (HOSPITAL_COMMUNITY): Payer: Self-pay | Admitting: Neurology

## 2014-11-23 ENCOUNTER — Emergency Department (HOSPITAL_COMMUNITY): Payer: 59

## 2014-11-23 ENCOUNTER — Inpatient Hospital Stay (INDEPENDENT_AMBULATORY_CARE_PROVIDER_SITE_OTHER): Payer: 59

## 2014-11-23 DIAGNOSIS — D72829 Elevated white blood cell count, unspecified: Secondary | ICD-10-CM | POA: Diagnosis present

## 2014-11-23 DIAGNOSIS — Z79891 Long term (current) use of opiate analgesic: Secondary | ICD-10-CM | POA: Diagnosis not present

## 2014-11-23 DIAGNOSIS — E872 Acidosis: Secondary | ICD-10-CM | POA: Diagnosis present

## 2014-11-23 DIAGNOSIS — R739 Hyperglycemia, unspecified: Secondary | ICD-10-CM | POA: Diagnosis present

## 2014-11-23 DIAGNOSIS — R579 Shock, unspecified: Secondary | ICD-10-CM | POA: Diagnosis present

## 2014-11-23 DIAGNOSIS — Z79899 Other long term (current) drug therapy: Secondary | ICD-10-CM

## 2014-11-23 DIAGNOSIS — F419 Anxiety disorder, unspecified: Secondary | ICD-10-CM | POA: Diagnosis not present

## 2014-11-23 DIAGNOSIS — I959 Hypotension, unspecified: Secondary | ICD-10-CM | POA: Diagnosis not present

## 2014-11-23 DIAGNOSIS — E46 Unspecified protein-calorie malnutrition: Secondary | ICD-10-CM | POA: Diagnosis present

## 2014-11-23 DIAGNOSIS — Z86711 Personal history of pulmonary embolism: Secondary | ICD-10-CM | POA: Insufficient documentation

## 2014-11-23 DIAGNOSIS — Z791 Long term (current) use of non-steroidal anti-inflammatories (NSAID): Secondary | ICD-10-CM

## 2014-11-23 DIAGNOSIS — N179 Acute kidney failure, unspecified: Secondary | ICD-10-CM | POA: Diagnosis present

## 2014-11-23 DIAGNOSIS — R000 Tachycardia, unspecified: Secondary | ICD-10-CM | POA: Diagnosis present

## 2014-11-23 DIAGNOSIS — E230 Hypopituitarism: Secondary | ICD-10-CM | POA: Diagnosis present

## 2014-11-23 DIAGNOSIS — R55 Syncope and collapse: Secondary | ICD-10-CM

## 2014-11-23 DIAGNOSIS — Z6841 Body Mass Index (BMI) 40.0 and over, adult: Secondary | ICD-10-CM | POA: Diagnosis not present

## 2014-11-23 DIAGNOSIS — E232 Diabetes insipidus: Secondary | ICD-10-CM | POA: Diagnosis not present

## 2014-11-23 DIAGNOSIS — I2699 Other pulmonary embolism without acute cor pulmonale: Principal | ICD-10-CM | POA: Diagnosis present

## 2014-11-23 DIAGNOSIS — I82402 Acute embolism and thrombosis of unspecified deep veins of left lower extremity: Secondary | ICD-10-CM | POA: Diagnosis not present

## 2014-11-23 LAB — I-STAT ARTERIAL BLOOD GAS, ED
Acid-base deficit: 7 mmol/L — ABNORMAL HIGH (ref 0.0–2.0)
Bicarbonate: 15.7 mEq/L — ABNORMAL LOW (ref 20.0–24.0)
O2 Saturation: 95 %
PCO2 ART: 23.2 mmHg — AB (ref 35.0–45.0)
PH ART: 7.44 (ref 7.350–7.450)
TCO2: 16 mmol/L (ref 0–100)
pO2, Arterial: 70 mmHg — ABNORMAL LOW (ref 80.0–100.0)

## 2014-11-23 LAB — COMPREHENSIVE METABOLIC PANEL
ALT: 79 U/L — ABNORMAL HIGH (ref 14–54)
ANION GAP: 16 — AB (ref 5–15)
AST: 122 U/L — ABNORMAL HIGH (ref 15–41)
Albumin: 2.6 g/dL — ABNORMAL LOW (ref 3.5–5.0)
Alkaline Phosphatase: 88 U/L (ref 38–126)
BILIRUBIN TOTAL: 0.7 mg/dL (ref 0.3–1.2)
BUN: 10 mg/dL (ref 6–20)
CALCIUM: 8.9 mg/dL (ref 8.9–10.3)
CO2: 18 mmol/L — AB (ref 22–32)
Chloride: 108 mmol/L (ref 101–111)
Creatinine, Ser: 1.38 mg/dL — ABNORMAL HIGH (ref 0.44–1.00)
GFR, EST AFRICAN AMERICAN: 57 mL/min — AB (ref >60)
GFR, EST NON AFRICAN AMERICAN: 49 mL/min — AB (ref >60)
GLUCOSE: 149 mg/dL — AB (ref 65–99)
Potassium: 3.8 mmol/L (ref 3.5–5.1)
Sodium: 142 mmol/L (ref 135–145)
Total Protein: 7.3 g/dL (ref 6.5–8.1)

## 2014-11-23 LAB — CBG MONITORING, ED: Glucose-Capillary: 138 mg/dL — ABNORMAL HIGH (ref 65–99)

## 2014-11-23 LAB — CBC WITH DIFFERENTIAL/PLATELET
BASOS ABS: 0 10*3/uL (ref 0.0–0.1)
Basophils Relative: 0 % (ref 0–1)
EOS PCT: 2 % (ref 0–5)
Eosinophils Absolute: 0.3 10*3/uL (ref 0.0–0.7)
HCT: 40.9 % (ref 36.0–46.0)
Hemoglobin: 13.1 g/dL (ref 12.0–15.0)
LYMPHS PCT: 42 % (ref 12–46)
Lymphs Abs: 5 10*3/uL — ABNORMAL HIGH (ref 0.7–4.0)
MCH: 28.1 pg (ref 26.0–34.0)
MCHC: 32 g/dL (ref 30.0–36.0)
MCV: 87.8 fL (ref 78.0–100.0)
Monocytes Absolute: 0.4 10*3/uL (ref 0.1–1.0)
Monocytes Relative: 3 % (ref 3–12)
NEUTROS PCT: 53 % (ref 43–77)
Neutro Abs: 6.3 10*3/uL (ref 1.7–7.7)
PLATELETS: 298 10*3/uL (ref 150–400)
RBC: 4.66 MIL/uL (ref 3.87–5.11)
RDW: 19.2 % — AB (ref 11.5–15.5)
WBC: 12 10*3/uL — AB (ref 4.0–10.5)

## 2014-11-23 LAB — I-STAT CHEM 8, ED
BUN: 11 mg/dL (ref 6–20)
Calcium, Ion: 1.11 mmol/L — ABNORMAL LOW (ref 1.12–1.23)
Chloride: 110 mmol/L (ref 101–111)
Creatinine, Ser: 1.3 mg/dL — ABNORMAL HIGH (ref 0.44–1.00)
Glucose, Bld: 145 mg/dL — ABNORMAL HIGH (ref 65–99)
HEMATOCRIT: 45 % (ref 36.0–46.0)
Hemoglobin: 15.3 g/dL — ABNORMAL HIGH (ref 12.0–15.0)
Potassium: 3.7 mmol/L (ref 3.5–5.1)
Sodium: 142 mmol/L (ref 135–145)
TCO2: 17 mmol/L (ref 0–100)

## 2014-11-23 LAB — PROCALCITONIN

## 2014-11-23 LAB — I-STAT TROPONIN, ED: TROPONIN I, POC: 0.03 ng/mL (ref 0.00–0.08)

## 2014-11-23 LAB — LACTIC ACID, PLASMA: Lactic Acid, Venous: 4.6 mmol/L (ref 0.5–2.0)

## 2014-11-23 LAB — TROPONIN I: Troponin I: 0.84 ng/mL (ref <0.031)

## 2014-11-23 LAB — CORTISOL

## 2014-11-23 LAB — MRSA PCR SCREENING: MRSA by PCR: NEGATIVE

## 2014-11-23 LAB — I-STAT BETA HCG BLOOD, ED (MC, WL, AP ONLY): I-stat hCG, quantitative: <5 m[IU]/mL (ref <5)

## 2014-11-23 LAB — HEPARIN LEVEL (UNFRACTIONATED): Heparin Unfractionated: 0.35 IU/mL (ref 0.30–0.70)

## 2014-11-23 LAB — GLUCOSE, CAPILLARY: Glucose-Capillary: 71 mg/dL (ref 65–99)

## 2014-11-23 LAB — I-STAT CG4 LACTIC ACID, ED: LACTIC ACID, VENOUS: 5.83 mmol/L — AB (ref 0.5–2.0)

## 2014-11-23 MED ORDER — HEPARIN BOLUS VIA INFUSION
4000.0000 [IU] | Freq: Once | INTRAVENOUS | Status: AC
  Administered 2014-11-23: 4000 [IU] via INTRAVENOUS
  Filled 2014-11-23: qty 4000

## 2014-11-23 MED ORDER — CETYLPYRIDINIUM CHLORIDE 0.05 % MT LIQD
7.0000 mL | Freq: Two times a day (BID) | OROMUCOSAL | Status: DC
  Administered 2014-11-23 – 2014-11-25 (×3): 7 mL via OROMUCOSAL

## 2014-11-23 MED ORDER — NOREPINEPHRINE BITARTRATE 1 MG/ML IV SOLN
0.0000 ug/min | Freq: Once | INTRAVENOUS | Status: DC
  Filled 2014-11-23: qty 4

## 2014-11-23 MED ORDER — LEVETIRACETAM ER 500 MG PO TB24
500.0000 mg | ORAL_TABLET | Freq: Once | ORAL | Status: AC
  Administered 2014-11-23: 500 mg via ORAL
  Filled 2014-11-23: qty 1

## 2014-11-23 MED ORDER — LEVETIRACETAM ER 500 MG PO TB24
500.0000 mg | ORAL_TABLET | Freq: Two times a day (BID) | ORAL | Status: DC
  Administered 2014-11-23 – 2014-11-29 (×12): 500 mg via ORAL
  Filled 2014-11-23 (×15): qty 1

## 2014-11-23 MED ORDER — LEVETIRACETAM ER 500 MG PO TB24
500.0000 mg | ORAL_TABLET | Freq: Two times a day (BID) | ORAL | Status: DC
  Filled 2014-11-23 (×2): qty 1

## 2014-11-23 MED ORDER — DEXTROSE IN LACTATED RINGERS 5 % IV SOLN
INTRAVENOUS | Status: DC
  Administered 2014-11-23 – 2014-11-24 (×2): via INTRAVENOUS

## 2014-11-23 MED ORDER — HYDROCORTISONE NA SUCCINATE PF 100 MG IJ SOLR
100.0000 mg | Freq: Once | INTRAMUSCULAR | Status: AC
  Administered 2014-11-23: 100 mg via INTRAVENOUS
  Filled 2014-11-23: qty 2

## 2014-11-23 MED ORDER — DESMOPRESSIN ACETATE 0.1 MG PO TABS
0.0500 mg | ORAL_TABLET | Freq: Two times a day (BID) | ORAL | Status: DC
  Administered 2014-11-23 – 2014-11-26 (×6): 0.05 mg via ORAL
  Filled 2014-11-23 (×7): qty 1

## 2014-11-23 MED ORDER — HEPARIN (PORCINE) IN NACL 100-0.45 UNIT/ML-% IJ SOLN
1500.0000 [IU]/h | INTRAMUSCULAR | Status: AC
  Administered 2014-11-23: 1250 [IU]/h via INTRAVENOUS
  Administered 2014-11-25 (×2): 1350 [IU]/h via INTRAVENOUS
  Filled 2014-11-23 (×7): qty 250

## 2014-11-23 MED ORDER — ONDANSETRON HCL 4 MG/2ML IJ SOLN
4.0000 mg | Freq: Once | INTRAMUSCULAR | Status: AC
  Administered 2014-11-23: 4 mg via INTRAVENOUS
  Filled 2014-11-23: qty 2

## 2014-11-23 MED ORDER — LEVETIRACETAM ER 500 MG PO TB24
500.0000 mg | ORAL_TABLET | Freq: Two times a day (BID) | ORAL | Status: DC
  Filled 2014-11-23: qty 1

## 2014-11-23 MED ORDER — SODIUM CHLORIDE 0.9 % IV BOLUS (SEPSIS)
1000.0000 mL | Freq: Once | INTRAVENOUS | Status: AC
  Administered 2014-11-23: 1000 mL via INTRAVENOUS

## 2014-11-23 MED ORDER — IOHEXOL 350 MG/ML SOLN
100.0000 mL | Freq: Once | INTRAVENOUS | Status: AC | PRN
  Administered 2014-11-23: 100 mL via INTRAVENOUS

## 2014-11-23 MED ORDER — FLUTICASONE PROPIONATE 50 MCG/ACT NA SUSP
2.0000 | Freq: Every day | NASAL | Status: DC
  Administered 2014-11-23: 2 via NASAL
  Filled 2014-11-23: qty 16

## 2014-11-23 NOTE — Consult Note (Signed)
Reason for Consult:  Pulmonary embolism, recent craniotomy for resection of craniopharyngioma Referring Physician:  Dr. Quintella Reichert  Amanda Murphy is an 34 y.o. right-handed black female.  HPI: Patient brought by EMS to the [facility] emergency room after syncope today. Evaluated by Dr. Ralene Bathe (EDP), and found to have hypotension, tachycardia, and diaphoresis. Workup in the ER included CT angiogram chest which revealed large bilateral pulmonary emboli with evidence of right heart strain. Patient was started on IV heparin and has been admitted to the critical care medicine service.  Patient presented with right eye temporal hemianopsia and was found to have a cystic right suprasellar mass. She is now 23 days status post right pterional craniotomy and gross total resection of a craniopharyngioma. Her postoperative course was most notable for diabetes insipidus. She did receive a dose of DDAVP IV on the evening of surgery and again the following afternoon, but after that was able to handle the excess urine output by compensating with by mouth fluid intake. However when she was seen for postoperative follow-up, she described being tired of keeping up with the diabetes insipidus with by mouth fluid intake, and she was therefore referred to Dr. Dagmar [facility] endocrinology) for evaluation and management of her diabetes insipidus as well as reassessment of her overall pituitary axis, which preoperatively had been unremarkable. Dr. Buddy Duty had started her on DDAVP 0.05 mg by mouth twice a day this week.  Currently the patient denies complaints regarding her head. She does describe some mild facial asymmetry at times, with some flattening of the left nasal labial fold.  CT the head was done today in the emergency room by Dr. Ralene Bathe, and revealed postsurgical changes most consistent with retraction. No evidence of intracranial hemorrhage is noted.  Patient is sitting up, eating her dinner.  Past  Medical History:  Past Medical History  Diagnosis Date  . Vision abnormalities   . Headache(784.0)   . PCOS (polycystic ovarian syndrome)   . Fibroids   . Astigmatism   . Asthma     as child   . Pneumonia     3-4 yrs ago     Past Surgical History:  Past Surgical History  Procedure Laterality Date  . Wisdom tooth extraction  2006  . Myomectomy    . Craniotomy N/A 10/31/2014    Procedure: CRANIOTOMY FOR RESECTION OF TUMOR;  Surgeon: Jovita Gamma, MD;  Location: [facility];  Service: Neurosurgery;  Laterality: N/A;  Craniotomy for resection of tumor    Family History:  Family History  Problem Relation Age of Onset  . Hypertension Mother   . Diabetes Mother   . Ovarian cancer    . Breast cancer    . Fibroids      Social History:  reports that she has never smoked. She has never used smokeless tobacco. She reports that she drinks alcohol. She reports that she does not use illicit drugs.  Allergies: No Known Allergies  Medications: I have reviewed the patient's current medications.  ROS:  Notable for those difficulties described in her history of present illness and past history, but is otherwise unremarkable.  Physical Examination: Well-developed well-nourished black female in no acute distress. Blood pressure 110/62, pulse 124, temperature 97.4 F (36.3 C), temperature source Axillary, resp. rate 26, last menstrual period 10/02/2014, SpO2 96 %.  Wound: Healing nicely; no erythema, swelling, or drainage.  Neurological Examination: Mental Status Examination:  Awake and alert, oriented. Following commands. Speech fluent. Cranial Nerve  Examination:  Pupils, equal round, reactive to light. EOMI. Some flattening of the left nasolabial fold at times, otherwise at times symmetrical. Hearing present bilaterally. Tongue midline. Motor Examination:  5/5 strength in the upper and lower extremities. No drift of the upper extremities. Sensory Examination:  Intact to pinprick  throughout. Reflex Examination:   Symmetrical. Gait and Stance Examination:  Not tested due to the nature the patient's condition.   Results for orders placed or performed during the hospital encounter of 11/23/14 (from the past 48 hour(s))  Comprehensive metabolic panel     Status: Abnormal   Collection Time: 11/23/14  1:39 PM  Result Value Ref Range   Sodium 142 135 - 145 mmol/L   Potassium 3.8 3.5 - 5.1 mmol/L   Chloride 108 101 - 111 mmol/L   CO2 18 (L) 22 - 32 mmol/L   Glucose, Bld 149 (H) 65 - 99 mg/dL   BUN 10 6 - 20 mg/dL   Creatinine, Ser 1.38 (H) 0.44 - 1.00 mg/dL   Calcium 8.9 8.9 - 10.3 mg/dL   Total Protein 7.3 6.5 - 8.1 g/dL   Albumin 2.6 (L) 3.5 - 5.0 g/dL   AST 122 (H) 15 - 41 U/L   ALT 79 (H) 14 - 54 U/L   Alkaline Phosphatase 88 38 - 126 U/L   Total Bilirubin 0.7 0.3 - 1.2 mg/dL   GFR calc non Af Amer 49 (L) >60 mL/min   GFR calc Af Amer 57 (L) >60 mL/min    Comment: (NOTE) The eGFR has been calculated using the CKD EPI equation. This calculation has not been validated in all clinical situations. eGFR's persistently <60 mL/min signify possible Chronic Kidney Disease.    Anion gap 16 (H) 5 - 15  CBC with Differential     Status: Abnormal   Collection Time: 11/23/14  1:39 PM  Result Value Ref Range   WBC 12.0 (H) 4.0 - 10.5 K/uL   RBC 4.66 3.87 - 5.11 MIL/uL   Hemoglobin 13.1 12.0 - 15.0 g/dL   HCT 40.9 36.0 - 46.0 %   MCV 87.8 78.0 - 100.0 fL   MCH 28.1 26.0 - 34.0 pg   MCHC 32.0 30.0 - 36.0 g/dL   RDW 19.2 (H) 11.5 - 15.5 %   Platelets 298 150 - 400 K/uL   Neutrophils Relative % 53 43 - 77 %   Neutro Abs 6.3 1.7 - 7.7 K/uL   Lymphocytes Relative 42 12 - 46 %   Lymphs Abs 5.0 (H) 0.7 - 4.0 K/uL   Monocytes Relative 3 3 - 12 %   Monocytes Absolute 0.4 0.1 - 1.0 K/uL   Eosinophils Relative 2 0 - 5 %   Eosinophils Absolute 0.3 0.0 - 0.7 K/uL   Basophils Relative 0 0 - 1 %   Basophils Absolute 0.0 0.0 - 0.1 K/uL  Cortisol     Status: None    Collection Time: 11/23/14  1:39 PM  Result Value Ref Range   Cortisol, Plasma >100.0 ug/dL    Comment: RESULTS CONFIRMED BY MANUAL DILUTION (NOTE) AM    6.7 - 22.6 ug/dL PM   <10.0       ug/dL   I-Stat beta hCG blood, ED     Status: None   Collection Time: 11/23/14  1:44 PM  Result Value Ref Range   I-stat hCG, quantitative <5.0 <5 mIU/mL   Comment 3            Comment:   GEST.  AGE      CONC.  (mIU/mL)   <=1 WEEK        5 - 50     2 WEEKS       50 - 500     3 WEEKS       100 - 10,000     4 WEEKS     1,000 - 30,000        FEMALE AND NON-PREGNANT FEMALE:     LESS THAN 5 mIU/mL   CBG monitoring, ED     Status: Abnormal   Collection Time: 11/23/14  1:44 PM  Result Value Ref Range   Glucose-Capillary 138 (H) 65 - 99 mg/dL  I-stat troponin, ED     Status: None   Collection Time: 11/23/14  1:46 PM  Result Value Ref Range   Troponin i, poc 0.03 0.00 - 0.08 ng/mL   Comment 3            Comment: Due to the release kinetics of cTnI, a negative result within the first hours of the onset of symptoms does not rule out myocardial infarction with certainty. If myocardial infarction is still suspected, repeat the test at appropriate intervals.   I-stat Chem 8, ED     Status: Abnormal   Collection Time: 11/23/14  1:47 PM  Result Value Ref Range   Sodium 142 135 - 145 mmol/L   Potassium 3.7 3.5 - 5.1 mmol/L   Chloride 110 101 - 111 mmol/L   BUN 11 6 - 20 mg/dL   Creatinine, Ser 1.30 (H) 0.44 - 1.00 mg/dL   Glucose, Bld 145 (H) 65 - 99 mg/dL   Calcium, Ion 1.11 (L) 1.12 - 1.23 mmol/L   TCO2 17 0 - 100 mmol/L   Hemoglobin 15.3 (H) 12.0 - 15.0 g/dL   HCT 45.0 36.0 - 46.0 %  I-Stat CG4 Lactic Acid, ED     Status: Abnormal   Collection Time: 11/23/14  1:47 PM  Result Value Ref Range   Lactic Acid, Venous 5.83 (HH) 0.5 - 2.0 mmol/L   Comment NOTIFIED PHYSICIAN   I-Stat arterial blood gas, ED     Status: Abnormal   Collection Time: 11/23/14  1:48 PM  Result Value Ref Range   pH,  Arterial 7.440 7.350 - 7.450   pCO2 arterial 23.2 (L) 35.0 - 45.0 mmHg   pO2, Arterial 70.0 (L) 80.0 - 100.0 mmHg   Bicarbonate 15.7 (L) 20.0 - 24.0 mEq/L   TCO2 16 0 - 100 mmol/L   O2 Saturation 95.0 %   Acid-base deficit 7.0 (H) 0.0 - 2.0 mmol/L   Collection site RADIAL, ALLEN'S TEST ACCEPTABLE    Drawn by Operator    Sample type ARTERIAL   Troponin I     Status: Abnormal   Collection Time: 11/23/14  3:38 PM  Result Value Ref Range   Troponin I 0.84 (HH) <0.031 ng/mL    Comment:        POSSIBLE MYOCARDIAL ISCHEMIA. SERIAL TESTING RECOMMENDED. REPEATED TO VERIFY CRITICAL RESULT CALLED TO, READ BACK BY AND VERIFIED WITH: S HARVEY,RN [id number] 11/23/2014 WBOND   Lactic acid, plasma     Status: Abnormal   Collection Time: 11/23/14  3:38 PM  Result Value Ref Range   Lactic Acid, Venous 4.6 (HH) 0.5 - 2.0 mmol/L    Comment: REPEATED TO VERIFY CRITICAL RESULT CALLED TO, READ BACK BY AND VERIFIED WITH: S HARVEY,RN 1631 11/23/2014 WBOND   Procalcitonin     Status: None  Collection Time: 11/23/14  3:38 PM  Result Value Ref Range   Procalcitonin <0.10 ng/mL    Comment:        Interpretation: PCT (Procalcitonin) <= 0.5 ng/mL: Systemic infection (sepsis) is not likely. Local bacterial infection is possible. (NOTE)         ICU PCT Algorithm               Non ICU PCT Algorithm    ----------------------------     ------------------------------         PCT < 0.25 ng/mL                 PCT < 0.1 ng/mL     Stopping of antibiotics            Stopping of antibiotics       strongly encouraged.               strongly encouraged.    ----------------------------     ------------------------------       PCT level decrease by               PCT < 0.25 ng/mL       >= 80% from peak PCT       OR PCT 0.25 - 0.5 ng/mL          Stopping of antibiotics                                             encouraged.     Stopping of antibiotics           encouraged.    ----------------------------      ------------------------------       PCT level decrease by              PCT >= 0.25 ng/mL       < 80% from peak PCT        AND PCT >= 0.5 ng/mL            Continuin g antibiotics                                              encouraged.       Continuing antibiotics            encouraged.    ----------------------------     ------------------------------     PCT level increase compared          PCT > 0.5 ng/mL         with peak PCT AND          PCT >= 0.5 ng/mL             Escalation of antibiotics                                          strongly encouraged.      Escalation of antibiotics        strongly encouraged.   Glucose, capillary     Status: None   Collection Time: 11/23/14  4:21 PM  Result Value Ref Range   Glucose-Capillary 71 65 - 99 mg/dL    Ct  Head Wo Contrast  11/23/2014   CLINICAL DATA:  Brain tumor resection 10/31/2014.  Syncopal episode.  EXAM: CT HEAD WITHOUT CONTRAST  TECHNIQUE: Contiguous axial images were obtained from the base of the skull through the vertex without intravenous contrast.  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 There is no identifiable residual tumor.  The brainstem and cerebellum are normal. There is low density in the right insular region most consistent with postoperative infarction. The area of involvement measures approximately 4.5 cm. No evidence of hematoma. No extra-axial fluid collection. No hydrocephalus. No fluid in the sinuses.  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 4.5 cm region of low density centered in the insula on the right consistent with postoperative infarction. No hemorrhage, hydrocephalus or extra-axial collection.   Electronically Signed   By: Nelson Chimes M.D.   On: 11/23/2014 15:03   Ct Angio Chest Pe W/cm &/or Wo Cm  11/23/2014   CLINICAL DATA:  Syncope, loss of  consciousness.  EXAM: CT ANGIOGRAPHY CHEST WITH CONTRAST  TECHNIQUE: Multidetector CT imaging of the chest was performed using the standard protocol during bolus administration of intravenous contrast. Multiplanar CT image reconstructions and MIPs were obtained to evaluate the vascular anatomy.  CONTRAST:  164mL OMNIPAQUE IOHEXOL 350 MG/ML SOLN  COMPARISON:  Chest radiograph of same day.  FINDINGS: No pneumothorax or pleural effusion is noted. No acute pulmonary disease is noted. There is no evidence of thoracic aortic aneurysm. Large bilateral pulmonary emboli are noted with linear embolus at the bifurcation of the main pulmonary artery consistent with saddle embolus. RV/LV ratio of greater than 2 is noted. No mediastinal mass or adenopathy is noted. Visualized portion of upper abdomen appears normal. No significant osseous abnormality is noted.  Review of the MIP images confirms the above findings.  IMPRESSION: Large bilateral central pulmonary emboli are noted. Positive for acute PE with CT evidence of right heart strain (RV/LV Ratio = 2.1) consistent with at least submassive (intermediate risk) PE. The presence of right heart strain has been associated with an increased risk of morbidity and mortality. Please activate Code PE by paging [phone]. Critical Value/emergent results were called by telephone at the time of interpretation on 11/23/2014 at 3:06 pm to Dr. Quintella Reichert, who verbally acknowledged these results.   Electronically Signed   By: Marijo Conception, M.D.   On: 11/23/2014 15:07   Dg Chest Port 1 View  11/23/2014   CLINICAL DATA:  Fevers and loss of consciousness  EXAM: PORTABLE CHEST - 1 VIEW  COMPARISON:  05/19/2012  FINDINGS: Cardiac shadow is stable but mildly enlarged. The lungs are well aerated bilaterally. No focal infiltrate or sizable effusion is seen.  IMPRESSION: No acute abnormality noted.   Electronically Signed   By: Inez Catalina M.D.   On: 11/23/2014 13:53      Assessment/Plan: Patient admitted for acute pulmonary embolism, that presented with syncope, hypotension, tachycardia, and diaphoresis.  23 days status post craniotomy for gross total resection of craniopharyngioma. Has had diabetes insipidus since surgery, just started on DDAVP 0.05 mg by mouth twice a day this week. Neurologically stable.  Favor continuing DDAVP by mouth. Spoke with Dr. Susa [facility]) re: DDAVP, and he has reordered it. Will need to follow-up with me in the office as scheduled as well as with Dr. Buddy Duty (for endocrinologic follow-up).  Appreciate CCM management of acute pulmonary embolism with associated hemodynamic changes.  To be followed from a neurosurgical perspective by Dr. Earle Gell this weekend, and I will see the patient again on June 20.  Hosie Spangle, MD 11/23/2014, 6:30 PM

## 2014-11-23 NOTE — Progress Notes (Signed)
[facility] Progress Note Patient Name: SHALIN LINDERS DOB: 02-11-1981 MRN: [id number]   Date of Service  11/23/2014  HPI/Events of Note  Hx of DI s/p suprasellar tumor resection in late May 2016. Asked by neurosurgeon to start home DDAVP 0.05 mg PO BID.  eICU Interventions  Will order DDAVP PO.     Intervention Category Intermediate Interventions: Other:  Lysle Dingwall 11/23/2014, 6:27 PM

## 2014-11-23 NOTE — Progress Notes (Signed)
Informed Dr. Wallis Bamberg that the patient currently had her Nuva ring birth control in. MD agreed that it should be removed.

## 2014-11-23 NOTE — ED Notes (Signed)
Critical Care at bedside.  

## 2014-11-23 NOTE — Progress Notes (Signed)
Notified Dr. Wallis Bamberg that the ECHO tech reported there could potentially be a clot in the patient's pulmonary artery. Also updated on critical labs Troponin 0.84 and lactic 4.6

## 2014-11-23 NOTE — H&P (Signed)
PULMONARY / CRITICAL CARE MEDICINE   Name: Amanda Murphy MRN: [id number] DOB: 1980/07/08    ADMISSION DATE:  11/23/2014 CONSULTATION DATE:  11/23/14  REFERRING MD :  Dr. Ralene Bathe / EDP    CHIEF COMPLAINT:  Syncopal episode, tachycardia, hypotension    INITIAL PRESENTATION: 34 y/o F with recent hx (5/25-5/31) of a R suprasellar cyst tumor resection complicated by DI who presented to [facility] on 6/17 after a syncopal episode.  Found to be hypotensive with lactic acidosis and PCCM consulted for ICU admit.     STUDIES: 6/17  CTA of Chest >>   SIGNIFICANT EVENTS: 5/25 - 5/31  Admit for R suprasellar cyst tumor, hemianopsia.  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  Found to be hypotensive with lactic acidosis and PCCM consulted for ICU admit.    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  She received several doses of DDAVP during admission.  Na range was 141-145.  She was treated empirically for seizures with Keppra and has been compliant with mediations.  Prior to hospitalization, she had taken a trip to [place] and developed LLE posterior calf pain.    The patient reports excessive thirst since discharge and was planned to follow up with Dr. Wylene Simmer 6/17.  She went to her scheduled appointment and the office and been re-located.  She then found the second location but at that time felt her heart racing and she was very short of breath.  She remembers reaching for the wall but then passed out.  Her mother indicates she was altered for approximately 5 minutes but it took 10 for her to be completely clear.  ER evaluation noted her to be pale, diaphoretic, tachycardic  and a CBG of 91.  She also had an EKG with diffuse ST depression.  Initially, she was documented to be lethargic but responsive.  The patient was treated with volume resuscitation and stress steroids.  ABG 7.440 / 23 / 70 / 15.7, Na 142, K 3.7, sr cr 1.38 (up from 0.73), troponin 0.03, glucose of 145 and lactate 5.83.  The patients mental status improved in ER as well as BP responded to volume.  PCCM consulted for evaluation.     PAST MEDICAL HISTORY :   has a past medical history of Vision abnormalities; Headache(784.0); PCOS (polycystic ovarian syndrome); Fibroids; Astigmatism; Asthma; and Pneumonia.  has past surgical history that includes Wisdom tooth extraction (2006); Myomectomy; and Craniotomy (N/A, 10/31/2014).   Prior to Admission medications   Medication Sig Start Date End Date Taking? Authorizing Provider  acetaminophen (TYLENOL) 500 MG tablet Take 1,000 mg by mouth every 6 (six) hours as needed for mild pain.    Historical Provider, MD  ALPRAZolam Duanne Moron) 0.5 MG tablet Take 1 tablet (0.5 mg total) by mouth at bedtime as needed for anxiety. 10/19/14   Olga Millers, MD  Cyanocobalamin (VITAMIN B 12 PO) Take 1 tablet by mouth daily.    Historical Provider, MD  docusate sodium (COLACE) 100 MG capsule Take 100 mg by mouth 2 (two) times daily as needed for mild constipation.    Historical Provider, MD  etonogestrel-ethinyl estradiol (NUVARING) 0.12-0.015 MG/24HR vaginal ring  Place 1 each vaginally every 28 (twenty-eight) days. Insert vaginally and leave in place for 3 consecutive weeks, then remove for 1 week.    Historical Provider, MD  fluticasone (FLONASE) 50 MCG/ACT nasal spray Place 2 sprays into both nostrils daily. Patient taking differently: Place 2 sprays into both nostrils daily as needed for allergies or rhinitis.  08/16/14   Olga Millers, MD  levETIRAcetam (KEPPRA XR) 500 MG 24 hr tablet Take 1 tablet (500 mg total) by mouth 2 (two) times daily. 1 tablet twice a day for 3  weeks, then 1 tablet daily for 2 weeks, then discontinue 11/06/14   Jovita Gamma, MD  levocetirizine (XYZAL) 5 MG tablet Take 1 tablet (5 mg total) by mouth every evening. Patient taking differently: Take 5 mg by mouth daily as needed for allergies.  08/16/14   Olga Millers, MD  Multiple Vitamin (MULTIVITAMIN WITH MINERALS) TABS tablet Take 1 tablet by mouth daily.    Historical Provider, MD  naproxen sodium (ANAPROX) 220 MG tablet Take 440 mg by mouth daily as needed (pain, headache).     Historical Provider, MD  oxyCODONE-acetaminophen (PERCOCET/ROXICET) 5-325 MG per tablet Take 1-2 tablets by mouth every 4 (four) hours as needed for moderate pain. 11/06/14   Jovita Gamma, MD   No Known Allergies  FAMILY HISTORY:  indicated that her mother is alive. She indicated that her father is alive.    SOCIAL HISTORY:  reports that she has never smoked. She has never used smokeless tobacco. She reports that she drinks alcohol. She reports that she does not use illicit drugs.  REVIEW OF SYSTEMS:   Gen: Denies fever, chills, weight change, fatigue, night sweats HEENT: Denies blurred vision, double vision, hearing loss, tinnitus, sinus congestion, rhinorrhea, sore throat, neck stiffness, dysphagia PULM: Denies cough, sputum production, hemoptysis, wheezing.  She reports chest pain prior to passing out and shortness of breath CV: Denies edema, orthopnea, paroxysmal nocturnal dyspnea.  Reports palpitations GI: Denies abdominal pain, nausea, vomiting, diarrhea, hematochezia, melena, constipation, change in bowel habits GU: Denies dysuria, hematuria, polyuria, oliguria, urethral discharge Endocrine: Denies hot or cold intolerance, polyuria, polyphagia or appetite change.  Reports polydipsia Derm: Denies rash, dry skin, scaling or peeling skin change Heme: Denies easy bruising, bleeding, bleeding gums Neuro: Denies headache, numbness, weakness, slurred speech, loss of memory or consciousness     SUBJECTIVE:   VITAL SIGNS: Temp:  [100.1 F (37.8 C)] 100.1 F (37.8 C) (06/17 1343) Pulse Rate:  [134-142] 134 (06/17 1343) Resp:  [26-28] 28 (06/17 1343) BP: (86-95)/(64-70) 95/70 mmHg (06/17 1343) SpO2:  [96 %] 96 % (06/17 1343)   HEMODYNAMICS:     VENTILATOR SETTINGS:     INTAKE / OUTPUT: No intake or output data in the 24 hours ending 11/23/14 1351  PHYSICAL EXAMINATION: General:  Obese female in NAD Neuro:  AAOx4, speech clear, MAE HEENT:  MM pink/moist, no jvd Cardiovascular:  s1s2 rrr, tachy Lungs:  resp's even/non-labored, lungs bilaterally clear  Abdomen:  Obese/soft, NTND Musculoskeletal:  No acute deformities, Homans + Skin:  Warm/dry, no LE edema, LE's symmetrical   LABS:  CBC  Recent Labs Lab 11/23/14 1347  HGB 15.3*  HCT 45.0   Coag's No results for input(s): APTT, INR in the last 168 hours.   BMET  Recent Labs Lab 11/23/14 1347  NA 142  K 3.7  CL 110  BUN 11  CREATININE 1.30*  GLUCOSE 145*   Electrolytes No results for input(s): CALCIUM, MG, PHOS in  the last 168 hours.   Sepsis Markers No results for input(s): LATICACIDVEN, PROCALCITON, O2SATVEN in the last 168 hours.   ABG  Recent Labs Lab 11/23/14 1348  PHART 7.440  PCO2ART 23.2*  PO2ART 70.0*   Liver Enzymes No results for input(s): AST, ALT, ALKPHOS, BILITOT, ALBUMIN in the last 168 hours.   Cardiac Enzymes No results for input(s): TROPONINI, PROBNP in the last 168 hours.   Glucose No results for input(s): GLUCAP in the last 168 hours.  Imaging No results found.   ASSESSMENT / PLAN:  PULMONARY OETT A: Dyspnea - rule out PE P:   Assess CTA chest now  Cleared with Dr. Rita Ohara for anticoagulation if PE/DVT positive  Oxygen as needed for sats > 92%  CARDIOVASCULAR CVL A:  Tachycardia - suspect in setting of volume depletion Hypotension - resolving with NS  Syncopal Episode  P:  D5 LR @ 125 ml/hr S/P NS bolus x2 Assess cortisol now  S/P 100  mg solu-cortef in ER, await cortisol before ordering further Trend troponin  Repeat EKG in am  Levophed if needed for MAP > 65 Assess STAT ECHO   RENAL A:   Lactic Acidosis  AKI P:   Trend lactate Monitor BMP / UOP   GASTROINTESTINAL A:   Protein Calorie Malnutrition - albumin 2.6 on admit Morbid Obesity  P:   Clear liquid diet, advance as tolerated   HEMATOLOGIC A:   No acute issues  P:  SCD's for now Will decide on heparin SQ vs full dose anticoagulation once CTA returned   INFECTIOUS A:   R/O Sepsis / Infectious etiology  P:   BCx2 6/17 >>  UC 6/17 >>   Monitor fever curve / leukocytosis  Trend PCT   ENDOCRINE A:   Hyperglycemia   P:   Monitor glucose If treated with stress dose steroids, will need to add SSI   NEUROLOGIC A:   Suprasellar Mass Resection (5/25) Syncopal Event  P:   RASS goal: 0 Minimize sedating medications  Continue Keppra  Dr. Rita Ohara notified of admission    FAMILY  - Updates: Patient and mother updated at bedside.   Noe Gens, NP-C [facility] Pgr: [phone] or [phone] 11/23/2014, 1:51 PM   PCCM ATTENDING: I have reviewed pt's initial presentation, consultants notes and hospital database in detail.  The above assessment and plan was formulated under my direction.  In summary: 22 F admitted approx 3 wks after surgical removal of a suprasellar tumor with syncope. We helped direct eval in ED which included CTA chest. This has demonstrated large bilateral PEs. Given recent neurosurgery, she is not a candidate for systemic thrombolysis. As she presented with syncope, tachycardia and hypotension, she will be admitted to ICU for close monitoring. Further assessment will include TTE and venous Dopplers.     Merton Border, MD;  PCCM service; Mobile [phone]

## 2014-11-23 NOTE — ED Notes (Signed)
Pt is alert and oriented. Skin is dry. Great improvement from initial arrival.

## 2014-11-23 NOTE — ED Notes (Signed)
Per EMS- Pt comes from doctor's office where she was there for follow up after brain tumor removal 5/25. When she got off the elevator she had syncopal episode, was pale, diaphoretic. EMS unable to get BP, HR 130 ST with diffuse ST depression, CBG 91

## 2014-11-23 NOTE — ED Notes (Signed)
Dr. Ralene Bathe at bedside, using ultrasound to assess patient. Pt is talking with her mother who is at bedside.

## 2014-11-23 NOTE — Progress Notes (Signed)
  Echocardiogram 2D Echocardiogram has been performed.  Amanda Murphy 11/23/2014, 5:14 PM

## 2014-11-23 NOTE — ED Provider Notes (Signed)
CSN: [id number]     Arrival date & time 11/23/14  1321 History   First MD Initiated Contact with Patient 11/23/14 1326     Chief Complaint  Patient presents with  . Loss of Consciousness     The history is provided by the patient, the EMS personnel and a parent. No language interpreter was used.   Amanda Murphy presents for evaluation of syncopal event. She was going to get a lab draw today for a follow-up after getting surgery for a brain tumor. She was walking down the hall and syncopized. For EMS she was "allergic, diaphoretic, they were unable to obtain blood pressure pulse ox. She complains of chest pain, shortness of breath, back pain. She was seen a week ago for calf pain and had an ultrasound of her leg that was negative for clots. She per family she has had increased thirst lately and has recently been diagnosed with diabetes insipidus.  Past Medical History  Diagnosis Date  . Vision abnormalities   . Headache(784.0)   . PCOS (polycystic ovarian syndrome)   . Fibroids   . Astigmatism   . Asthma     as child   . Pneumonia     3-4 yrs ago    Past Surgical History  Procedure Laterality Date  . Wisdom tooth extraction  2006  . Myomectomy    . Craniotomy N/A 10/31/2014    Procedure: CRANIOTOMY FOR RESECTION OF TUMOR;  Surgeon: Jovita Gamma, MD;  Location: [place] [facility];  Service: Neurosurgery;  Laterality: N/A;  Craniotomy for resection of tumor   Family History  Problem Relation Age of Onset  . Hypertension Mother   . Diabetes Mother   . Ovarian cancer    . Breast cancer    . Fibroids     History  Substance Use Topics  . Smoking status: Never Smoker   . Smokeless tobacco: Never Used  . Alcohol Use: 0.0 oz/week    0 Standard drinks or equivalent per week     Comment: occasional   OB History    No data available     Review of Systems  All other systems reviewed and are negative.     Allergies  Review of patient's allergies indicates no known  allergies.  Home Medications   Prior to Admission medications   Medication Sig Start Date End Date Taking? Authorizing Provider  acetaminophen (TYLENOL) 500 MG tablet Take 1,000 mg by mouth every 6 (six) hours as needed for mild pain.   Yes Historical Provider, MD  ALPRAZolam Duanne Moron) 0.5 MG tablet Take 1 tablet (0.5 mg total) by mouth at bedtime as needed for anxiety. 10/19/14  Yes Olga Millers, MD  calcium carbonate (TUMS - DOSED IN MG ELEMENTAL CALCIUM) 500 MG chewable tablet Chew 1 tablet by mouth daily.   Yes Historical Provider, MD  Cyanocobalamin (VITAMIN B 12 PO) Take 1 tablet by mouth daily.   Yes Historical Provider, MD  docusate sodium (COLACE) 100 MG capsule Take 100 mg by mouth 2 (two) times daily as needed for mild constipation.   Yes Historical Provider, MD  etonogestrel-ethinyl estradiol (NUVARING) 0.12-0.015 MG/24HR vaginal ring Place 1 each vaginally every 28 (twenty-eight) days. Insert vaginally and leave in place for 3 consecutive weeks, then remove for 1 week.   Yes Historical Provider, MD  fluticasone (FLONASE) 50 MCG/ACT nasal spray Place 2 sprays into both nostrils daily. Patient taking differently: Place 2 sprays into both nostrils daily as needed for allergies or rhinitis.  08/16/14  Yes Olga Millers, MD  levETIRAcetam (KEPPRA XR) 500 MG 24 hr tablet Take 1 tablet (500 mg total) by mouth 2 (two) times daily. 1 tablet twice a day for 3 weeks, then 1 tablet daily for 2 weeks, then discontinue 11/06/14  Yes Jovita Gamma, MD  levocetirizine (XYZAL) 5 MG tablet Take 1 tablet (5 mg total) by mouth every evening. Patient taking differently: Take 5 mg by mouth daily as needed for allergies.  08/16/14  Yes Olga Millers, MD  Multiple Vitamin (MULTIVITAMIN WITH MINERALS) TABS tablet Take 1 tablet by mouth daily.   Yes Historical Provider, MD  naproxen sodium (ANAPROX) 220 MG tablet Take 440 mg by mouth daily as needed (pain, headache).    Yes Historical Provider, MD   oxyCODONE-acetaminophen (PERCOCET/ROXICET) 5-325 MG per tablet Take 1-2 tablets by mouth every 4 (four) hours as needed for moderate pain. 11/06/14  Yes Jovita Gamma, MD   BP 112/82 mmHg  Pulse 127  Temp(Src) 100.1 F (37.8 C) (Rectal)  Resp 27  SpO2 97%  LMP 10/02/2014 (Exact Date) Physical Exam  Constitutional: She appears well-developed and well-nourished. She appears distressed.  HENT:  Head: Normocephalic and atraumatic.  Healing surgical wound to the right parietal region  Eyes: Pupils are equal, round, and reactive to light.  Cardiovascular: Regular rhythm.   No murmur heard. Tachycardic  Pulmonary/Chest: Effort normal and breath sounds normal. No respiratory distress.  Abdominal: Soft. There is no tenderness. There is no rebound and no guarding.  Musculoskeletal: She exhibits no edema or tenderness.  Neurological:  Lethargic, generalized weakness  Skin: She is diaphoretic.  Cool Extremities  Psychiatric:  Anxious appearing  Nursing note and vitals reviewed.   ED Course  Procedures (including critical care time) CRITICAL CARE Performed by: Quintella Reichert   Total critical care time: 30 minutes  Critical care time was exclusive of separately billable procedures and treating other patients.  Critical care was necessary to treat or prevent imminent or life-threatening deterioration.  Critical care was time spent personally by me on the following activities: development of treatment plan with patient and/or surrogate as well as nursing, discussions with consultants, evaluation of patient's response to treatment, examination of patient, obtaining history from patient or surrogate, ordering and performing treatments and interventions, ordering and review of laboratory studies, ordering and review of radiographic studies, pulse oximetry and re-evaluation of patient's condition.  Labs Review Labs Reviewed  COMPREHENSIVE METABOLIC PANEL - Abnormal; Notable for the  following:    CO2 18 (*)    Glucose, Bld 149 (*)    Creatinine, Ser 1.38 (*)    Albumin 2.6 (*)    AST 122 (*)    ALT 79 (*)    GFR calc non Af Amer 49 (*)    GFR calc Af Amer 57 (*)    Anion gap 16 (*)    All other components within normal limits  CBC WITH DIFFERENTIAL/PLATELET - Abnormal; Notable for the following:    WBC 12.0 (*)    RDW 19.2 (*)    Lymphs Abs 5.0 (*)    All other components within normal limits  I-STAT CHEM 8, ED - Abnormal; Notable for the following:    Creatinine, Ser 1.30 (*)    Glucose, Bld 145 (*)    Calcium, Ion 1.11 (*)    Hemoglobin 15.3 (*)    All other components within normal limits  I-STAT CG4 LACTIC ACID, ED - Abnormal; Notable for the following:  Lactic Acid, Venous 5.83 (*)    All other components within normal limits  I-STAT ARTERIAL BLOOD GAS, ED - Abnormal; Notable for the following:    pCO2 arterial 23.2 (*)    pO2, Arterial 70.0 (*)    Bicarbonate 15.7 (*)    Acid-base deficit 7.0 (*)    All other components within normal limits  CBG MONITORING, ED - Abnormal; Notable for the following:    Glucose-Capillary 138 (*)    All other components within normal limits  CULTURE, BLOOD (ROUTINE X 2)  CULTURE, BLOOD (ROUTINE X 2)  URINE CULTURE  CORTISOL  BLOOD GAS, ARTERIAL  URINALYSIS, ROUTINE W REFLEX MICROSCOPIC (NOT AT [facility])  TROPONIN I  TROPONIN I  TROPONIN I  LACTIC ACID, PLASMA  PROCALCITONIN  I-STAT BETA HCG BLOOD, ED ([facility], [facility], [facility] ONLY)  I-STAT TROPOININ, ED    Imaging Review Ct Head Wo Contrast  11/23/2014   CLINICAL DATA:  Brain tumor resection 10/31/2014.  Syncopal episode.  EXAM: CT HEAD WITHOUT CONTRAST  TECHNIQUE: Contiguous axial images were obtained from the base of the skull through the vertex without intravenous contrast.  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 There is no identifiable residual tumor.   The brainstem and cerebellum are normal. There is low density in the right insular region most consistent with postoperative infarction. The area of involvement measures approximately 4.5 cm. No evidence of hematoma. No extra-axial fluid collection. No hydrocephalus. No fluid in the sinuses.  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 4.5 cm region of low density centered in the insula on the right consistent with postoperative infarction. No hemorrhage, hydrocephalus or extra-axial collection.   Electronically Signed   By: Nelson Chimes M.D.   On: 11/23/2014 15:03   Ct Angio Chest Pe W/cm &/or Wo Cm  11/23/2014   CLINICAL DATA:  Syncope, loss of consciousness.  EXAM: CT ANGIOGRAPHY CHEST WITH CONTRAST  TECHNIQUE: Multidetector CT imaging of the chest was performed using the standard protocol during bolus administration of intravenous contrast. Multiplanar CT image reconstructions and MIPs were obtained to evaluate the vascular anatomy.  CONTRAST:  157mL OMNIPAQUE IOHEXOL 350 MG/ML SOLN  COMPARISON:  Chest radiograph of same day.  FINDINGS: No pneumothorax or pleural effusion is noted. No acute pulmonary disease is noted. There is no evidence of thoracic aortic aneurysm. Large bilateral pulmonary emboli are noted with linear embolus at the bifurcation of the main pulmonary artery consistent with saddle embolus. RV/LV ratio of greater than 2 is noted. No mediastinal mass or adenopathy is noted. Visualized portion of upper abdomen appears normal. No significant osseous abnormality is noted.  Review of the MIP images confirms the above findings.  IMPRESSION: Large bilateral central pulmonary emboli are noted. Positive for acute PE with CT evidence of right heart strain (RV/LV Ratio = 2.1) consistent with at least submassive (intermediate risk) PE. The presence of right heart strain has been associated with an increased risk of morbidity and mortality. Please activate Code PE by  paging [phone]. Critical Value/emergent results were called by telephone at the time of interpretation on 11/23/2014 at 3:06 pm to Dr. Quintella Reichert, who verbally acknowledged these results.   Electronically Signed   By: Marijo Conception, M.D.   On: 11/23/2014 15:07   Dg Chest Port 1 View  11/23/2014   CLINICAL DATA:  Fevers and loss of consciousness  EXAM: PORTABLE CHEST - 1 VIEW  COMPARISON:  05/19/2012  FINDINGS: Cardiac shadow is stable but mildly enlarged. The lungs are well aerated bilaterally. No focal infiltrate or sizable effusion is seen.  IMPRESSION: No acute abnormality noted.   Electronically Signed   By: Inez Catalina M.D.   On: 11/23/2014 13:53     EKG Interpretation   Date/Time:  Friday November 23 2014 13:27:18 EDT Ventricular Rate:  141 PR Interval:  109 QRS Duration: 74 QT Interval:  296 QTC Calculation: 453 R Axis:   115 Text Interpretation:  Sinus tachycardia Right axis deviation Borderline  repolarization abnormality Confirmed by Hazle Coca [phone]) on 11/23/2014  2:05:46 PM      MDM   Final diagnoses:  Acute massive pulmonary embolism    Patient here for evaluation of syncopal event, postoperative from neurosurgery 3 weeks ago. Patient very ill appearing on initial ED arrival with pressures in the 80s repeat pressures in the 60s. Initial examination consistent with shock. EKG with evidence of heart strain. Patient was treated with hydrocortisone pending workup for possible adrenal insufficiency. Patient's blood pressure did improve with fluid administration and her mental status improved with IV fluids. Discussed with Dr. Sherwood Gambler with neurosurgery, he does not recommend TPA given her recent surgery. Discussed with Dr. Leonidas Romberg with critical care who evaluated the patient emergency department and will admit for further management. Code PE was not activated given her recent cranial surgery.    Quintella Reichert, MD 11/23/14 [phone]

## 2014-11-23 NOTE — Progress Notes (Signed)
ANTICOAGULATION CONSULT NOTE   Pharmacy Consult for heparin Indication: pulmonary embolus  No Known Allergies  Patient Measurements: IBW: 52.4 kg 110.7 kg Heparin Dosing Weight: 79 kg  Vital Signs: Temp: 97.6 F (36.4 C) (06/17 1932) Temp Source: Axillary (06/17 1932) BP: 118/75 mmHg (06/17 2100) Pulse Rate: 136 (06/17 2100)  Labs:  Recent Labs  11/23/14 1339 11/23/14 1347 11/23/14 1538 11/23/14 2122  HGB 13.1 15.3*  --   --   HCT 40.9 45.0  --   --   PLT 298  --   --   --   HEPARINUNFRC  --   --   --  0.35  CREATININE 1.38* 1.30*  --   --   TROPONINI  --   --  0.84*  --     CrCl cannot be calculated (Unknown ideal weight.).   Medical History: Past Medical History  Diagnosis Date  . Vision abnormalities   . Headache(784.0)   . PCOS (polycystic ovarian syndrome)   . Fibroids   . Astigmatism   . Asthma     as child   . Pneumonia     3-4 yrs ago     Medications:  Prescriptions prior to admission  Medication Sig Dispense Refill Last Dose  . acetaminophen (TYLENOL) 500 MG tablet Take 1,000 mg by mouth every 6 (six) hours as needed for mild pain.   11/23/2014 at 230  . ALPRAZolam (XANAX) 0.5 MG tablet Take 1 tablet (0.5 mg total) by mouth at bedtime as needed for anxiety. 20 tablet 0 11/22/2014 at Unknown time  . calcium carbonate (TUMS - DOSED IN MG ELEMENTAL CALCIUM) 500 MG chewable tablet Chew 1 tablet by mouth daily.   11/23/2014 at Unknown time  . Cyanocobalamin (VITAMIN B 12 PO) Take 1 tablet by mouth daily.   2-3 weeks  . desmopressin (DDAVP) 0.1 MG tablet Take 0.05 mg by mouth 2 (two) times daily.   not yet started  . docusate sodium (COLACE) 100 MG capsule Take 100 mg by mouth 2 (two) times daily as needed for mild constipation.   over 30 days  . etonogestrel-ethinyl estradiol (NUVARING) 0.12-0.015 MG/24HR vaginal ring Place 1 each vaginally every 28 (twenty-eight) days. Insert vaginally and leave in place for 3 consecutive weeks, then remove for 1 week.    unknown  . fluticasone (FLONASE) 50 MCG/ACT nasal spray Place 2 sprays into both nostrils daily. (Patient taking differently: Place 2 sprays into both nostrils daily as needed for allergies or rhinitis. ) 16 g 6 11/21/2014  . levETIRAcetam (KEPPRA XR) 500 MG 24 hr tablet Take 1 tablet (500 mg total) by mouth 2 (two) times daily. 1 tablet twice a day for 3 weeks, then 1 tablet daily for 2 weeks, then discontinue 56 tablet 0 11/23/2014 at Unknown time  . levocetirizine (XYZAL) 5 MG tablet Take 1 tablet (5 mg total) by mouth every evening. (Patient taking differently: Take 5 mg by mouth daily as needed for allergies. ) 30 tablet 3 march  . Multiple Vitamin (MULTIVITAMIN WITH MINERALS) TABS tablet Take 1 tablet by mouth daily.   11/23/2014 at Unknown time  . naproxen sodium (ANAPROX) 220 MG tablet Take 440 mg by mouth daily as needed (pain, headache).    11/22/2014 at Unknown time  . oxyCODONE-acetaminophen (PERCOCET/ROXICET) 5-325 MG per tablet Take 1-2 tablets by mouth every 4 (four) hours as needed for moderate pain. 75 tablet 0 11/22/2014 at Unknown time    Assessment: 34 yo female admitted with syncopal episode  and hypotension. 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 CTA demonstrated bilateral submassive PE with right heart strain. Pt is to begin a heparin gtt. CBC stable, eCrCl 60-70 ml/min.  Initial heparin level = 0.35 (low end of therapeutic range)  Goal of Therapy:  Heparin level 0.3-0.7 units/ml Monitor platelets by anticoagulation protocol: Yes   Plan:  Increase heparin to 1350 units / hr  Daily HL, CBC Check HL this evening  Monitor for s/sx bleeding  Thank you. Anette Guarneri, PharmD [phone]  11/23/2014 10:19 PM

## 2014-11-23 NOTE — Progress Notes (Signed)
ANTICOAGULATION CONSULT NOTE - Initial Consult  Pharmacy Consult for heparin Indication: pulmonary embolus  No Known Allergies  Patient Measurements: IBW: 52.4 kg 110.7 kg Heparin Dosing Weight: 79 kg  Vital Signs: Temp: 100.1 F (37.8 C) (06/17 1343) Temp Source: Rectal (06/17 1343) BP: 109/76 mmHg (06/17 1450) Pulse Rate: 128 (06/17 1450)  Labs:  Recent Labs  11/23/14 1339 11/23/14 1347  HGB 13.1 15.3*  HCT 40.9 45.0  PLT 298  --   CREATININE 1.38* 1.30*    CrCl cannot be calculated (Unknown ideal weight.).   Medical History: Past Medical History  Diagnosis Date  . Vision abnormalities   . Headache(784.0)   . PCOS (polycystic ovarian syndrome)   . Fibroids   . Astigmatism   . Asthma     as child   . Pneumonia     3-4 yrs ago     Medications:   (Not in a hospital admission)  Assessment: 34 yo female admitted with syncopal episode and hypotension. 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 CTA demonstrated bilateral submassive PE with right heart strain. Pt is to begin a heparin gtt. CBC stable, eCrCl 60-70 ml/min.  Goal of Therapy:  Heparin level 0.3-0.7 units/ml Monitor platelets by anticoagulation protocol: Yes   Plan:  Heparin bolus 4000 units x1 then 1250 units/hr Daily HL, CBC Check HL this evening  Monitor for s/sx bleeding  Hughes Better, PharmD, BCPS Clinical Pharmacist Pager: [phone] 11/23/2014 2:56 PM

## 2014-11-23 NOTE — ED Notes (Signed)
Patient transported to CT 

## 2014-11-23 NOTE — ED Notes (Signed)
Pt appears restless, is diaphoretic. Is alert, but is lethargic, follows commands. Denies pain but reports earlier she had CP.

## 2014-11-24 ENCOUNTER — Inpatient Hospital Stay (HOSPITAL_COMMUNITY): Payer: 59

## 2014-11-24 DIAGNOSIS — I2699 Other pulmonary embolism without acute cor pulmonale: Secondary | ICD-10-CM

## 2014-11-24 LAB — CBC
HCT: 32.9 % — ABNORMAL LOW (ref 36.0–46.0)
Hemoglobin: 10.4 g/dL — ABNORMAL LOW (ref 12.0–15.0)
MCH: 27.7 pg (ref 26.0–34.0)
MCHC: 31.6 g/dL (ref 30.0–36.0)
MCV: 87.5 fL (ref 78.0–100.0)
PLATELETS: 274 10*3/uL (ref 150–400)
RBC: 3.76 MIL/uL — ABNORMAL LOW (ref 3.87–5.11)
RDW: 19.5 % — AB (ref 11.5–15.5)
WBC: 10.7 10*3/uL — AB (ref 4.0–10.5)

## 2014-11-24 LAB — BASIC METABOLIC PANEL
Anion gap: 10 (ref 5–15)
BUN: 6 mg/dL (ref 6–20)
CO2: 22 mmol/L (ref 22–32)
Calcium: 8.1 mg/dL — ABNORMAL LOW (ref 8.9–10.3)
Chloride: 111 mmol/L (ref 101–111)
Creatinine, Ser: 0.88 mg/dL (ref 0.44–1.00)
GFR calc Af Amer: >60 mL/min (ref >60)
GFR calc non Af Amer: >60 mL/min (ref >60)
GLUCOSE: 113 mg/dL — AB (ref 65–99)
POTASSIUM: 3.5 mmol/L (ref 3.5–5.1)
Sodium: 143 mmol/L (ref 135–145)

## 2014-11-24 LAB — TROPONIN I: Troponin I: 0.66 ng/mL (ref <0.031)

## 2014-11-24 LAB — HEPARIN LEVEL (UNFRACTIONATED)
Heparin Unfractionated: 0.41 IU/mL (ref 0.30–0.70)
Heparin Unfractionated: 0.49 IU/mL (ref 0.30–0.70)

## 2014-11-24 MED ORDER — MORPHINE SULFATE 2 MG/ML IJ SOLN
2.0000 mg | INTRAMUSCULAR | Status: DC | PRN
  Administered 2014-11-24 – 2014-11-25 (×3): 2 mg via INTRAVENOUS
  Filled 2014-11-24: qty 1
  Filled 2014-11-24: qty 2

## 2014-11-24 MED ORDER — HYDROCORTISONE 0.5 % EX CREA
TOPICAL_CREAM | Freq: Three times a day (TID) | CUTANEOUS | Status: DC | PRN
  Filled 2014-11-24: qty 28.35

## 2014-11-24 MED ORDER — SENNOSIDES-DOCUSATE SODIUM 8.6-50 MG PO TABS
2.0000 | ORAL_TABLET | Freq: Every day | ORAL | Status: DC
  Administered 2014-11-24: 2 via ORAL
  Filled 2014-11-24 (×6): qty 2

## 2014-11-24 NOTE — Progress Notes (Signed)
[place] Progress Note Patient Name: Amanda Murphy DOB: [birth year] MRN: [id number]   Date of Service  11/24/2014  HPI/Events of Note  Call from nurse stating patient with second episode of SSCP, non-radiating.  Described as a chest pressure with not N/V or diaphoresis.  Given morphine with relief.  EKG shows t wave changes suggestive of inferior ischemia.  This appears different from a June 5th EKG.  Patient is already on heparin for sub-massive PE.  ECHO 6/17 shows preserved EF and normal wall motion of LV but mildly dilated RV with reduced systolic function.  Trop have been positive.  eICU Interventions  Plan: Continue with heparin PRN morphine for CP Cycle trop - if these continue to be elevated will consult cardiology     Intervention Category Intermediate Interventions: Pain - evaluation and management  DETERDING,ELIZABETH 11/24/2014, 11:32 PM

## 2014-11-24 NOTE — Progress Notes (Signed)
Pt removed nuvaring

## 2014-11-24 NOTE — Progress Notes (Signed)
Notified Brandi NP that the patient was positive for a non mobile DVT in the left leg.

## 2014-11-24 NOTE — Progress Notes (Signed)
[place] Progress Note Patient Name: Amanda Murphy DOB: 1980/12/05 MRN: [id number]   Date of Service  11/24/2014  HPI/Events of Note  Pt c/o severe pain  eICU Interventions  Morphine ordered      Intervention Category Intermediate Interventions: Pain - evaluation and management  Asencion Noble 11/24/2014, 6:36 PM

## 2014-11-24 NOTE — Progress Notes (Signed)
VASCULAR LAB PRELIMINARY  PRELIMINARY  PRELIMINARY  PRELIMINARY  Bilateral lower extremity venous duplex  completed.    Preliminary report:  Right:  No evidence of DVT, superficial thrombosis, or Baker's cyst.  Left: DVT noted in the popliteal vein, PTV, peroneal v, and a soleal vein.  No evidence of superficial thrombosis.  No Baker's cyst.   Caelum Federici, RVT 11/24/2014, 1:35 PM

## 2014-11-24 NOTE — Progress Notes (Addendum)
[place] for heparin Indication: pulmonary embolus  No Known Allergies  Patient Measurements: IBW: 52.4 kg 110.7 kg Heparin Dosing Weight: 79 kg  Vital Signs: Temp: 98.9 F (37.2 C) (06/18 0749) Temp Source: Oral (06/18 0749) BP: 98/59 mmHg (06/18 0800) Pulse Rate: 109 (06/18 0800)  Labs:  Recent Labs  11/23/14 1339 11/23/14 1347 11/23/14 1538 11/23/14 2122 11/24/14 0625  HGB 13.1 15.3*  --   --  10.4*  HCT 40.9 45.0  --   --  32.9*  PLT 298  --   --   --  274  HEPARINUNFRC  --   --   --  0.35 0.41  CREATININE 1.38* 1.30*  --   --  0.88  TROPONINI  --   --  0.84*  --  0.66*    Estimated Creatinine Clearance: 110.4 mL/min (by C-G formula based on Cr of 0.88).   Medical History: Past Medical History  Diagnosis Date  . Vision abnormalities   . Headache(784.0)   . PCOS (polycystic ovarian syndrome)   . Fibroids   . Astigmatism   . Asthma     as child   . Pneumonia     3-4 yrs ago     Medications:  Prescriptions prior to admission  Medication Sig Dispense Refill Last Dose  . acetaminophen (TYLENOL) 500 MG tablet Take 1,000 mg by mouth every 6 (six) hours as needed for mild pain.   11/23/2014 at 230  . ALPRAZolam (XANAX) 0.5 MG tablet Take 1 tablet (0.5 mg total) by mouth at bedtime as needed for anxiety. 20 tablet 0 11/22/2014 at Unknown time  . calcium carbonate (TUMS - DOSED IN MG ELEMENTAL CALCIUM) 500 MG chewable tablet Chew 1 tablet by mouth daily.   11/23/2014 at Unknown time  . Cyanocobalamin (VITAMIN B 12 PO) Take 1 tablet by mouth daily.   2-3 weeks  . desmopressin (DDAVP) 0.1 MG tablet Take 0.05 mg by mouth 2 (two) times daily.   not yet started  . docusate sodium (COLACE) 100 MG capsule Take 100 mg by mouth 2 (two) times daily as needed for mild constipation.   over 30 days  . etonogestrel-ethinyl estradiol (NUVARING) 0.12-0.015 MG/24HR vaginal ring Place 1 each vaginally every 28 (twenty-eight) days. Insert  vaginally and leave in place for 3 consecutive weeks, then remove for 1 week.   unknown  . fluticasone (FLONASE) 50 MCG/ACT nasal spray Place 2 sprays into both nostrils daily. (Patient taking differently: Place 2 sprays into both nostrils daily as needed for allergies or rhinitis. ) 16 g 6 11/21/2014  . levETIRAcetam (KEPPRA XR) 500 MG 24 hr tablet Take 1 tablet (500 mg total) by mouth 2 (two) times daily. 1 tablet twice a day for 3 weeks, then 1 tablet daily for 2 weeks, then discontinue 56 tablet 0 11/23/2014 at Unknown time  . levocetirizine (XYZAL) 5 MG tablet Take 1 tablet (5 mg total) by mouth every evening. (Patient taking differently: Take 5 mg by mouth daily as needed for allergies. ) 30 tablet 3 march  . Multiple Vitamin (MULTIVITAMIN WITH MINERALS) TABS tablet Take 1 tablet by mouth daily.   11/23/2014 at Unknown time  . naproxen sodium (ANAPROX) 220 MG tablet Take 440 mg by mouth daily as needed (pain, headache).    11/22/2014 at Unknown time  . oxyCODONE-acetaminophen (PERCOCET/ROXICET) 5-325 MG per tablet Take 1-2 tablets by mouth every 4 (four) hours as needed for moderate pain. 75 tablet 0 11/22/2014  at Unknown time    Assessment: 34 yo female admitted with syncopal episode and hypotension. 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 CTA demonstrated bilateral submassive PE with right heart strain. Pt is to begin a heparin gtt. CBC stable, eCrCl 60-70 ml/min.  Initial heparin level = 0.35 (low end of therapeutic range) AM HL remains therapeutic at 0.41 on heparin 1350 units/hr. No issues with infusion or bleeding noted. Hgb 10.4, Plt wnl.  Goal of Therapy:  Heparin level 0.3-0.7 units/ml Monitor platelets by anticoagulation protocol: Yes   Plan:  Continue heparin to 1350 units / hr  6h confirmatory HL Daily HL, CBC Monitor for s/sx bleeding  Andrey Cota. Diona Foley, PharmD Clinical Pharmacist Pager [phone]  11/24/2014 9:42 AM   ADDN: Confirmatory HL  remains therapeutic at 0.49 on heparin 1350 units/hr. No bleeding noted.  Continue heparin 1350 units/hr Daily HL/CBC  Andrey Cota. Diona Foley, PharmD Clinical Pharmacist Pager [phone]

## 2014-11-24 NOTE — Progress Notes (Signed)
PULMONARY / CRITICAL CARE MEDICINE   Name: Amanda Murphy MRN: [id number] DOB: 1980-06-23    ADMISSION DATE:  11/23/2014 CONSULTATION DATE:  11/23/14  REFERRING MD :  Dr. Ralene Bathe / EDP    CHIEF COMPLAINT:  Syncopal episode, tachycardia, hypotension    INITIAL PRESENTATION: 34 y/o F with recent hx (5/25-5/31) of a R suprasellar cyst tumor resection complicated by DI who presented to [facility] on 6/17 after a syncopal episode.  Found to be hypotensive with lactic acidosis and PCCM consulted for ICU admit.     STUDIES: 6/17  CTA of Chest >> large bilateral central PE, R heart strain with RV/LV ratio of 2.1 6/17  CT of Head >> R craniotomy for suprasellar craniopharyngioma resection, 4.5 cm region of low density in the insula on R c/w postoperative infarction  6/17  ECHO >> LV nml size, septal flattening c/w RV overload, LVEF 65-70%, mild RV dilation, PA peak 67    SIGNIFICANT EVENTS: 5/25 - 5/31  Admit for R suprasellar cyst tumor, hemianopsia.  Course complicated by DI 7/51  Admit with hypotension / syncope.  CTA positive for large bilateral PE.      SUBJECTIVE:  Pt reports ongoing frequent urination with excessive thirst, mild chest discomfort.  No bleeding. Heparin gtt infusing.   VITAL SIGNS: Temp:  [97.4 F (36.3 C)-100.1 F (37.8 C)] 98.9 F (37.2 C) (06/18 0749) Pulse Rate:  [109-142] 109 (06/18 0800) Resp:  [10-28] 28 (06/18 0800) BP: (86-128)/(46-86) 98/59 mmHg (06/18 0800) SpO2:  [95 %-100 %] 95 % (06/18 0800) Weight:  [251 lb 5.2 oz (114 kg)-258 lb 9.6 oz (117.3 kg)] 258 lb 9.6 oz (117.3 kg) (06/18 0546)   HEMODYNAMICS:     VENTILATOR SETTINGS:     INTAKE / OUTPUT:  Intake/Output Summary (Last 24 hours) at 11/24/14 0914 Last data filed at 11/24/14 0800  Gross per 24 hour  Intake 4584.49 ml  Output   2550 ml  Net 2034.49 ml    PHYSICAL EXAMINATION: General:  Obese female in NAD Neuro:  AAOx4, speech clear, MAE HEENT:  MM pink/moist, no  jvd Cardiovascular:  s1s2 rrr, tachy Lungs:  resp's even/non-labored, lungs bilaterally clear  Abdomen:  Obese/soft, NTND Musculoskeletal:  No acute deformities, Homans + Skin:  Warm/dry, no LE edema, LE's symmetrical   LABS:  CBC  Recent Labs Lab 11/23/14 1339 11/23/14 1347 11/24/14 0625  WBC 12.0*  --  10.7*  HGB 13.1 15.3* 10.4*  HCT 40.9 45.0 32.9*  PLT 298  --  274   Coag's No results for input(s): APTT, INR in the last 168 hours.   BMET  Recent Labs Lab 11/23/14 1339 11/23/14 1347 11/24/14 0625  NA 142 142 143  K 3.8 3.7 3.5  CL 108 110 111  CO2 18*  --  22  BUN 10 11 6   CREATININE 1.38* 1.30* 0.88  GLUCOSE 149* 145* 113*   Electrolytes  Recent Labs Lab 11/23/14 1339 11/24/14 0625  CALCIUM 8.9 8.1*     Sepsis Markers  Recent Labs Lab 11/23/14 1347 11/23/14 1538  LATICACIDVEN 5.83* 4.6*  PROCALCITON  --  <0.10     ABG  Recent Labs Lab 11/23/14 1348  PHART 7.440  PCO2ART 23.2*  PO2ART 70.0*   Liver Enzymes  Recent Labs Lab 11/23/14 1339  AST 122*  ALT 79*  ALKPHOS 88  BILITOT 0.7  ALBUMIN 2.6*     Cardiac Enzymes  Recent Labs Lab 11/23/14 1538 11/24/14 0625  TROPONINI 0.84*  0.66*     Glucose  Recent Labs Lab 11/23/14 1344 11/23/14 1621  GLUCAP 138* 71    Imaging Ct Head Wo Contrast  11/23/2014   CLINICAL DATA:  Brain tumor resection 10/31/2014.  Syncopal episode.  EXAM: CT HEAD WITHOUT CONTRAST  TECHNIQUE: Contiguous axial images were obtained from the base of the skull through the vertex without intravenous contrast.  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 There is no identifiable residual tumor.  The brainstem and cerebellum are normal. There is low density in the right insular region most consistent with postoperative infarction. The area of involvement measures approximately 4.5 cm. No evidence of  hematoma. No extra-axial fluid collection. No hydrocephalus. No fluid in the sinuses.  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 4.5 cm region of low density centered in the insula on the right consistent with postoperative infarction. No hemorrhage, hydrocephalus or extra-axial collection.   Electronically Signed   By: Nelson Chimes M.D.   On: 11/23/2014 15:03   Ct Angio Chest Pe W/cm &/or Wo Cm  11/23/2014   CLINICAL DATA:  Syncope, loss of consciousness.  EXAM: CT ANGIOGRAPHY CHEST WITH CONTRAST  TECHNIQUE: Multidetector CT imaging of the chest was performed using the standard protocol during bolus administration of intravenous contrast. Multiplanar CT image reconstructions and MIPs were obtained to evaluate the vascular anatomy.  CONTRAST:  184mL OMNIPAQUE IOHEXOL 350 MG/ML SOLN  COMPARISON:  Chest radiograph of same day.  FINDINGS: No pneumothorax or pleural effusion is noted. No acute pulmonary disease is noted. There is no evidence of thoracic aortic aneurysm. Large bilateral pulmonary emboli are noted with linear embolus at the bifurcation of the main pulmonary artery consistent with saddle embolus. RV/LV ratio of greater than 2 is noted. No mediastinal mass or adenopathy is noted. Visualized portion of upper abdomen appears normal. No significant osseous abnormality is noted.  Review of the MIP images confirms the above findings.  IMPRESSION: Large bilateral central pulmonary emboli are noted. Positive for acute PE with CT evidence of right heart strain (RV/LV Ratio = 2.1) consistent with at least submassive (intermediate risk) PE. The presence of right heart strain has been associated with an increased risk of morbidity and mortality. Please activate Code PE by paging [phone]. Critical Value/emergent results were called by telephone at the time of interpretation on 11/23/2014 at 3:06 pm to Dr. Quintella Reichert, who verbally acknowledged these results.    Electronically Signed   By: Marijo Conception, M.D.   On: 11/23/2014 15:07   Dg Chest Port 1 View  11/23/2014   CLINICAL DATA:  Fevers and loss of consciousness  EXAM: PORTABLE CHEST - 1 VIEW  COMPARISON:  05/19/2012  FINDINGS: Cardiac shadow is stable but mildly enlarged. The lungs are well aerated bilaterally. No focal infiltrate or sizable effusion is seen.  IMPRESSION: No acute abnormality noted.   Electronically Signed   By: Inez Catalina M.D.   On: 11/23/2014 13:53     ASSESSMENT / PLAN:  PULMONARY OETT A: Dyspnea - in setting of large bilateral PE Large Bilateral PE  PAH / RV Strain on ECHO  P:   Assess CTA chest now  Cleared with Dr. Rita Ohara for anticoagulation if PE/DVT positive  Oxygen as needed for sats > 92% NOT a candidate for thrombolysis given recent NSGY Will likely need minimum of 6 mo to 1 year therapy  Pt would like to use  Xarelto (for once daily dosing) Will need to discuss contraceptive options with GYN prior to discharge  CARDIOVASCULAR CVL A:  Tachycardia - suspect in setting of volume depletion Hypotension - resolving with NS  Syncopal Episode - in setting of PE.  ECHO with mild RV dilation, systolic fxn mod-severely reduced, PA pressure 67 P:  D5 LR @ 50 ml/hr Trend troponin  Repeat EKG in am   RENAL A:   Lactic Acidosis  AKI P:   Trend lactate Monitor BMP / UOP   GASTROINTESTINAL A:   Protein Calorie Malnutrition - albumin 2.6 on admit Morbid Obesity  P:   Diet as tolerated   HEMATOLOGIC A:   Large Bilateral PE  P:  SCD's for now Heparin gtt for PE Assess LE dopplers to ensure no mobile clot  INFECTIOUS A:   R/O Sepsis / Infectious etiology  P:   BCx2 6/17 >>  UC 6/17 >>   Monitor fever curve / leukocytosis  Trend PCT   ENDOCRINE A:   Hyperglycemia   P:   Monitor glucose If treated with stress dose steroids, will need to add SSI   NEUROLOGIC A:   Suprasellar Mass Resection (5/25) Syncopal Event - in setting of large  bilateral PE Diabetes Insipidus P:   RASS goal: 0 Minimize sedating medications  Continue Keppra  Dr. Rita Ohara notified of admission, cleared for anticoagulation  DDAVP 0.05 mg BID    FAMILY  - Updates: Patient and significant other updated at bedside.   GLOBAL:  Pt to remain in ICU on heparin gtt.  Consider starting oral anticoagulation in am 6/19.     Noe Gens, NP-C [facility] Pgr: [phone] or [phone] 11/24/2014, 9:14 AM

## 2014-11-24 NOTE — Progress Notes (Signed)
Patient ID: Amanda Murphy, female   DOB: 22-Jan-1981, 34 y.o.   MRN: [id number] Subjective:  The patient is alert and pleasant. She feels "much better than yesterday".  Objective: Vital signs in last 24 hours: Temp:  [97.4 F (36.3 C)-100.1 F (37.8 C)] 98.9 F (37.2 C) (06/18 0749) Pulse Rate:  [109-142] 109 (06/18 0800) Resp:  [10-28] 28 (06/18 0800) BP: (86-128)/(46-86) 98/59 mmHg (06/18 0800) SpO2:  [95 %-100 %] 95 % (06/18 0800) Weight:  [114 kg (251 lb 5.2 oz)-117.3 kg (258 lb 9.6 oz)] 117.3 kg (258 lb 9.6 oz) (06/18 0546)  Intake/Output from previous day: 06/17 0701 - 06/18 0700 In: 4446 [P.O.:2400; I.V.:2046] Out: 2100 [Urine:2100] Intake/Output this shift: Total I/O In: 138.5 [I.V.:138.5] Out: 450 [Urine:450]  Physical exam the patient is alert and oriented 3. She is moving all 4 extremities well.  Lab Results:  Recent Labs  11/23/14 1339 11/23/14 1347 11/24/14 0625  WBC 12.0*  --  10.7*  HGB 13.1 15.3* 10.4*  HCT 40.9 45.0 32.9*  PLT 298  --  274   BMET  Recent Labs  11/23/14 1339 11/23/14 1347 11/24/14 0625  NA 142 142 143  K 3.8 3.7 3.5  CL 108 110 111  CO2 18*  --  22  GLUCOSE 149* 145* 113*  BUN 10 11 6   CREATININE 1.38* 1.30* 0.88  CALCIUM 8.9  --  8.1*    Studies/Results: Ct Head Wo Contrast  11/23/2014   CLINICAL DATA:  Brain tumor resection 10/31/2014.  Syncopal episode.  EXAM: CT HEAD WITHOUT CONTRAST  TECHNIQUE: Contiguous axial images were obtained from the base of the skull through the vertex without intravenous contrast.  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 There is no identifiable residual tumor.  The brainstem and cerebellum are normal. There is low density in the right insular region most consistent with postoperative infarction. The area of involvement measures approximately 4.5 cm. No evidence of hematoma. No  extra-axial fluid collection. No hydrocephalus. No fluid in the sinuses.  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 4.5 cm region of low density centered in the insula on the right consistent with postoperative infarction. No hemorrhage, hydrocephalus or extra-axial collection.   Electronically Signed   By: Nelson Chimes M.D.   On: 11/23/2014 15:03   Ct Angio Chest Pe W/cm &/or Wo Cm  11/23/2014   CLINICAL DATA:  Syncope, loss of consciousness.  EXAM: CT ANGIOGRAPHY CHEST WITH CONTRAST  TECHNIQUE: Multidetector CT imaging of the chest was performed using the standard protocol during bolus administration of intravenous contrast. Multiplanar CT image reconstructions and MIPs were obtained to evaluate the vascular anatomy.  CONTRAST:  125mL OMNIPAQUE IOHEXOL 350 MG/ML SOLN  COMPARISON:  Chest radiograph of same day.  FINDINGS: No pneumothorax or pleural effusion is noted. No acute pulmonary disease is noted. There is no evidence of thoracic aortic aneurysm. Large bilateral pulmonary emboli are noted with linear embolus at the bifurcation of the main pulmonary artery consistent with saddle embolus. RV/LV ratio of greater than 2 is noted. No mediastinal mass or adenopathy is noted. Visualized portion of upper abdomen appears normal. No significant osseous abnormality is noted.  Review of the MIP images confirms the above findings.  IMPRESSION: Large bilateral central pulmonary emboli are noted. Positive for acute PE with CT evidence of right heart strain (RV/LV Ratio = 2.1) consistent with at least submassive (intermediate risk) PE.  The presence of right heart strain has been associated with an increased risk of morbidity and mortality. Please activate Code PE by paging [phone]. Critical Value/emergent results were called by telephone at the time of interpretation on 11/23/2014 at 3:06 pm to Dr. Quintella Reichert, who verbally acknowledged these results.   Electronically Signed    By: Marijo Conception, M.D.   On: 11/23/2014 15:07   Dg Chest Port 1 View  11/23/2014   CLINICAL DATA:  Fevers and loss of consciousness  EXAM: PORTABLE CHEST - 1 VIEW  COMPARISON:  05/19/2012  FINDINGS: Cardiac shadow is stable but mildly enlarged. The lungs are well aerated bilaterally. No focal infiltrate or sizable effusion is seen.  IMPRESSION: No acute abnormality noted.   Electronically Signed   By: Inez Catalina M.D.   On: 11/23/2014 13:53    Assessment/Plan: Pulmonary embolism: This is going to be treated with anticoagulation  Diabetes insipidus: DDAVP has been started.  LOS: 1 day     Darlyn Repsher D 11/24/2014, 9:15 AM

## 2014-11-25 DIAGNOSIS — I82402 Acute embolism and thrombosis of unspecified deep veins of left lower extremity: Secondary | ICD-10-CM

## 2014-11-25 LAB — HEPARIN LEVEL (UNFRACTIONATED): Heparin Unfractionated: 0.47 IU/mL (ref 0.30–0.70)

## 2014-11-25 LAB — URINALYSIS, ROUTINE W REFLEX MICROSCOPIC
Bilirubin Urine: NEGATIVE
GLUCOSE, UA: NEGATIVE mg/dL
Hgb urine dipstick: NEGATIVE
Ketones, ur: NEGATIVE mg/dL
Leukocytes, UA: NEGATIVE
Nitrite: NEGATIVE
PH: 6.5 (ref 5.0–8.0)
Protein, ur: NEGATIVE mg/dL
Specific Gravity, Urine: 1.008 (ref 1.005–1.030)
Urobilinogen, UA: 0.2 mg/dL (ref 0.0–1.0)

## 2014-11-25 LAB — TROPONIN I
TROPONIN I: 0.22 ng/mL — AB (ref <0.031)
TROPONIN I: 0.29 ng/mL — AB (ref <0.031)
TROPONIN I: 0.34 ng/mL — AB (ref <0.031)

## 2014-11-25 LAB — BASIC METABOLIC PANEL
Anion gap: 8 (ref 5–15)
BUN: 5 mg/dL — ABNORMAL LOW (ref 6–20)
CALCIUM: 8.2 mg/dL — AB (ref 8.9–10.3)
CO2: 21 mmol/L — AB (ref 22–32)
Chloride: 113 mmol/L — ABNORMAL HIGH (ref 101–111)
Creatinine, Ser: 0.96 mg/dL (ref 0.44–1.00)
GFR calc non Af Amer: >60 mL/min (ref >60)
GLUCOSE: 95 mg/dL (ref 65–99)
Potassium: 3.8 mmol/L (ref 3.5–5.1)
SODIUM: 142 mmol/L (ref 135–145)

## 2014-11-25 LAB — CBC
HCT: 33.9 % — ABNORMAL LOW (ref 36.0–46.0)
HEMOGLOBIN: 10.7 g/dL — AB (ref 12.0–15.0)
MCH: 27.9 pg (ref 26.0–34.0)
MCHC: 31.6 g/dL (ref 30.0–36.0)
MCV: 88.5 fL (ref 78.0–100.0)
Platelets: 289 10*3/uL (ref 150–400)
RBC: 3.83 MIL/uL — AB (ref 3.87–5.11)
RDW: 20.1 % — AB (ref 11.5–15.5)
WBC: 8.7 10*3/uL (ref 4.0–10.5)

## 2014-11-25 LAB — LACTIC ACID, PLASMA: LACTIC ACID, VENOUS: 1.4 mmol/L (ref 0.5–2.0)

## 2014-11-25 MED ORDER — DEXAMETHASONE 4 MG PO TABS
4.0000 mg | ORAL_TABLET | Freq: Every day | ORAL | Status: DC
  Administered 2014-11-25 – 2014-11-26 (×2): 4 mg via ORAL
  Filled 2014-11-25 (×3): qty 1

## 2014-11-25 NOTE — Progress Notes (Signed)
Utilization review completed.  

## 2014-11-25 NOTE — Progress Notes (Signed)
Patient ID: Amanda Murphy, female   DOB: 09-27-1980, 34 y.o.   MRN: [id number] Subjective:  The patient is alert and pleasant. She is having a bit less polyuria. She does complain of some intermittent chest pain and leg pain.  Objective: Vital signs in last 24 hours: Temp:  [98.4 F (36.9 C)-98.7 F (37.1 C)] 98.4 F (36.9 C) (06/19 0749) Pulse Rate:  [107-140] 108 (06/19 0600) Resp:  [0-34] 26 (06/19 0600) BP: (94-141)/(51-95) 110/71 mmHg (06/19 0600) SpO2:  [94 %-100 %] 94 % (06/19 0600) Weight:  [117.1 kg (258 lb 2.5 oz)] 117.1 kg (258 lb 2.5 oz) (06/19 0500)  Intake/Output from previous day: 06/18 0701 - 06/19 0700 In: 1676.8 [I.V.:1676.8] Out: 3050 [Urine:3050] Intake/Output this shift:    Physical exam the patient is alert and oriented 3. She is moving all 4 extremities well. Her speech is normal.  Lab Results:  Recent Labs  11/24/14 0625 11/25/14 0534  WBC 10.7* 8.7  HGB 10.4* 10.7*  HCT 32.9* 33.9*  PLT 274 289   BMET  Recent Labs  11/24/14 0625 11/25/14 0534  NA 143 142  K 3.5 3.8  CL 111 113*  CO2 22 21*  GLUCOSE 113* 95  BUN 6 5*  CREATININE 0.88 0.96  CALCIUM 8.1* 8.2*    Studies/Results: Ct Head Wo Contrast  11/23/2014   CLINICAL DATA:  Brain tumor resection 10/31/2014.  Syncopal episode.  EXAM: CT HEAD WITHOUT CONTRAST  TECHNIQUE: Contiguous axial images were obtained from the base of the skull through the vertex without intravenous contrast.  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 There is no identifiable residual tumor.  The brainstem and cerebellum are normal. There is low density in the right insular region most consistent with postoperative infarction. The area of involvement measures approximately 4.5 cm. No evidence of hematoma. No extra-axial fluid collection. No hydrocephalus. No fluid in the sinuses.  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 4.5 cm region of low density centered in the insula on the right consistent with postoperative infarction. No hemorrhage, hydrocephalus or extra-axial collection.   Electronically Signed   By: Nelson Chimes M.Murphy.   On: 11/23/2014 15:03   Ct Angio Chest Pe W/cm &/or Wo Cm  11/23/2014   CLINICAL DATA:  Syncope, loss of consciousness.  EXAM: CT ANGIOGRAPHY CHEST WITH CONTRAST  TECHNIQUE: Multidetector CT imaging of the chest was performed using the standard protocol during bolus administration of intravenous contrast. Multiplanar CT image reconstructions and MIPs were obtained to evaluate the vascular anatomy.  CONTRAST:  150mL OMNIPAQUE IOHEXOL 350 MG/ML SOLN  COMPARISON:  Chest radiograph of same day.  FINDINGS: No pneumothorax or pleural effusion is noted. No acute pulmonary disease is noted. There is no evidence of thoracic aortic aneurysm. Large bilateral pulmonary emboli are noted with linear embolus at the bifurcation of the main pulmonary artery consistent with saddle embolus. RV/LV ratio of greater than 2 is noted. No mediastinal mass or adenopathy is noted. Visualized portion of upper abdomen appears normal. No significant osseous abnormality is noted.  Review of the MIP images confirms the above findings.  IMPRESSION: Large bilateral central pulmonary emboli are noted. Positive for acute PE with CT evidence of right heart strain (RV/LV Ratio = 2.1) consistent with at least submassive (intermediate risk) PE. The presence of right heart strain has been associated with an increased risk of morbidity and mortality. Please activate Code  PE by paging [phone]. Critical Value/emergent results were called by telephone at the time of interpretation on 11/23/2014 at 3:06 pm to Dr. Quintella Reichert, who verbally acknowledged these results.   Electronically Signed   By: Marijo Conception, M.Murphy.   On: 11/23/2014 15:07   Dg Chest Port 1 View  11/23/2014    CLINICAL DATA:  Fevers and loss of consciousness  EXAM: PORTABLE CHEST - 1 VIEW  COMPARISON:  05/19/2012  FINDINGS: Cardiac shadow is stable but mildly enlarged. The lungs are well aerated bilaterally. No focal infiltrate or sizable effusion is seen.  IMPRESSION: No acute abnormality noted.   Electronically Signed   By: Inez Catalina M.Murphy.   On: 11/23/2014 13:53    Assessment/Plan: The patient is doing well neurologically.  LOS: 2 days     Amanda Murphy 11/25/2014, 8:12 AM

## 2014-11-25 NOTE — Progress Notes (Addendum)
ANTICOAGULATION CONSULT NOTE   Pharmacy Consult for heparin Indication: pulmonary embolus  No Known Allergies  Patient Measurements: IBW: 52.4 kg 110.7 kg Heparin Dosing Weight: 79 kg  Vital Signs: Temp: 98.4 F (36.9 C) (06/19 0749) Temp Source: Oral (06/19 0749) BP: 118/77 mmHg (06/19 0800) Pulse Rate: 109 (06/19 0800)  Labs:  Recent Labs  11/23/14 1339 11/23/14 1347  11/24/14 0625 11/24/14 1425 11/25/14 0040 11/25/14 0534  HGB 13.1 15.3*  --  10.4*  --   --  10.7*  HCT 40.9 45.0  --  32.9*  --   --  33.9*  PLT 298  --   --  274  --   --  289  HEPARINUNFRC  --   --   < > 0.41 0.49  --  0.47  CREATININE 1.38* 1.30*  --  0.88  --   --  0.96  TROPONINI  --   --   < > 0.66*  --  0.34* 0.29*  < > = values in this interval not displayed.  Estimated Creatinine Clearance: 101.1 mL/min (by C-G formula based on Cr of 0.96).   Medical History: Past Medical History  Diagnosis Date  . Vision abnormalities   . Headache(784.0)   . PCOS (polycystic ovarian syndrome)   . Fibroids   . Astigmatism   . Asthma     as child   . Pneumonia     3-4 yrs ago     Medications:  Prescriptions prior to admission  Medication Sig Dispense Refill Last Dose  . acetaminophen (TYLENOL) 500 MG tablet Take 1,000 mg by mouth every 6 (six) hours as needed for mild pain.   11/23/2014 at 230  . ALPRAZolam (XANAX) 0.5 MG tablet Take 1 tablet (0.5 mg total) by mouth at bedtime as needed for anxiety. 20 tablet 0 11/22/2014 at Unknown time  . calcium carbonate (TUMS - DOSED IN MG ELEMENTAL CALCIUM) 500 MG chewable tablet Chew 1 tablet by mouth daily.   11/23/2014 at Unknown time  . Cyanocobalamin (VITAMIN B 12 PO) Take 1 tablet by mouth daily.   2-3 weeks  . desmopressin (DDAVP) 0.1 MG tablet Take 0.05 mg by mouth 2 (two) times daily.   not yet started  . docusate sodium (COLACE) 100 MG capsule Take 100 mg by mouth 2 (two) times daily as needed for mild constipation.   over 30 days  .  etonogestrel-ethinyl estradiol (NUVARING) 0.12-0.015 MG/24HR vaginal ring Place 1 each vaginally every 28 (twenty-eight) days. Insert vaginally and leave in place for 3 consecutive weeks, then remove for 1 week.   unknown  . fluticasone (FLONASE) 50 MCG/ACT nasal spray Place 2 sprays into both nostrils daily. (Patient taking differently: Place 2 sprays into both nostrils daily as needed for allergies or rhinitis. ) 16 g 6 11/21/2014  . levETIRAcetam (KEPPRA XR) 500 MG 24 hr tablet Take 1 tablet (500 mg total) by mouth 2 (two) times daily. 1 tablet twice a day for 3 weeks, then 1 tablet daily for 2 weeks, then discontinue 56 tablet 0 11/23/2014 at Unknown time  . levocetirizine (XYZAL) 5 MG tablet Take 1 tablet (5 mg total) by mouth every evening. (Patient taking differently: Take 5 mg by mouth daily as needed for allergies. ) 30 tablet 3 march  . Multiple Vitamin (MULTIVITAMIN WITH MINERALS) TABS tablet Take 1 tablet by mouth daily.   11/23/2014 at Unknown time  . naproxen sodium (ANAPROX) 220 MG tablet Take 440 mg by mouth daily as needed (  pain, headache).    11/22/2014 at Unknown time  . oxyCODONE-acetaminophen (PERCOCET/ROXICET) 5-325 MG per tablet Take 1-2 tablets by mouth every 4 (four) hours as needed for moderate pain. 75 tablet 0 11/22/2014 at Unknown time    Assessment: 34 yo female admitted with syncopal episode and hypotension. 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 CTA demonstrated bilateral submassive PE with right heart strain. Pt is to begin a heparin gtt. CBC stable, eCrCl 60-70 ml/min.  AM HL remains therapeutic at 0.47 on heparin 1350 units/hr. No issues with infusion or bleeding noted. Hgb 10.7, Plt wnl, sCr 0.96. Trop 0.34>>0.29  Goal of Therapy:  Heparin level 0.3-0.7 units/ml Monitor platelets by anticoagulation protocol: Yes   Plan:  Continue heparin to 1350 units / hr  Daily HL, CBC Monitor for s/sx bleeding  Andrey Cota. Diona Foley,  PharmD Clinical Pharmacist Pager [phone]  11/25/2014 10:09 AM

## 2014-11-25 NOTE — Progress Notes (Signed)
PULMONARY / CRITICAL CARE MEDICINE   Name: Amanda Murphy MRN: [id number] DOB: Apr 25, 1981    ADMISSION DATE:  11/23/2014 CONSULTATION DATE:  11/23/14  REFERRING MD :  Dr. Ralene Bathe / EDP    CHIEF COMPLAINT:  Syncopal episode, tachycardia, hypotension    INITIAL PRESENTATION: 34 y/o F with recent hx (5/25-5/31) of a R suprasellar cyst tumor resection complicated by DI who presented to [facility] on 6/17 after a syncopal episode.  Found to be hypotensive with lactic acidosis and PCCM consulted for ICU admit.     STUDIES: 6/17  CTA of Chest >> large bilateral central PE, R heart strain with RV/LV ratio of 2.1 6/17  CT of Head >> R craniotomy for suprasellar craniopharyngioma resection, 4.5 cm region of low density in the insula on R c/w postoperative infarction  6/17  ECHO >> LV nml size, septal flattening c/w RV overload, LVEF 65-70%, mild RV dilation, PA peak 67 6/18  LE Doppler >> subacute DVT in L popliteal vein, L posterior tibial vein, L peroneal vein    SIGNIFICANT EVENTS: 5/25 - 5/31  Admit for R suprasellar cyst tumor, hemianopsia.  Course complicated by DI 2/02  Admit with hypotension / syncope.  CTA positive for large bilateral PE.  6/18  Episodes of chest pain, LLE pain      SUBJECTIVE:  Pt reports ongoing frequent urination with excessive thirst.  Frequency of urination has slowed.  Episode of substernal non-radiating chest pain last pm.  Troponin trending down.  Morphine relieved pain.  EKG with noted inferior ST changes.     VITAL SIGNS: Temp:  [98.4 F (36.9 C)-98.7 F (37.1 C)] 98.4 F (36.9 C) (06/19 0749) Pulse Rate:  [107-140] 108 (06/19 0600) Resp:  [0-34] 26 (06/19 0600) BP: (94-141)/(51-95) 110/71 mmHg (06/19 0600) SpO2:  [94 %-100 %] 94 % (06/19 0600) Weight:  [258 lb 2.5 oz (117.1 kg)] 258 lb 2.5 oz (117.1 kg) (06/19 0500)   HEMODYNAMICS:     VENTILATOR SETTINGS:     INTAKE / OUTPUT:  Intake/Output Summary (Last 24 hours) at 11/25/14  0813 Last data filed at 11/25/14 0600  Gross per 24 hour  Intake 1538.25 ml  Output   2600 ml  Net -1061.75 ml    PHYSICAL EXAMINATION: General:  Obese female in NAD Neuro:  AAOx4, speech clear, MAE HEENT:  MM pink/moist, no jvd Cardiovascular:  s1s2 rrr, tachy Lungs:  resp's even/non-labored, lungs bilaterally clear  Abdomen:  Obese/soft, NTND Musculoskeletal:  No acute deformities, Homans + LLE Skin:  Warm/dry, no LE edema, LE's symmetrical   LABS:  CBC  Recent Labs Lab 11/23/14 1339 11/23/14 1347 11/24/14 0625 11/25/14 0534  WBC 12.0*  --  10.7* 8.7  HGB 13.1 15.3* 10.4* 10.7*  HCT 40.9 45.0 32.9* 33.9*  PLT 298  --  274 289   Coag's No results for input(s): APTT, INR in the last 168 hours.   BMET  Recent Labs Lab 11/23/14 1339 11/23/14 1347 11/24/14 0625 11/25/14 0534  NA 142 142 143 142  K 3.8 3.7 3.5 3.8  CL 108 110 111 113*  CO2 18*  --  22 21*  BUN 10 11 6  5*  CREATININE 1.38* 1.30* 0.88 0.96  GLUCOSE 149* 145* 113* 95   Electrolytes  Recent Labs Lab 11/23/14 1339 11/24/14 0625 11/25/14 0534  CALCIUM 8.9 8.1* 8.2*     Sepsis Markers  Recent Labs Lab 11/23/14 1347 11/23/14 1538  LATICACIDVEN 5.83* 4.6*  PROCALCITON  --  <  0.10     ABG  Recent Labs Lab 11/23/14 1348  PHART 7.440  PCO2ART 23.2*  PO2ART 70.0*   Liver Enzymes  Recent Labs Lab 11/23/14 1339  AST 122*  ALT 79*  ALKPHOS 88  BILITOT 0.7  ALBUMIN 2.6*     Cardiac Enzymes  Recent Labs Lab 11/24/14 0625 11/25/14 0040 11/25/14 0534  TROPONINI 0.66* 0.34* 0.29*     Glucose  Recent Labs Lab 11/23/14 1344 11/23/14 1621  GLUCAP 138* 71    Imaging No results found.   ASSESSMENT / PLAN:  PULMONARY A: Dyspnea - in setting of large bilateral PE Sub-Massive Bilateral PE  PAH / RV Strain on ECHO  P:   Cleared with Dr. Rita Ohara for anticoagulation  Oxygen as needed for sats > 92% NOT a candidate for thrombolysis given recent NSGY Will likely  need minimum of 6 mo to 1 year therapy  Pt would like to use Xarelto (for once daily dosing) Given chest pain episodes, will wait until am 6/20 to transition to oral agent Will need to discuss contraceptive options with GYN prior to discharge  CARDIOVASCULAR CVL A:  Tachycardia - suspect in setting of volume depletion Chest Pain - intermittent episodes during admit with inferior ST changes Hypotension - resolving with NS  Syncopal Episode - in setting of PE.  ECHO with mild RV dilation, systolic fxn mod-severely reduced, PA pressure 67 P:  D5 LR @ KVO Trend troponin  Repeat EKG in am   RENAL A:   Lactic Acidosis  AKI P:   Trend lactate, repeat with next lab draw Monitor BMP / UOP   GASTROINTESTINAL A:   Protein Calorie Malnutrition - albumin 2.6 on admit Morbid Obesity  P:   Diet as tolerated   HEMATOLOGIC A:   Sub-Massive Bilateral PE  LLE DVT  P:  SCD's for now Heparin gtt for PE  INFECTIOUS A:   R/O Sepsis / Infectious etiology  P:   BCx2 6/17 >>  UC 6/17 >>   Monitor fever curve / leukocytosis  PCT negative   ENDOCRINE A:   Hyperglycemia   P:   Monitor glucose If treated with stress dose steroids, will need to add SSI   NEUROLOGIC A:   Suprasellar Mass Resection (5/25) Syncopal Event - in setting of large bilateral PE Diabetes Insipidus P:   RASS goal: 0 Minimize sedating medications  Continue Keppra  Dr. Rita Ohara notified of admission, cleared for anticoagulation  DDAVP 0.05 mg BID (followed by Dr. Wylene Simmer) Add Decadron 4mg  QD    FAMILY  - Updates: Patient updated at bedside 6/19.   GLOBAL:  Pt to remain in ICU on heparin gtt with episodes of chest pain.  Likely to transition to oral anticoagulation in am 6/20.     Noe Gens, NP-C [facility] Pgr: [phone] or [phone] 11/25/2014, 8:13 AM

## 2014-11-26 ENCOUNTER — Inpatient Hospital Stay (HOSPITAL_COMMUNITY): Payer: 59

## 2014-11-26 DIAGNOSIS — Z86711 Personal history of pulmonary embolism: Secondary | ICD-10-CM | POA: Insufficient documentation

## 2014-11-26 DIAGNOSIS — I2699 Other pulmonary embolism without acute cor pulmonale: Secondary | ICD-10-CM | POA: Insufficient documentation

## 2014-11-26 LAB — BASIC METABOLIC PANEL
ANION GAP: 10 (ref 5–15)
Anion gap: 11 (ref 5–15)
BUN: 7 mg/dL (ref 6–20)
BUN: 9 mg/dL (ref 6–20)
CHLORIDE: 108 mmol/L (ref 101–111)
CO2: 23 mmol/L (ref 22–32)
CO2: 23 mmol/L (ref 22–32)
Calcium: 8.5 mg/dL — ABNORMAL LOW (ref 8.9–10.3)
Calcium: 8.8 mg/dL — ABNORMAL LOW (ref 8.9–10.3)
Chloride: 107 mmol/L (ref 101–111)
Creatinine, Ser: 0.82 mg/dL (ref 0.44–1.00)
Creatinine, Ser: 0.85 mg/dL (ref 0.44–1.00)
GFR calc Af Amer: >60 mL/min (ref >60)
Glucose, Bld: 106 mg/dL — ABNORMAL HIGH (ref 65–99)
Glucose, Bld: 97 mg/dL (ref 65–99)
POTASSIUM: 3.8 mmol/L (ref 3.5–5.1)
Potassium: 3.7 mmol/L (ref 3.5–5.1)
Sodium: 141 mmol/L (ref 135–145)
Sodium: 141 mmol/L (ref 135–145)

## 2014-11-26 LAB — CBC
HEMATOCRIT: 35.2 % — AB (ref 36.0–46.0)
HEMOGLOBIN: 11.1 g/dL — AB (ref 12.0–15.0)
MCH: 27.8 pg (ref 26.0–34.0)
MCHC: 31.5 g/dL (ref 30.0–36.0)
MCV: 88 fL (ref 78.0–100.0)
Platelets: 330 10*3/uL (ref 150–400)
RBC: 4 MIL/uL (ref 3.87–5.11)
RDW: 19.4 % — ABNORMAL HIGH (ref 11.5–15.5)
WBC: 9.4 10*3/uL (ref 4.0–10.5)

## 2014-11-26 LAB — OSMOLALITY, URINE: OSMOLALITY UR: 220 mosm/kg — AB (ref 390–1090)

## 2014-11-26 LAB — HEPARIN LEVEL (UNFRACTIONATED): HEPARIN UNFRACTIONATED: 0.29 [IU]/mL — AB (ref 0.30–0.70)

## 2014-11-26 LAB — T4, FREE: FREE T4: 0.91 ng/dL (ref 0.61–1.12)

## 2014-11-26 LAB — TSH: TSH: 0.239 u[IU]/mL — AB (ref 0.350–4.500)

## 2014-11-26 MED ORDER — DESMOPRESSIN ACETATE 0.1 MG PO TABS
0.0500 mg | ORAL_TABLET | Freq: Three times a day (TID) | ORAL | Status: DC
  Administered 2014-11-26 (×2): 0.05 mg via ORAL
  Filled 2014-11-26 (×6): qty 1

## 2014-11-26 MED ORDER — SODIUM CHLORIDE 0.9 % IV SOLN
INTRAVENOUS | Status: DC
  Administered 2014-11-26 (×2): via INTRAVENOUS

## 2014-11-26 MED ORDER — RIVAROXABAN 15 MG PO TABS
15.0000 mg | ORAL_TABLET | Freq: Two times a day (BID) | ORAL | Status: DC
  Administered 2014-11-26 – 2014-11-29 (×7): 15 mg via ORAL
  Filled 2014-11-26 (×10): qty 1

## 2014-11-26 NOTE — Progress Notes (Signed)
Attempted call to 2W.  Nurse will call back

## 2014-11-26 NOTE — Progress Notes (Addendum)
PULMONARY / CRITICAL CARE MEDICINE   Name: Amanda Murphy MRN: [id number] DOB: April 15, 1981    ADMISSION DATE:  11/23/2014 CONSULTATION DATE:  11/23/14  REFERRING MD :  Dr. Ralene Bathe / EDP    CHIEF COMPLAINT:  Syncopal episode, tachycardia, hypotension    INITIAL PRESENTATION: 34 y/o F with recent hx (5/25-5/31) of a R suprasellar cyst tumor resection complicated by DI who presented to [facility] on 6/17 after a syncopal episode.  Found to be hypotensive with lactic acidosis and PCCM consulted for ICU admit.     STUDIES: 6/17  CTA of Chest >> large bilateral central PE, R heart strain with RV/LV ratio of 2.1 6/17  CT of Head >> R craniotomy for suprasellar craniopharyngioma resection, 4.5 cm region of low density in the insula on R c/w postoperative infarction  6/17  ECHO >> LV nml size, septal flattening c/w RV overload, LVEF 65-70%, mild RV dilation, PA peak 67 6/18  LE Doppler >> subacute DVT in L popliteal vein, L posterior tibial vein, L peroneal vein    SIGNIFICANT EVENTS: 5/25 - 5/31  Admit for R suprasellar cyst tumor, hemianopsia.  Course complicated by DI 5/42  Admit with hypotension / syncope.  CTA positive for large bilateral PE.  6/18  Episodes of chest pain, LLE pain  6/19- high urine output  SUBJECTIVE: no CP, no SOb  VITAL SIGNS: Temp:  [97.9 F (36.6 C)-98.7 F (37.1 C)] 97.9 F (36.6 C) (06/20 0736) Pulse Rate:  [92-121] 115 (06/20 1015) Resp:  [13-29] 24 (06/20 1015) BP: (102-136)/(59-99) 127/82 mmHg (06/20 1015) SpO2:  [93 %-99 %] 98 % (06/20 1015) Weight:  [116 kg (255 lb 11.7 oz)] 116 kg (255 lb 11.7 oz) (06/20 0500)   HEMODYNAMICS:     VENTILATOR SETTINGS:     INTAKE / OUTPUT:  Intake/Output Summary (Last 24 hours) at 11/26/14 1123 Last data filed at 11/26/14 1000  Gross per 24 hour  Intake 1219.03 ml  Output   6700 ml  Net -5480.97 ml    PHYSICAL EXAMINATION: General:  Obese female in NAD Neuro:  AAOx4, speech clear, MAE HEENT:  jvd down Cardiovascular:  s1s2 rrr, tachy 112 Lungs:  clear  Abdomen:  Obese/soft, NTND Musculoskeletal:  No acute deformities, Homans + LLE Skin:  No rash  LABS:  CBC  Recent Labs Lab 11/24/14 0625 11/25/14 0534 11/26/14 0316  WBC 10.7* 8.7 9.4  HGB 10.4* 10.7* 11.1*  HCT 32.9* 33.9* 35.2*  PLT 274 289 330   Coag's No results for input(s): APTT, INR in the last 168 hours.   BMET  Recent Labs Lab 11/24/14 0625 11/25/14 0534 11/26/14 0316  NA 143 142 141  K 3.5 3.8 3.7  CL 111 113* 108  CO2 22 21* 23  BUN 6 5* 7  CREATININE 0.88 0.96 0.82  GLUCOSE 113* 95 106*   Electrolytes  Recent Labs Lab 11/24/14 0625 11/25/14 0534 11/26/14 0316  CALCIUM 8.1* 8.2* 8.5*     Sepsis Markers  Recent Labs Lab 11/23/14 1347 11/23/14 1538 11/25/14 1208  LATICACIDVEN 5.83* 4.6* 1.4  PROCALCITON  --  <0.10  --      ABG  Recent Labs Lab 11/23/14 1348  PHART 7.440  PCO2ART 23.2*  PO2ART 70.0*   Liver Enzymes  Recent Labs Lab 11/23/14 1339  AST 122*  ALT 79*  ALKPHOS 88  BILITOT 0.7  ALBUMIN 2.6*     Cardiac Enzymes  Recent Labs Lab 11/25/14 0040 11/25/14 0534 11/25/14 1208  TROPONINI 0.34* 0.29* 0.22*     Glucose  Recent Labs Lab 11/23/14 1344 11/23/14 1621  GLUCAP 138* 71    Imaging Dg Chest Port 1 View  11/26/2014   CLINICAL DATA:  Pneumonia.  Asthma.  EXAM: PORTABLE CHEST - 1 VIEW  COMPARISON:  11/23/2014.  CT 11/23/2014.  FINDINGS: Mediastinum hilar structures are normal. Mild cardiomegaly. No pulmonary venous congestion. Low lung volumes with basilar atelectasis. Tiny left pleural effusion cannot be excluded. No pneumothorax.  IMPRESSION: 1. Borderline cardiomegaly.  No pulmonary venous congestion. 2. Low lung volumes with mild basilar atelectasis. Tiny left pleural effusion cannot be excluded .   Electronically Signed   By: Marcello Moores  Register   On: 11/26/2014 07:25     ASSESSMENT / PLAN:  PULMONARY A: Dyspnea - in setting of  large bilateral PE Sub-Massive Bilateral PE  PAH / RV Strain on ECHO  P:   Cleared with Dr. Rita Ohara for anticoagulation  Oxygen as needed for sats > 92% NOT a candidate for thrombolysis given recent NSGY Will likely need minimum of 6 mo to 1 year therapy  Pt would like to use Xarelto (for once daily dosing) - start today , will make her aware of no reversal agents as compared to coumadin Will need to discuss contraceptive options with GYN prior to discharge  CARDIOVASCULAR CVL A:  Tachycardia - suspect in setting of volume depletion Chest Pain - intermittent episodes during admit with inferior ST changes Hypotension - resolving with NS  Syncopal Episode - in setting of PE.  ECHO with mild RV dilation, systolic fxn mod-severely reduced, PA pressure 67 P:  She is KVO, but with DI, may need to start to even balance until we control DI better Strict I/ O Tele Will need work up for pre existing PA htn, sleep study? Outpt work up  RENAL A:   Lactic Acidosis low DI with elevated urine output Sig output high AKI P:   Monitor BMP / UOP  Send urine osm Start 1/2 NS to even balance goals as we titrate up ddavp  bmet to q12h durin this period high output  GASTROINTESTINAL A:   Protein Calorie Malnutrition - albumin 2.6 on admit Morbid Obesity  P:   Diet as tolerated   HEMATOLOGIC A:   Sub-Massive Bilateral PE  LLE DVT  P:  SCD's for now Heparin gtt for PE, start oral agent as no CP further  INFECTIOUS A:   R/O Sepsis / Infectious etiology  P:   BCx2 6/17 >>  UC 6/17 >>   UA neg  ENDOCRINE A:   Hyperglycemia Presumed pan hypopit, DI P:   Steroids ddavp 0.5 bid, had 6 liters output last 24 hrs but last few hrs better likely to tid Send urine osm Consider increase if able, may need vaso drip Obtain tsh, t4, t3  NEUROLOGIC A:   Suprasellar Mass Resection (5/25) Syncopal Event - in setting of large bilateral PE Diabetes Insipidus P:   RASS goal:  0 Minimize sedating medications  Continue Keppra  DDAVP 0.05 mg BID (followed by Dr. Wylene Simmer), will increase to tid Maintain Decadron 4mg  QD    FAMILY  - Updates: Patient updated at bedside 6/19.  To transfer tele On RA  Lavon Paganini. Titus Mould, MD, [place] Pgr: [facility]

## 2014-11-26 NOTE — Progress Notes (Signed)
ANTICOAGULATION CONSULT NOTE   Pharmacy Consult for heparin Indication: pulmonary embolus  No Known Allergies  Patient Measurements: IBW: 52.4 kg 110.7 kg Heparin Dosing Weight: 79 kg  Vital Signs: Temp: 97.9 F (36.6 C) (06/20 0053) Temp Source: Oral (06/20 0053) BP: 109/70 mmHg (06/20 0300) Pulse Rate: 103 (06/20 0300)  Labs:  Recent Labs  11/23/14 1347  11/24/14 0625 11/24/14 1425 11/25/14 0040 11/25/14 0534 11/25/14 1208 11/26/14 0316  HGB 15.3*  --  10.4*  --   --  10.7*  --  11.1*  HCT 45.0  --  32.9*  --   --  33.9*  --  35.2*  PLT  --   --  274  --   --  289  --  330  HEPARINUNFRC  --   < > 0.41 0.49  --  0.47  --  0.29*  CREATININE 1.30*  --  0.88  --   --  0.96  --   --   TROPONINI  --   < > 0.66*  --  0.34* 0.29* 0.22*  --   < > = values in this interval not displayed.  Estimated Creatinine Clearance: 101.1 mL/min (by C-G formula based on Cr of 0.96).   Assessment: 34 yo female admitted with syncopal episode and hypotension. 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 CTA demonstrated bilateral submassive PE with right heart strain. Pt continues on heparin gtt. CBC stable, eCrCl 60-70 ml/min.  Heparin level down to 0.29 (slightly subtherapeutic) on heparin 1350 units/hr. No issues with infusion or bleeding noted. CBC stable.  Goal of Therapy:  Heparin level 0.3-0.7 units/ml Monitor platelets by anticoagulation protocol: Yes   Plan:  Increase heparin to 1500 units / hr  F/u 6 hr heparin level  Sherlon Handing, PharmD, BCPS Clinical pharmacist, pager [phone]  11/26/2014 4:17 AM

## 2014-11-26 NOTE — Progress Notes (Signed)
Subjective: Patient resting in bed comfortably, explains that she had difficulty with high urine outputs over the weekend, and asked the staff to place a Foley catheter. Also had difficulties with chest pain over the weekend, that is doing better. Continues on DDAVP 0.05 mg by mouth twice a day. Electrolytes look good. Has been converted from heparin drip to [facility] for large pulmonary embolus.  Objective: Vital signs in last 24 hours: Filed Vitals:   11/26/14 1100 11/26/14 1200 11/26/14 1203 11/26/14 1300  BP: 110/70 120/74  150/87  Pulse: 113 114  106  Temp:   97.9 F (36.6 C) 97.8 F (36.6 C)  TempSrc:   Oral Oral  Resp: 22 19  20   Height:      Weight:      SpO2: 94% 95%  96%    Intake/Output from previous day: 06/19 0701 - 06/20 0700 In: 861.5 [P.O.:440; I.V.:421.5] Out: 6245 [Urine:6245] Intake/Output this shift: Total I/O In: 718.8 [P.O.:590; I.V.:128.8] Out: 1675 [Urine:1675]  Physical Exam:  Awake alert, oriented. Speech fluent. Good comprehension. Cranial incision nicely healed. Moving all extremities well.  CBC  Recent Labs  11/25/14 0534 11/26/14 0316  WBC 8.7 9.4  HGB 10.7* 11.1*  HCT 33.9* 35.2*  PLT 289 330   BMET  Recent Labs  11/26/14 0316 11/26/14 1325  NA 141 141  K 3.7 3.8  CL 108 107  CO2 23 23  GLUCOSE 106* 97  BUN 7 9  CREATININE 0.82 0.85  CALCIUM 8.5* 8.8*   ABG    Component Value Date/Time   PHART 7.440 11/23/2014 1348   PCO2ART 23.2* 11/23/2014 1348   PO2ART 70.0* 11/23/2014 1348   HCO3 15.7* 11/23/2014 1348   TCO2 16 11/23/2014 1348   ACIDBASEDEF 7.0* 11/23/2014 1348   O2SAT 95.0 11/23/2014 1348    Studies/Results: Dg Chest Port 1 View  11/26/2014   CLINICAL DATA:  Pneumonia.  Asthma.  EXAM: PORTABLE CHEST - 1 VIEW  COMPARISON:  11/23/2014.  CT 11/23/2014.  FINDINGS: Mediastinum hilar structures are normal. Mild cardiomegaly. No pulmonary venous congestion. Low lung volumes with basilar atelectasis. Tiny left pleural  effusion cannot be excluded. No pneumothorax.  IMPRESSION: 1. Borderline cardiomegaly.  No pulmonary venous congestion. 2. Low lung volumes with mild basilar atelectasis. Tiny left pleural effusion cannot be excluded .   Electronically Signed   By: Marcello Moores  Register   On: 11/26/2014 07:25    Assessment/Plan: Stable from a neurosurgical perspective. May need to have her endocrinologist Dr. Dagmar Hait involved in adjustments to DDAVP dosing. Can continue Foley, the patient wishes, but is not needed from a neurosurgical perspective. Also patient can be ambulatory from a neurosurgical perspective, but we'll defer to PCCM.   Hosie Spangle, MD 11/26/2014, 3:31 PM

## 2014-11-26 NOTE — Progress Notes (Signed)
Called report to 2W.  All questions answered.

## 2014-11-26 NOTE — Progress Notes (Signed)
[place] for Xarelto (stop IV heparin) Indication: pulmonary embolus  No Known Allergies  Patient Measurements: IBW: 52.4 kg 110.7 kg Heparin Dosing Weight: 79 kg  Vital Signs: Temp: 97.9 F (36.6 C) (06/20 0736) Temp Source: Oral (06/20 0736) BP: 127/82 mmHg (06/20 1015) Pulse Rate: 115 (06/20 1015)  Labs:  Recent Labs  11/24/14 0625 11/24/14 1425 11/25/14 0040 11/25/14 0534 11/25/14 1208 11/26/14 0316  HGB 10.4*  --   --  10.7*  --  11.1*  HCT 32.9*  --   --  33.9*  --  35.2*  PLT 274  --   --  289  --  330  HEPARINUNFRC 0.41 0.49  --  0.47  --  0.29*  CREATININE 0.88  --   --  0.96  --  0.82  TROPONINI 0.66*  --  0.34* 0.29* 0.22*  --     Estimated Creatinine Clearance: 117.6 mL/min (by C-G formula based on Cr of 0.82).   Assessment: 34 yo female admitted with saddle PE and L-DVT. 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 Patient is resistant to Coumadin and would like Xarelto.  H/H 11.1/35.2, Plts 330. No bleeding reported.  SCr 0.82/ estimated CrCl >100 cc/min.  Patient has been weaned to 2L [place].   Goal of Therapy:  Monitor platelets by anticoagulation protocol: Yes   Plan:  Xarelto 15mg  po BID x21 days then decrease to 20mg  once daily.  Stop heparin at the same time you give the Xarelto (communicated with the RN).  Educate once out of ICU and closer to discharge.   Sloan Leiter, PharmD, BCPS Clinical Pharmacist [phone] 11/26/2014 11:51 AM

## 2014-11-27 DIAGNOSIS — E232 Diabetes insipidus: Secondary | ICD-10-CM

## 2014-11-27 LAB — T3, FREE: T3 FREE: 2.5 pg/mL (ref 2.0–4.4)

## 2014-11-27 LAB — CBC
HCT: 34.5 % — ABNORMAL LOW (ref 36.0–46.0)
HEMOGLOBIN: 11.1 g/dL — AB (ref 12.0–15.0)
MCH: 28.2 pg (ref 26.0–34.0)
MCHC: 32.2 g/dL (ref 30.0–36.0)
MCV: 87.8 fL (ref 78.0–100.0)
Platelets: 375 10*3/uL (ref 150–400)
RBC: 3.93 MIL/uL (ref 3.87–5.11)
RDW: 19.6 % — AB (ref 11.5–15.5)
WBC: 11.3 10*3/uL — ABNORMAL HIGH (ref 4.0–10.5)

## 2014-11-27 LAB — BASIC METABOLIC PANEL
Anion gap: 9 (ref 5–15)
BUN: 10 mg/dL (ref 6–20)
CHLORIDE: 111 mmol/L (ref 101–111)
CO2: 21 mmol/L — AB (ref 22–32)
CREATININE: 0.78 mg/dL (ref 0.44–1.00)
Calcium: 8.2 mg/dL — ABNORMAL LOW (ref 8.9–10.3)
GFR calc Af Amer: >60 mL/min (ref >60)
GFR calc non Af Amer: >60 mL/min (ref >60)
Glucose, Bld: 131 mg/dL — ABNORMAL HIGH (ref 65–99)
Potassium: 3.4 mmol/L — ABNORMAL LOW (ref 3.5–5.1)
SODIUM: 141 mmol/L (ref 135–145)

## 2014-11-27 MED ORDER — DEXAMETHASONE 0.5 MG PO TABS: 1.0000 mg | ORAL_TABLET | Freq: Every day | ORAL | Status: DC

## 2014-11-27 MED ORDER — POTASSIUM CHLORIDE CRYS ER 20 MEQ PO TBCR
40.0000 meq | EXTENDED_RELEASE_TABLET | Freq: Once | ORAL | Status: AC
  Administered 2014-11-27: 40 meq via ORAL
  Filled 2014-11-27: qty 2

## 2014-11-27 MED ORDER — DEXAMETHASONE 2 MG PO TABS
2.0000 mg | ORAL_TABLET | Freq: Every day | ORAL | Status: DC
  Administered 2014-11-27: 2 mg via ORAL
  Filled 2014-11-27 (×2): qty 1

## 2014-11-27 MED ORDER — DEXAMETHASONE 0.5 MG PO TABS: 0.5000 mg | ORAL_TABLET | Freq: Every day | ORAL | Status: DC

## 2014-11-27 MED ORDER — DESMOPRESSIN ACETATE 0.1 MG PO TABS
0.1000 mg | ORAL_TABLET | Freq: Three times a day (TID) | ORAL | Status: DC
  Administered 2014-11-27 – 2014-11-28 (×4): 0.1 mg via ORAL
  Filled 2014-11-27 (×6): qty 1

## 2014-11-27 NOTE — Progress Notes (Signed)
Pt wants foley catheter to be d/c tomorrow morning. Pt wants to rest tonight.

## 2014-11-27 NOTE — Care Management Note (Addendum)
Case Management Note Marvetta Gibbons RN, BSN Unit 2W-Case Manager [phone]  Patient Details  Name: Amanda Murphy MRN: [id number] Date of Birth: 1981-03-16  Subjective/Objective:     Pt admitted with PE/DVT, hx of recent craniotomy               Action/Plan: PTA pt lived at home with family- has good support- anticipate return home when medically stable-   Expected Discharge Date:                  Expected Discharge Plan:  Home/Self Care  In-House Referral:     Discharge planning Services  CM Consult, Medication Assistance  Post Acute Care Choice:    Choice offered to:     DME Arranged:    DME Agency:     HH Arranged:    [place] Agency:     Status of Service:  In process, will continue to follow  Medicare Important Message Given:  No Date Medicare IM Given:    Medicare IM give by:    Date Additional Medicare IM Given:    Additional Medicare Important Message give by:     If discussed at [place] of Stay Meetings, dates discussed:    Additional Comments:  6/21/6- pt started on Xarelto- per benefits check- Per [facility]:   Xarelto: Free with co-pay assistance or $25 w/out  No auth required on either/ prices valid at [employer] with pt at bedside regarding benefit coverage for Xarelto- 30 day free card along with copay assist card given to pt for use at discharge- no questions by pt- pt states that she does use [facility]- drug is in-stock there.   Dawayne Patricia, RN 11/27/2014, 12:20 PM

## 2014-11-27 NOTE — Progress Notes (Signed)
Subjective: Patient resting in bed, comfortable. Feels urine output is still high, but is going to assess the staff to DC Foley.  Reviewing record, patient was started on Decadron 4 mg daily on the day after admission. It is unclear why. Certainly the patient doesn't need Decadron from a neurosurgical perspective, and I doubt that she requires any steroid support from an endocrinologic perspective.  Objective: Vital signs in last 24 hours: Filed Vitals:   11/26/14 1203 11/26/14 1300 11/26/14 2031 11/27/14 0346  BP:  150/87 122/84 116/81  Pulse:  106 104 108  Temp: 97.9 F (36.6 C) 97.8 F (36.6 C) 98.6 F (37 C) 98.1 F (36.7 C)  TempSrc: Oral Oral Oral Oral  Resp:  20 18 18   Height:      Weight:    116.62 kg (257 lb 1.6 oz)  SpO2:  96% 96% 96%    Intake/Output from previous day: 06/20 0701 - 06/21 0700 In: 2005.4 [P.O.:830; I.V.:1175.4] Out: 8100 [Urine:8100] Intake/Output this shift:    Physical Exam:  Awake alert, oriented. Moving all extremities well. Incision well-healed.  CBC  Recent Labs  11/26/14 0316 11/27/14 0108  WBC 9.4 11.3*  HGB 11.1* 11.1*  HCT 35.2* 34.5*  PLT 330 375   BMET  Recent Labs  11/26/14 1325 11/27/14 0108  NA 141 141  K 3.8 3.4*  CL 107 111  CO2 23 21*  GLUCOSE 97 131*  BUN 9 10  CREATININE 0.85 0.78  CALCIUM 8.8* 8.2*    Assessment/Plan: Stable from a neurosurgical perspective. Sodium remained stable. Will need follow-up with Dr. Dagmar Hait after discharge for endocrinologic matters. She is already scheduled for follow-up with me next week.  Have gone ahead and initiated Decadron taper, since she doesn't need Decadron from a neurosurgical perspective, and probably not from an endocrinologic perspective.  Hosie Spangle, MD 11/27/2014, 7:33 AM

## 2014-11-27 NOTE — Discharge Instructions (Signed)
Information on my medicine - XARELTO (rivaroxaban)  This medication education was reviewed with me or my healthcare representative as part of my discharge preparation.  The pharmacist that spoke with me during my hospital stay was:  Aimi Essner C, [place]? Xarelto was prescribed to treat blood clots that may have been found in the veins of your legs (deep vein thrombosis) or in your lungs (pulmonary embolism) and to reduce the risk of them occurring again.  What do you need to know about Xarelto? The starting dose is one 15 mg tablet taken TWICE daily with food for the FIRST 21 DAYS then on July 12th  the dose is changed to one 20 mg tablet taken ONCE A DAY with your evening meal.  DO NOT stop taking Xarelto without talking to the health care provider who prescribed the medication.  Refill your prescription for 20 mg tablets before you run out.  After discharge, you should have regular check-up appointments with your healthcare provider that is prescribing your Xarelto.  In the future your dose may need to be changed if your kidney function changes by a significant amount.  What do you do if you miss a dose? If you are taking Xarelto TWICE DAILY and you miss a dose, take it as soon as you remember. You may take two 15 mg tablets (total 30 mg) at the same time then resume your regularly scheduled 15 mg twice daily the next day.  If you are taking Xarelto ONCE DAILY and you miss a dose, take it as soon as you remember on the same day then continue your regularly scheduled once daily regimen the next day. Do not take two doses of Xarelto at the same time.   Important Safety Information Xarelto is a blood thinner medicine that can cause bleeding. You should call your healthcare provider right away if you experience any of the following: ? Bleeding from an injury or your nose that does not stop. ? Unusual colored urine (red or dark brown) or unusual  colored stools (red or black). ? Unusual bruising for unknown reasons. ? A serious fall or if you hit your head (even if there is no bleeding).  Some medicines may interact with Xarelto and might increase your risk of bleeding while on Xarelto. To help avoid this, consult your healthcare provider or pharmacist prior to using any new prescription or non-prescription medications, including herbals, vitamins, non-steroidal anti-inflammatory drugs (NSAIDs) and supplements.  This website has more information on Xarelto: [email or web address]

## 2014-11-27 NOTE — Progress Notes (Signed)
PULMONARY / CRITICAL CARE MEDICINE   Name: Amanda Murphy MRN: [id number] DOB: 04/20/1981    ADMISSION DATE:  11/23/2014 CONSULTATION DATE:  11/23/14  REFERRING MD :  Dr. 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  Found to be hypotensive with lactic acidosis and PCCM consulted for ICU admit.     STUDIES: 6/17  CTA of Chest >> large bilateral central PE, R heart strain with RV/LV ratio of 2.1 6/17  CT of Head >> R craniotomy for suprasellar craniopharyngioma resection, 4.5 cm region of low density in the insula on R c/w postoperative infarction  6/17  ECHO >> LV nml size, septal flattening c/w RV overload, LVEF 65-70%, mild RV dilation, PA peak 67 6/18  LE Doppler >> subacute DVT in L popliteal vein, L posterior tibial vein, L peroneal vein    SIGNIFICANT EVENTS: 5/25 - 5/31  Admit for R suprasellar cyst tumor, hemianopsia.  Course complicated by DI 7/06  Admit with hypotension / syncope.  CTA positive for large bilateral PE.  6/18  Episodes of chest pain, LLE pain  6/19- high urine output  SUBJECTIVE:  Increased UO no CP, no SOb Anxiety about reimaging for clots  VITAL SIGNS: Temp:  [97.8 F (36.6 C)-98.6 F (37 C)] 98.1 F (36.7 C) (06/21 0346) Pulse Rate:  [104-114] 108 (06/21 0346) Resp:  [18-22] 18 (06/21 0346) BP: (110-150)/(70-87) 116/81 mmHg (06/21 0346) SpO2:  [94 %-96 %] 96 % (06/21 0346) Weight:  [257 lb 1.6 oz (116.62 kg)] 257 lb 1.6 oz (116.62 kg) (06/21 0346)   HEMODYNAMICS:     VENTILATOR SETTINGS:     INTAKE / OUTPUT:  Intake/Output Summary (Last 24 hours) at 11/27/14 1020 Last data filed at 11/27/14 0700  Gross per 24 hour  Intake 2860.42 ml  Output   7625 ml  Net -4764.58 ml    PHYSICAL EXAMINATION: General:  Obese  female in NAD, sitting in bed Neuro:  AAOx4, speech clear, MAE HEENT: jvd down Cardiovascular:  s1s2 rrr Lungs:  clear  Abdomen:  Obese/soft, NTND Musculoskeletal:  No acute deformities, Homans + LLE Skin:  No rash  LABS:  CBC  Recent Labs Lab 11/25/14 0534 11/26/14 0316 11/27/14 0108  WBC 8.7 9.4 11.3*  HGB 10.7* 11.1* 11.1*  HCT 33.9* 35.2* 34.5*  PLT 289 330 375   Coag's No results for input(s): APTT, INR in the last 168 hours.   BMET  Recent Labs Lab 11/26/14 0316 11/26/14 1325 11/27/14 0108  NA 141 141 141  K 3.7 3.8 3.4*  CL 108 107 111  CO2 23 23 21*  BUN 7 9 10   CREATININE 0.82 0.85 0.78  GLUCOSE 106* 97 131*   Electrolytes  Recent Labs Lab 11/26/14 0316 11/26/14 1325 11/27/14 0108  CALCIUM 8.5* 8.8* 8.2*     Sepsis Markers  Recent Labs Lab 11/23/14 1347 11/23/14 1538 11/25/14 1208  LATICACIDVEN 5.83* 4.6* 1.4  PROCALCITON  --  <0.10  --      ABG  Recent Labs Lab 11/23/14 1348  PHART 7.440  PCO2ART 23.2*  PO2ART 70.0*   Liver Enzymes  Recent Labs Lab 11/23/14 1339  AST 122*  ALT 79*  ALKPHOS 88  BILITOT 0.7  ALBUMIN 2.6*     Cardiac Enzymes  Recent Labs Lab 11/25/14 0040 11/25/14 0534 11/25/14 1208  TROPONINI 0.34* 0.29* 0.22*     Glucose  Recent Labs Lab 11/23/14 1344 11/23/14 1621  GLUCAP 138* 71    Imaging No results found.   ASSESSMENT / PLAN:  PULMONARY A: Sub-Massive Bilateral PE  - RV Strain on ECHO -NOT a candidate for thrombolysis given recent NSGY P: Off o2 Will likely need minimum of 6 mo to 1 year therapy  ct Xarelto  Will need to discuss contraceptive options with GYN prior to discharge  CARDIOVASCULAR CVL A:  Tachycardia - improving Chest Pain - intermittent episodes during admit with inferior ST changes Hypotension - resolved Syncopal Episode - in setting of PE.  ECHO with mild RV dilation, systolic fxn mod-severely reduced, PA pressure 67 P:  Dc IVFs Strict I/  O Tele Will need work up for pre existing PA htn, sleep study? Re-echo in 48mnths  RENAL A:   DI with elevated urine output hypokalemia P:   Monitor BMP / UOP  Dc IVFs Dc foley    NEUROLOGIC/ Endocrine A:   Suprasellar Mass Resection (5/25) Diabetes Insipidus P:   Continue Keppra  DDAVP 0.05 mg BID (followed by Dr. Buddy Duty), Increase ddAVP - 0.1 tid -d/w pharmacy Taper Decadron rapidly   Summary - once UO decreases, OK to discharge , with FU with dr Buddy Duty  Kara Mead MD. [facility]. [place] [facility] Pager [phone] If no response call [phone]

## 2014-11-27 NOTE — Progress Notes (Addendum)
MEDICATION RELATED CONSULT NOTE - INITIAL   Pharmacy Consult for DDAVP  Indication: Central Diabetes Insipitus  No Known Allergies  Patient Measurements: Height: 5\' 3"  (160 cm) Weight: 257 lb 1.6 oz (116.62 kg) IBW/kg (Calculated) : 52.4  Vital Signs: Temp: 98.1 F (36.7 C) (06/21 0346) Temp Source: Oral (06/21 0346) BP: 116/81 mmHg (06/21 0346) Pulse Rate: 108 (06/21 0346) Intake/Output from previous day: 06/20 0701 - 06/21 0700 In: 3285.4 [P.O.:1310; I.V.:1975.4] Out: 8100 [Urine:8100] Intake/Output from this shift:    Labs:  Recent Labs  11/25/14 0534 11/26/14 0316 11/26/14 1325 11/27/14 0108  WBC 8.7 9.4  --  11.3*  HGB 10.7* 11.1*  --  11.1*  HCT 33.9* 35.2*  --  34.5*  PLT 289 330  --  375  CREATININE 0.96 0.82 0.85 0.78   Estimated Creatinine Clearance: 121 mL/min (by C-G formula based on Cr of 0.78).  Medical History: Past Medical History  Diagnosis Date  . Vision abnormalities   . Headache(784.0)   . PCOS (polycystic ovarian syndrome)   . Fibroids   . Astigmatism   . Asthma     as child   . Pneumonia     3-4 yrs ago     Medications:  Scheduled:  . desmopressin  0.1 mg Oral TID  . [START ON 12/01/2014] dexamethasone  0.5 mg Oral Daily  . [START ON 11/29/2014] dexamethasone  1 mg Oral Daily  . dexamethasone  2 mg Oral Daily  . fluticasone  2 spray Each Nare Daily  . levETIRAcetam  500 mg Oral BID  . potassium chloride  40 mEq Oral Once  . Rivaroxaban  15 mg Oral BID WC  . senna-docusate  2 tablet Oral QHS    Assessment: 34 yo F admitted 11/23/2014 with syncope, tachycardia and hypotension.Recently started on oral desmopressin 0.05 bid prior to admission by Dr. Buddy Duty, patient picked up Rx but had not yet started therapy as an outpatient.  Pharmacy consulted regarding desmopressin titration and discharge medication cost.  Endo: Central DI, goals of therapy, eliminate nocturnal enuresis, < 3L of urine output daily, urine Osmolarity > 300 mOsm/kg,  while maintaining a normal serum sodium.   Na  141, 8100 ml total urine output in las t24h, &  Urine Osm 220 (6/20),  Patient with foley in place, urine appears very pale yellow.  IV fluids were stopped 6/2.  Patient remains quite thirsty and was drinking water and eating ice during our entire conversation  ON: Desmopressin 0.05mg  BID 6/17>6/20 titrated to 0.05 tid> 6/21 titrated to 0.1 mg TID Titration based primarily on symptomatic response to therapy and can be increased by 0.1 mg per dose.  (According to this trial evaluating the sublingual tablet at a 60 mcg dose ~ 100 mcg :: https://www.jstage.jst.go.jp/article/endocrj/[email or web address]/9/[email or web address]/_pdf)  Also note that while nasal spray has a higher potency, it is less preferred by patient and has a higher incidence of hyponatremia.  Spoke at length about DI with Ms. Aretha Parrot.  Prior to admission she was having to pee every 30 minutes and was unable to sleep. She relates that Dr. Buddy Duty prescribed the DDAVP at a fixed "low" dose to see how it would work.  Educated her about the signs of hyponatremia, she verbalized understanding.  Patient paid 4$ for the tablets, and case management is currently looking into how much the nasal spray would cost.    Goal of Therapy:  Eliminate nocturnal enuresis, < 3L of urine output daily, urine Osmolarity > 300 mOsm/kg, while maintaining  a normal serum sodium.    Plan:  Continue DDAVP dose at 0.1 mg TID, anticipate further titration will be necessary  Titrate based on UOP, Serum Na as well as symptoms  Thank you for allowing pharmacy to be a part of this patients care team.  Rowe Robert Pharm.D., BCPS, AQ-Cardiology Clinical Pharmacist 11/27/2014 10:46 AM Pager: [phone] Phone: [phone]

## 2014-11-28 LAB — OSMOLALITY, URINE: Osmolality, Ur: 224 mOsm/kg — ABNORMAL LOW (ref 390–1090)

## 2014-11-28 LAB — CBC
HCT: 35.1 % — ABNORMAL LOW (ref 36.0–46.0)
HEMOGLOBIN: 11.5 g/dL — AB (ref 12.0–15.0)
MCH: 28.7 pg (ref 26.0–34.0)
MCHC: 32.8 g/dL (ref 30.0–36.0)
MCV: 87.5 fL (ref 78.0–100.0)
PLATELETS: 410 10*3/uL — AB (ref 150–400)
RBC: 4.01 MIL/uL (ref 3.87–5.11)
RDW: 19.6 % — ABNORMAL HIGH (ref 11.5–15.5)
WBC: 9.8 10*3/uL (ref 4.0–10.5)

## 2014-11-28 LAB — BASIC METABOLIC PANEL
ANION GAP: 11 (ref 5–15)
BUN: 11 mg/dL (ref 6–20)
CHLORIDE: 105 mmol/L (ref 101–111)
CO2: 24 mmol/L (ref 22–32)
Calcium: 8.6 mg/dL — ABNORMAL LOW (ref 8.9–10.3)
Creatinine, Ser: 0.89 mg/dL (ref 0.44–1.00)
GFR calc non Af Amer: >60 mL/min (ref >60)
Glucose, Bld: 86 mg/dL (ref 65–99)
POTASSIUM: 3.7 mmol/L (ref 3.5–5.1)
SODIUM: 140 mmol/L (ref 135–145)

## 2014-11-28 MED ORDER — RIVAROXABAN (XARELTO) VTE STARTER PACK (15 & 20 MG): ORAL_TABLET | ORAL | Status: DC

## 2014-11-28 MED ORDER — RIVAROXABAN 20 MG PO TABS: 20.0000 mg | ORAL_TABLET | Freq: Every day | ORAL | Status: DC

## 2014-11-28 MED ORDER — DESMOPRESSIN ACETATE 0.1 MG PO TABS: 0.1000 mg | ORAL_TABLET | Freq: Three times a day (TID) | ORAL | Status: DC

## 2014-11-28 MED ORDER — DESMOPRESSIN ACETATE 0.2 MG PO TABS
0.2000 mg | ORAL_TABLET | Freq: Three times a day (TID) | ORAL | Status: DC
  Administered 2014-11-28 – 2014-11-29 (×2): 0.2 mg via ORAL
  Filled 2014-11-28 (×5): qty 1

## 2014-11-28 MED ORDER — ACETAMINOPHEN 325 MG PO TABS
650.0000 mg | ORAL_TABLET | Freq: Four times a day (QID) | ORAL | Status: DC | PRN
  Administered 2014-11-28 – 2014-11-29 (×3): 650 mg via ORAL
  Filled 2014-11-28 (×3): qty 2

## 2014-11-28 NOTE — Discharge Summary (Signed)
Physician Discharge Summary       Patient ID: Amanda Murphy MRN: [id number] DOB/AGE: 14-Mar-1981 34 y.o.  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 Found to be hypotensive with lactic acidosis and PCCM consulted for ICU admit. 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 She received several doses of DDAVP during admission. Na range was 141-145. She was treated empirically for seizures with Keppra and has been compliant with mediations. Prior to hospitalization, she had taken a trip to [place] and developed LLE posterior calf pain. The patient reported excessive thirst since discharge and was planned to follow up with Dr. Wylene Simmer 6/17. She went to her scheduled appointment and the office and been re-located. She then found the second location but at that time felt her heart racing and she was very short of breath. She remembers reaching for the wall but then passed out. Her mother indicates she was altered for approximately 5 minutes but it took 10 for her to be completely clear. ER evaluation noted her to be pale, diaphoretic, tachycardic and a CBG of 91. She also had an EKG with diffuse ST depression. Initially, she was documented to be lethargic but responsive. The patient was treated with volume resuscitation and stress steroids. ABG 7.440 / 23 / 70 / 15.7, Na 142, K 3.7, sr cr 1.38 (up from 0.73), troponin 0.03, glucose of 145 and lactate 5.83. The patients  mental status improved in ER as well as BP responded to volume. PCCM consulted for evaluation.She was volume resuscitated w/ good responsiveness to fluids. CT angio was obtained and indeed showed large bilateral pulmonary emboli with evidence if right heart strain by RV/LV ration of 2.1. Neurosurgery was called and consented to IV anticoagulation. She was placed on IV heparin and admitted to the intensive care. Both systemic and localized TPA were contraindicated due to recent neuro-surgery. Lower extremity dopplers were obtained and these did indeed show evidence of subacute DVT in the left leg occupying: left popliteal, left posterior tibial and left peroneal vein. She was transitioned to xarelto on 6/20. Only other issues have been high urine output and evidence of diabetes insipidus for which her ddavp was adjsuted on 6/21. At time of discharge her breathing has improved and she had only used the restroom from 5 pm the night prior at time of am rounds on 6/22 three times. She is now ready for discharge with the following plan of care.    Discharge Plan by active problems   Submassive bilateral Pulmonary emboli w/ right heart strain Left lower extremity DVT Plan Home on xarelto F/u our office July 19 at 215 pm  W/ NP Parrett, then with Dr Elsworth Soho 3-6 months  Will need at least 6-12 mo rx anticoagulation Repeat echo 6 months Consider sleep study in out-pt setting  Needs to avoid hormonal  Contraceptives.  Diabetes insipidus S/p recent suprasellar mass resection  Plan   Stay at ddavp 0.2 tid - If polyuria persists, she ahs FU appt with dr Buddy Duty on 6/30  Significant Hospital tests/ studies   Consults: neurosurgery and pharmacy  6/17 CTA of Chest >> large bilateral central PE, R heart strain with RV/LV ratio of 2.1 6/17 CT of Head >> R craniotomy for suprasellar craniopharyngioma resection, 4.5 cm region of low density in the insula on R c/w postoperative infarction  6/17 ECHO >> LV nml  size, septal flattening c/w RV overload, LVEF 65-70%, mild RV dilation, PA peak 67 6/18 LE Doppler >> subacute DVT in L popliteal vein, L posterior tibial vein, L peroneal vein   Discharge Exam: BP 119/90 mmHg  Pulse 95  Temp(Src) 98.3 F (36.8 C) (Oral)  Resp 18  Ht 5\' 3"  (1.6 m)  Wt 114.533 kg (252 lb 8 oz)  BMI 44.74 kg/m2  SpO2 94%  LMP 10/02/2014 (Exact Date)  General: Obese female in NAD, sitting in bed Neuro: AAOx4, speech clear, MAE HEENT: jvd down Cardiovascular: s1s2 rrr Lungs: clear  Abdomen: Obese/soft, NTND Musculoskeletal: No acute deformities, [place] + LLE Skin: No rash  Labs at discharge Lab Results  Component Value Date   CREATININE 0.95 11/29/2014   BUN 15 11/29/2014   NA 139 11/29/2014   K 4.2 11/29/2014   CL 107 11/29/2014   CO2 22 11/29/2014   Lab Results  Component Value Date   WBC 8.0 11/29/2014   HGB 13.4 11/29/2014   HCT 41.0 11/29/2014   MCV 88.0 11/29/2014   PLT 485* 11/29/2014   Lab Results  Component Value Date   ALT 79* 11/23/2014   AST 122* 11/23/2014   ALKPHOS 88 11/23/2014   BILITOT 0.7 11/23/2014   No results found for: INR, PROTIME  Current radiology studies No results found.  Disposition:  01-Home or Self Care      Discharge Instructions    Diet - low sodium heart healthy    Complete by:  As directed      Discharge instructions    Complete by:  As directed   Take to DDAVP three times a day     Increase activity slowly    Complete by:  As directed             Medication List    STOP taking these medications        etonogestrel-ethinyl estradiol 0.12-0.015 MG/24HR vaginal ring  Commonly known as:  NUVARING     naproxen sodium 220 MG tablet  Commonly known as:  ANAPROX     oxyCODONE-acetaminophen 5-325 MG per tablet  Commonly known as:  PERCOCET/ROXICET      TAKE these medications        acetaminophen 500 MG tablet  Commonly known as:  TYLENOL  Take 1,000 mg by mouth every 6 (six) hours  as needed for mild pain.     ALPRAZolam 0.5 MG tablet  Commonly known as:  XANAX  Take 1 tablet (0.5 mg total) by mouth at bedtime as needed for anxiety.     calcium carbonate 500 MG chewable tablet  Commonly known as:  TUMS - dosed in mg elemental calcium  Chew 1 tablet by mouth daily.     desmopressin 0.1 MG tablet  Commonly known as:  DDAVP  Take 1 tablet (0.1 mg total) by mouth 3 (three) times daily. May increase to 0.2mg  tid for urinary frequency and polyuria     docusate sodium 100 MG capsule  Commonly known as:  COLACE  Take 100 mg by mouth 2 (two) times daily as needed for mild constipation.     fluticasone  50 MCG/ACT nasal spray  Commonly known as:  FLONASE  Place 2 sprays into both nostrils daily.     levETIRAcetam 500 MG 24 hr tablet  Commonly known as:  KEPPRA XR  Take 1 tablet (500 mg total) by mouth 2 (two) times daily. 1 tablet twice a day for 3 weeks, then 1 tablet daily for 2 weeks, then discontinue     levocetirizine 5 MG tablet  Commonly known as:  XYZAL  Take 1 tablet (5 mg total) by mouth every evening.     multivitamin with minerals Tabs tablet  Take 1 tablet by mouth daily.     Rivaroxaban 15 & 20 MG Tbpk  Commonly known as:  XARELTO STARTER PACK  Take as directed on package: Start with one 15mg  tablet by mouth twice a day with food. On Day 22, switch to one 20mg  tablet once a day with food.     rivaroxaban 20 MG Tabs tablet  Commonly known as:  XARELTO  Take 1 tablet (20 mg total) by mouth daily with supper. Start AFTER starter pack completed     VITAMIN B 12 PO  Take 1 tablet by mouth daily.       Follow-up Information    Follow up with PARRETT,TAMMY, NP On 12/25/2014.   Specialty:  Nurse Practitioner   Why:  215pm.    Contact information:   [address] [address] [phone]       Discharged Condition: good  Physician Statement:   The Patient was personally examined, the discharge assessment and plan has been  personally reviewed and I agree with ACNP Ellaina Schuler's assessment and plan. > 30 minutes of time have been dedicated to discharge assessment, planning and discharge instructions.   Signed: Alva Broxson,PETE 11/29/2014, 11:33 AM

## 2014-11-28 NOTE — Progress Notes (Signed)
PULMONARY / CRITICAL CARE MEDICINE   Name: Amanda Murphy MRN: [id number] DOB: 05/26/1981    ADMISSION DATE:  11/23/2014 CONSULTATION DATE:  11/23/14  REFERRING MD :  Dr. Ralene Bathe / EDP    CHIEF COMPLAINT:  Syncopal episode, tachycardia, hypotension    INITIAL PRESENTATION: 34 y/o F with recent hx (5/25-5/31) of a R suprasellar cyst tumor resection complicated by DI who presented to [facility] on 6/17 after a syncopal episode.  Found to be hypotensive with lactic acidosis and PCCM consulted for ICU admit.     STUDIES: 6/17  CTA of Chest >> large bilateral central PE, R heart strain with RV/LV ratio of 2.1 6/17  CT of Head >> R craniotomy for suprasellar craniopharyngioma resection, 4.5 cm region of low density in the insula on R c/w postoperative infarction  6/17  ECHO >> LV nml size, septal flattening c/w RV overload, LVEF 65-70%, mild RV dilation, PA peak 67 6/18  LE Doppler >> subacute DVT in L popliteal vein, L posterior tibial vein, L peroneal vein    SIGNIFICANT EVENTS: 5/25 - 5/31  Admit for R suprasellar cyst tumor, hemianopsia.  Course complicated by DI 0/17  Admit with hypotension / syncope.  CTA positive for large bilateral PE.  6/18  Episodes of chest pain, LLE pain  6/19- high urine output  SUBJECTIVE:  Increased UO no CP, no SOb Anxiety about reimaging for clots  VITAL SIGNS: Temp:  [98.3 F (36.8 C)-99.3 F (37.4 C)] 98.7 F (37.1 C) (06/22 0411) Pulse Rate:  [90-105] 90 (06/22 0411) Resp:  [18] 18 (06/22 0411) BP: (113-119)/(58-80) 119/80 mmHg (06/22 0411) SpO2:  [96 %-98 %] 97 % (06/22 0411) Weight:  [115.259 kg (254 lb 1.6 oz)] 115.259 kg (254 lb 1.6 oz) (06/22 0411)   HEMODYNAMICS:     VENTILATOR SETTINGS:     INTAKE / OUTPUT:  Intake/Output Summary (Last 24 hours) at 11/28/14 1235 Last data filed at 11/28/14 1220  Gross per 24 hour  Intake    240 ml  Output   6902 ml  Net  -6662 ml    PHYSICAL EXAMINATION: General:  Obese female  in NAD, sitting in bed Neuro:  AAOx4, speech clear, MAE HEENT: jvd down Cardiovascular:  s1s2 rrr Lungs:  clear  Abdomen:  Obese/soft, NTND Musculoskeletal:  No acute deformities Skin:  No rash  LABS:  CBC  Recent Labs Lab 11/26/14 0316 11/27/14 0108 11/28/14 0402  WBC 9.4 11.3* 9.8  HGB 11.1* 11.1* 11.5*  HCT 35.2* 34.5* 35.1*  PLT 330 375 410*   Coag's No results for input(s): APTT, INR in the last 168 hours.   BMET  Recent Labs Lab 11/26/14 1325 11/27/14 0108 11/28/14 0402  NA 141 141 140  K 3.8 3.4* 3.7  CL 107 111 105  CO2 23 21* 24  BUN 9 10 11   CREATININE 0.85 0.78 0.89  GLUCOSE 97 131* 86   Electrolytes  Recent Labs Lab 11/26/14 1325 11/27/14 0108 11/28/14 0402  CALCIUM 8.8* 8.2* 8.6*     Sepsis Markers  Recent Labs Lab 11/23/14 1347 11/23/14 1538 11/25/14 1208  LATICACIDVEN 5.83* 4.6* 1.4  PROCALCITON  --  <0.10  --      ABG  Recent Labs Lab 11/23/14 1348  PHART 7.440  PCO2ART 23.2*  PO2ART 70.0*   Liver Enzymes  Recent Labs Lab 11/23/14 1339  AST 122*  ALT 79*  ALKPHOS 88  BILITOT 0.7  ALBUMIN 2.6*     Cardiac Enzymes  Recent Labs Lab 11/25/14 0040 11/25/14 0534 11/25/14 1208  TROPONINI 0.34* 0.29* 0.22*     Glucose  Recent Labs Lab 11/23/14 1344 11/23/14 1621  GLUCAP 138* 71    Imaging No results found.   ASSESSMENT / PLAN:   Sub-Massive Bilateral PE  - RV Strain on ECHO -NOT a candidate for thrombolysis given recent NSGY P: Off o2 Will likely need minimum of 6 mo to 1 year therapy  ct Xarelto f/u arranged   Syncopal Episode - in setting of PE.  ECHO with mild RV dilation, systolic fxn mod-severely reduced, PA pressure 67 P:  Will need work up for pre existing PA htn, sleep study? Re-echo in 64mnths  DI s/p Suprasellar Mass Resection (5/25) P:   Monitor BMP / [place] ddavp ck uosmo in am  Continue Keppra  Will contact Dr Buddy Duty for other recs.

## 2014-11-28 NOTE — Progress Notes (Signed)
Filed Vitals:   11/27/14 1352 11/27/14 2025 11/28/14 0411 11/28/14 0507  BP: 117/69 113/58 119/80   Pulse: 105 100 90   Temp: 98.3 F (36.8 C) 99.3 F (37.4 C) 98.7 F (37.1 C)   TempSrc: Oral Oral Oral Oral  Resp:  18 18   Height:      Weight:   115.259 kg (254 lb 1.6 oz)   SpO2: 96% 98% 97%     CBC  Recent Labs  11/27/14 0108 11/28/14 0402  WBC 11.3* 9.8  HGB 11.1* 11.5*  HCT 34.5* 35.1*  PLT 375 410*   BMET  Recent Labs  11/27/14 0108 11/28/14 0402  NA 141 140  K 3.4* 3.7  CL 111 105  CO2 21* 24  GLUCOSE 131* 86  BUN 10 11  CREATININE 0.78 0.89  CALCIUM 8.2* 8.6*    Patient resting in bed, comfortable. Foley DC'd yesterday, patient continues to have high urine output, but electrolytes remained stable. DDAVP has been increased to 0.1 mg by mouth 3 times a day. Patient has moved up her outpatient follow-up appointment with Dr. Dagmar Hait to next week. I am also scheduled to see her in follow-up at the end of next week. Decadron discontinued by [facility] service.  Plan: Stable from a neurosurgical perspective. I will be out of town until Monday, June 27. Dr. Earle Gell is aware of the patient, but will only come to see the patient if contacted for neurosurgical issues, should they arise.  Hosie Spangle, MD 11/28/2014, 11:15 AM

## 2014-11-28 NOTE — Progress Notes (Signed)
[facility] Progress Note Patient Name: Amanda Murphy DOB: [birth year] MRN: [id number]   Date of Service  11/28/2014  HPI/Events of Note  Mild pain  eICU Interventions  Tylenol prn     Intervention Category Intermediate Interventions: Pain - evaluation and management  MCQUAID, DOUGLAS 11/28/2014, 5:26 PM

## 2014-11-28 NOTE — Progress Notes (Signed)
Pt ambulated 350ft in hallway with RN; pt c/o left calf/leg pain 7 out of 10 which was aching. MD notified and prn order received for pt. Pt stable during shift and reported off to incoming RN. Francis Gaines Amylee Lodato RN.

## 2014-11-28 NOTE — Consult Note (Signed)
   [facility] CM Inpatient Consult   11/28/2014  ANDREKA STUCKI October 05, 1980 [id number]   Came to visit patient on behalf of [employer] program for [employer] employees/dependents with [employer]. She was resting soundly upon arrival. Will come back at later time. Will make inpatient RNCM aware.   Marthenia Rolling, MSN-Ed, RN,BSN [facility] Liaison [phone]

## 2014-11-28 NOTE — Progress Notes (Signed)
Breathing ok No chest pain UO still 8L, but after foley removal @ 5pm, only 3 BR visits Stay at ddavp 0.1 tid - she ahs FU appt with dr Buddy Duty on 6/30 Will need pulm FU in 1 week & recheck BMET - ddAVP can cause hypona If polyuria persists, can titrate up to 0.2 tid or increase nightly dose if nocturia. - see detailed pharmacy note Can dc decadron since she only got 2 days- noneed to t aper Out pt sleep study  Rigoberto Noel MD

## 2014-11-29 LAB — BASIC METABOLIC PANEL
ANION GAP: 10 (ref 5–15)
BUN: 15 mg/dL (ref 6–20)
CO2: 22 mmol/L (ref 22–32)
Calcium: 9.2 mg/dL (ref 8.9–10.3)
Chloride: 107 mmol/L (ref 101–111)
Creatinine, Ser: 0.95 mg/dL (ref 0.44–1.00)
GFR calc non Af Amer: >60 mL/min (ref >60)
GLUCOSE: 89 mg/dL (ref 65–99)
Potassium: 4.2 mmol/L (ref 3.5–5.1)
SODIUM: 139 mmol/L (ref 135–145)

## 2014-11-29 LAB — CBC
HEMATOCRIT: 41 % (ref 36.0–46.0)
HEMOGLOBIN: 13.4 g/dL (ref 12.0–15.0)
MCH: 28.8 pg (ref 26.0–34.0)
MCHC: 32.7 g/dL (ref 30.0–36.0)
MCV: 88 fL (ref 78.0–100.0)
Platelets: 485 10*3/uL — ABNORMAL HIGH (ref 150–400)
RBC: 4.66 MIL/uL (ref 3.87–5.11)
RDW: 19.5 % — ABNORMAL HIGH (ref 11.5–15.5)
WBC: 8 10*3/uL (ref 4.0–10.5)

## 2014-11-29 LAB — OSMOLALITY, URINE: OSMOLALITY UR: 316 mosm/kg — AB (ref 390–1090)

## 2014-11-29 NOTE — Care Management Note (Addendum)
Case Management Note Marvetta Gibbons RN, BSN Unit 2W-Case Manager [phone]  Patient Details  Name: Amanda Murphy MRN: [id number] Date of Birth: [birth year]  Subjective/Objective:     Pt admitted with PE/DVT, hx of recent craniotomy               Action/Plan: PTA pt lived at home with family- has good support- anticipate return home when medically stable-   Expected Discharge Date:                  Expected Discharge Plan:  Home/Self Care  In-House Referral:     Discharge planning Services  CM Consult, Medication Assistance  Post Acute Care Choice:    Choice offered to:     DME Arranged:    DME Agency:     HH Arranged:    [place] Agency:     Status of Service:  Completed, signed off  Medicare Important Message Given:  No Date Medicare IM Given:    Medicare IM give by:    Date Additional Medicare IM Given:    Additional Medicare Important Message give by:     If discussed at [place] of Stay Meetings, dates discussed:  11/29/14  Additional Comments:  6/21/6- pt started on Xarelto- per benefits check- Per [facility] [place]:   Xarelto: Free with co-pay assistance or $25 w/out  No auth required on either/ prices valid at [employer] with pt at bedside regarding benefit coverage for Xarelto- 30 day free card along with copay assist card given to pt for use at discharge- no questions by pt- pt states that she does use [facility]- drug is in-stock there.   Dawayne Patricia, RN 11/29/2014, 2:57 PM

## 2014-11-29 NOTE — Progress Notes (Signed)
Pt discharge education and instructions completed with pt and mother at bedside. All voices understanding denying any questions. Pt IV and telemetry removed; pt discharge home with mother to transport her home. Pt to pick up electronically sent prescription from preferred pharmacy on file. Pt transported off unit via wheelchair with belongings and mother at side. Francis Gaines Madhavi Hamblen RN.

## 2014-11-29 NOTE — Progress Notes (Signed)
Utilization review completed.  

## 2014-12-25 ENCOUNTER — Ambulatory Visit (INDEPENDENT_AMBULATORY_CARE_PROVIDER_SITE_OTHER): Payer: 59 | Admitting: Adult Health

## 2014-12-25 ENCOUNTER — Encounter: Payer: Self-pay | Admitting: Adult Health

## 2014-12-25 VITALS — BP 108/70 | HR 111 | Temp 98.4°F | Ht 63.0 in | Wt 268.0 lb

## 2014-12-25 DIAGNOSIS — I2699 Other pulmonary embolism without acute cor pulmonale: Secondary | ICD-10-CM | POA: Diagnosis not present

## 2014-12-25 NOTE — Patient Instructions (Signed)
Continue on Xarelto .  Transition to Xarelto 20mg .daily as planned.  Report any signs of bleeding .  Follow up with Dr. Elsworth Soho in 3 months and As needed   Please contact office for sooner follow up if symptoms do not improve or worsen or seek emergency care

## 2014-12-31 ENCOUNTER — Ambulatory Visit: Payer: 59 | Admitting: Internal Medicine

## 2015-01-03 NOTE — Progress Notes (Signed)
Subjective:    Patient ID: Amanda Murphy, female    DOB: 07/08/1980, 34 y.o.   MRN: [id number]  HPI 34 yo female seen for initial pulmonary/CCM consult 11/23/14 for PE    12/25/14 [facility] follow up : PE  Pt returns for post hospital follow up  Admitted 6/17/-11/29/14 for submassive bilateral PE, right heart strain.  Recent admit for suprsellar mass resection (8/34/19)  complicated by DI who presented to [facility] on 6/17 after a syncopal episode. Found to be hypotensive with lactic acidosis and PCCM consulted for ICU admit.She was volume resuscitated w/ good responsiveness to fluids. CT angio was obtained and indeed showed large bilateral pulmonary emboli with evidence if right heart strain by RV/LV ration of 2.1. Neurosurgery was called and consented to IV anticoagulation. She was placed on IV heparin and admitted to the intensive care. Both systemic and localized TPA were contraindicated due to recent neuro-surgery. Lower extremity dopplers were obtained and these did indeed show evidence of subacute DVT in the left leg occupying: left popliteal, left posterior tibial and left peroneal vein. She was transitioned to xarelto on 6/20. Only other issues have been high urine output and evidence of diabetes insipidus for which her ddavp was adjsuted on 6/21.  Plan for at  least 6-12 mo rx anticoagulation Repeat echo 6 months Needs to avoid hormonal Contraceptives. Since discharge she is doing well.  Dyspnea has improved , some still if she lies flat.  She says she is doing well on Xarelto .  No bleeding .  Denies chest pain, increased leg swelling, n/vd/ .     Review of Systems Constitutional:   No  weight loss, night sweats,  Fevers, chills, fatigue, or  lassitude.  HEENT:   No headaches,  Difficulty swallowing,  Tooth/dental problems, or  Sore throat,                No sneezing, itching, ear ache, nasal congestion, post nasal drip,   CV:  No chest pain,  Orthopnea,  PND, swelling in lower extremities, anasarca, dizziness, palpitations, syncope.   GI  No heartburn, indigestion, abdominal pain, nausea, vomiting, diarrhea, change in bowel habits, loss of appetite, bloody stools.   Resp: No shortness of breath with exertion or at rest.  No excess mucus, no productive cough,  No non-productive cough,  No coughing up of blood.  No change in color of mucus.  No wheezing.  No chest wall deformity  Skin: no rash or lesions.  GU: no dysuria, change in color of urine, no urgency or frequency.  No flank pain, no hematuria   MS:  No joint pain or swelling.  No decreased range of motion.  No back pain.  Psych:  No change in mood or affect. No depression or anxiety.  No memory loss.         Objective:   Physical Exam  GEN: A/Ox3; pleasant , NAD, well nourished   HEENT:  [place]/AT,  EACs-clear, TMs-wnl, NOSE-clear, THROAT-clear, no lesions, no postnasal drip or exudate noted.   NECK:  Supple w/ fair ROM; no JVD; normal carotid impulses w/o bruits; no thyromegaly or nodules palpated; no lymphadenopathy.  RESP  Clear  P & A; w/o, wheezes/ rales/ or rhonchi.no accessory muscle use, no dullness to percussion  CARD:  RRR, no m/r/g  , no peripheral edema, pulses intact, no cyanosis or clubbing.  GI:   Soft & nt; nml bowel sounds; no organomegaly or masses detected.  Musco:  Warm bil, no deformities or joint swelling noted.   Neuro: alert, no focal deficits noted.    Skin: Warm, no lesions or rashes        Assessment & Plan:

## 2015-01-03 NOTE — Assessment & Plan Note (Addendum)
Submassive bilateral PE, right heart strain.  Will need echo in 6 months   Plan   Continue on Xarelto .  Transition to Xarelto 20mg .daily as planned.  Report any signs of bleeding .  Follow up with Dr. Elsworth Soho in 3 months and As needed   Please contact office for sooner follow up if symptoms do not improve or worsen or seek emergency care

## 2015-01-07 ENCOUNTER — Encounter: Payer: Self-pay | Admitting: Internal Medicine

## 2015-01-07 ENCOUNTER — Ambulatory Visit (INDEPENDENT_AMBULATORY_CARE_PROVIDER_SITE_OTHER): Payer: 59 | Admitting: Internal Medicine

## 2015-01-07 VITALS — BP 110/86 | HR 94 | Temp 98.9°F | Resp 16 | Wt 272.0 lb

## 2015-01-07 DIAGNOSIS — I2699 Other pulmonary embolism without acute cor pulmonale: Secondary | ICD-10-CM

## 2015-01-07 DIAGNOSIS — G9389 Other specified disorders of brain: Secondary | ICD-10-CM

## 2015-01-07 DIAGNOSIS — R220 Localized swelling, mass and lump, head: Secondary | ICD-10-CM

## 2015-01-07 NOTE — Progress Notes (Signed)
Pre visit review using our clinic review tool, if applicable. No additional management support is needed unless otherwise documented below in the visit note. 

## 2015-01-07 NOTE — Patient Instructions (Signed)
We will see you back in about 5 months. Come back sooner or call us if you have any problems sooner.  With the xarelto it is important to be on the lookout for any bleeding or dark stools. If you have a cut or injury hold pressure to it for 10-15 minutes before trying to see if the bleeding has stopped. Do not take any extra aspirin or ibuprofen or motrin (naproxen) while on the xarelto as they can increase your risk for bleeding and stomach problems.

## 2015-01-08 NOTE — Assessment & Plan Note (Signed)
S/P surgery and struggling with now controlled diabetes insipidus. Taking DDAVP and limiting free water intake. Doing okay with recovery and due for MRI in the near future to make sure resection was complete.

## 2015-01-08 NOTE — Assessment & Plan Note (Signed)
Taking xarelto daily without bleeding or side effects. Have recommended full year of therapy depending on results of MRI in determining success of brain surgery. She was provoked with many factors including OCPs, long flight, surgery, morbid obesity. She will get repeat echo in 5 months to follow up. She did have signs of pre-existing pulmonary hypertension. Declines sleep study at this time. Talked to her about signs to watch for bleeding and when to call for help. Symptoms are abating and she is gradually getting back into exercise.

## 2015-01-08 NOTE — Progress Notes (Signed)
   Subjective:    Patient ID: Amanda Murphy, female    DOB: [birth year], 34 y.o.   MRN: [id number]  HPI The patient is a 34 YO female coming in for follow up of recent brain surgery (removed tumor, complicated by diabetes insipidus, now on treatment for that) and recent hospitalization for acute massive PE (provoked by recent flight to [place], surgery, OCPs, weight on xarelto and doing better). She has seen pulmonary for follow up and they recommend echo in about 6 months. Still some discomfort in her chest and minimal SOB. Starting to feel better and doing more around the house. She is starting to exercise a little again.   Review of Systems  Constitutional: Positive for activity change and fatigue. Negative for fever, appetite change and unexpected weight change.  Respiratory: Positive for shortness of breath. Negative for cough, chest tightness and wheezing.   Cardiovascular: Negative for chest pain, palpitations and leg swelling.  Gastrointestinal: Negative for nausea, abdominal pain, diarrhea, constipation and abdominal distention.  Neurological: Positive for weakness. Negative for dizziness, syncope, light-headedness and headaches.  Psychiatric/Behavioral: The patient is nervous/anxious.       Objective:   Physical Exam  Constitutional: She is oriented to person, place, and time. She appears well-developed and well-nourished.  Overweight  HENT:  Head: Normocephalic and atraumatic.  Eyes: EOM are normal.  Neck: Normal range of motion.  Cardiovascular: Normal rate and regular rhythm.   Pulmonary/Chest: Effort normal and breath sounds normal. No respiratory distress. She has no wheezes. She has no rales.  Abdominal: Soft. She exhibits no distension.  Neurological: She is alert and oriented to person, place, and time. Coordination normal.  Skin: Skin is warm and dry.   Filed Vitals:   01/07/15 1543  BP: 110/86  Pulse: 94  Temp: 98.9 F (37.2 C)  TempSrc: Oral  Resp: 16    Weight: 272 lb (123.378 kg)  SpO2: 98%      Assessment & Plan:

## 2015-02-21 ENCOUNTER — Telehealth: Payer: Self-pay | Admitting: Pulmonary Disease

## 2015-02-21 NOTE — Telephone Encounter (Signed)
Called and spoke to pt. Pt is to follow up with Dr. Elsworth Soho in October for PE, but there aren't any openings till December. Pt denies any SOB, CP/tightness, or abnormal bleeding. Pt still taking Xarelto as prescribed. Appt made with RA in December and pt advised to call back to check for cancellations. Pt verbalized understanding and denied any further questions or concerns at this time.   Will forward to Dr. Elsworth Soho as Juluis Rainier.

## 2015-05-15 ENCOUNTER — Encounter: Payer: Self-pay | Admitting: Pulmonary Disease

## 2015-05-15 ENCOUNTER — Ambulatory Visit (INDEPENDENT_AMBULATORY_CARE_PROVIDER_SITE_OTHER): Payer: 59 | Admitting: Pulmonary Disease

## 2015-05-15 VITALS — BP 110/82 | HR 81 | Ht 63.0 in | Wt 298.0 lb

## 2015-05-15 DIAGNOSIS — I2699 Other pulmonary embolism without acute cor pulmonale: Secondary | ICD-10-CM

## 2015-05-15 NOTE — Progress Notes (Signed)
   Subjective:    Patient ID: Amanda Murphy, female    DOB: 1980-07-18, 34 y.o.   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 Follow-up    Breathing is doing fine, having pain in left calf (same leg that she had Blood clot in before)    Since discharge she has done well.  Dyspnea has improved , some still if she lies flat.  Compliant with Xarelto . No bleeding .  Denies chest pain, increased leg swelling, n/vd/ .  Developed leg cramp 2 ds ago & pain persists Remains on desmopressin  She was on hormonal Contraceptives., Did not want to use an intrauterine device and is now only using condoms  Significant tests/ events  11/2014  CT angio was obtained and indeed showed large bilateral pulmonary emboli with evidence if right heart strain by RV/LV ration of 2.1.  Lower extremity dopplers - subacute DVT in the left leg occupying: left popliteal, left posterior tibial and left peroneal vein.    Review of Systems neg for any significant sore throat, dysphagia, itching, sneezing, nasal congestion or excess/ purulent secretions, fever, chills, sweats, unintended wt loss, pleuritic or exertional cp, hempoptysis, orthopnea pnd or change in chronic leg swelling. Also denies presyncope, palpitations, heartburn, abdominal pain, nausea, vomiting, diarrhea or change in bowel or urinary habits, dysuria,hematuria, rash, arthralgias, visual complaints, headache, numbness weakness or ataxia.     Objective:   Physical Exam  Gen. Pleasant, obese, in no distress ENT - no lesions, no post nasal drip Neck: No JVD, no thyromegaly, no carotid bruits Lungs: no use of accessory muscles, no dullness to percussion, decreased without rales or rhonchi    Cardiovascular: Rhythm regular, heart sounds  normal, no murmurs or gallops, no peripheral edema Musculoskeletal: No deformities, no cyanosis or clubbing , no tremors       Assessment & Plan:

## 2015-05-15 NOTE — Assessment & Plan Note (Signed)
Echo to see if pressures improved Call if leg cramp persists into next week & we can schedule duplex test  Anticoagulation x 1 year - 6/207 since surgical risk factor

## 2015-05-15 NOTE — Patient Instructions (Signed)
Echo for heart pressures Call if leg cramp persists into next week & we can schedule duplex test

## 2015-05-22 ENCOUNTER — Other Ambulatory Visit: Payer: Self-pay | Admitting: Neurosurgery

## 2015-05-22 DIAGNOSIS — D444 Neoplasm of uncertain behavior of craniopharyngeal duct: Secondary | ICD-10-CM

## 2015-05-29 ENCOUNTER — Ambulatory Visit (HOSPITAL_COMMUNITY): Payer: 59 | Attending: Cardiology

## 2015-05-29 ENCOUNTER — Other Ambulatory Visit: Payer: Self-pay

## 2015-05-29 DIAGNOSIS — I2699 Other pulmonary embolism without acute cor pulmonale: Secondary | ICD-10-CM | POA: Insufficient documentation

## 2015-05-31 ENCOUNTER — Ambulatory Visit
Admission: RE | Admit: 2015-05-31 | Discharge: 2015-05-31 | Disposition: A | Discharge status: Home / Self Care | Payer: 59 | Source: Ambulatory Visit | Attending: Neurosurgery | Admitting: Neurosurgery

## 2015-05-31 DIAGNOSIS — D444 Neoplasm of uncertain behavior of craniopharyngeal duct: Secondary | ICD-10-CM

## 2015-05-31 MED ORDER — GADOBENATE DIMEGLUMINE 529 MG/ML IV SOLN
10.0000 mL | Freq: Once | INTRAVENOUS | Status: AC | PRN
  Administered 2015-05-31: 10 mL via INTRAVENOUS

## 2015-07-29 DIAGNOSIS — E559 Vitamin D deficiency, unspecified: Secondary | ICD-10-CM | POA: Diagnosis not present

## 2015-07-31 ENCOUNTER — Ambulatory Visit (INDEPENDENT_AMBULATORY_CARE_PROVIDER_SITE_OTHER): Payer: 59 | Admitting: Internal Medicine

## 2015-07-31 ENCOUNTER — Telehealth: Payer: Self-pay | Admitting: Pulmonary Disease

## 2015-07-31 ENCOUNTER — Encounter: Payer: Self-pay | Admitting: Internal Medicine

## 2015-07-31 VITALS — BP 122/86 | HR 97 | Temp 98.5°F | Resp 16 | Ht 63.0 in | Wt 307.8 lb

## 2015-07-31 DIAGNOSIS — J020 Streptococcal pharyngitis: Secondary | ICD-10-CM

## 2015-07-31 MED ORDER — FLUCONAZOLE 150 MG PO TABS: 150.0000 mg | ORAL_TABLET | Freq: Once | ORAL | Status: DC

## 2015-07-31 MED ORDER — AMOXICILLIN 500 MG PO CAPS: 500.0000 mg | ORAL_CAPSULE | Freq: Three times a day (TID) | ORAL | Status: DC

## 2015-07-31 MED ORDER — RIVAROXABAN 20 MG PO TABS: 20.0000 mg | ORAL_TABLET | Freq: Every day | ORAL | Status: DC

## 2015-07-31 MED FILL — AMOXICILLIN 500 MG CAPSULE: 500 | 7 days supply | Qty: 21 | Fill #0

## 2015-07-31 MED FILL — XARELTO 20 MG TABLET: 20 | 30 days supply | Qty: 30 | Fill #0

## 2015-07-31 MED FILL — DESMOPRESSIN ACETATE 0.1 MG: 0.1 | 45 days supply | Qty: 180 | Fill #3

## 2015-07-31 NOTE — Assessment & Plan Note (Signed)
Meets centor criteria for treatment without swab. Amoxicillin for 1 week. Fluconazole in case of yeast infection.

## 2015-07-31 NOTE — Telephone Encounter (Signed)
Called and spoke to pt. Pt is requesting refill of Xarelto. Rx sent to preferred pharmacy. Pt verbalized understanding and denied any further questions or concerns at this time.

## 2015-07-31 NOTE — Progress Notes (Signed)
   Subjective:    Patient ID: Amanda Murphy, female    DOB: December 06, 1980, 35 y.o.   MRN: [id number]  HPI The patient is a 35 YO female coming in for sore throat. She has had fever yesterday. 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 Had flu shot. Denies muscle aches. No sinus drainage. No cough or SOB. Some neck swelling and pain. Still has her tonsils. Started 2 days ago and overall worsening.   Review of Systems  Constitutional: Positive for fever. Negative for chills, activity change, appetite change and fatigue.  HENT: Positive for sore throat. Negative for congestion, ear discharge, ear pain, rhinorrhea, sinus pressure and trouble swallowing.   Eyes: Negative.   Respiratory: Negative.   Cardiovascular: Negative.       Objective:   Physical Exam  Constitutional: She appears well-developed and well-nourished.  HENT:  Head: Normocephalic and atraumatic.  Right Ear: External ear normal.  Left Ear: External ear normal.  Tonsils with white exudate  Eyes: EOM are normal.  Neck: Normal range of motion.  Left neck CAD  Cardiovascular: Normal rate and regular rhythm.   Pulmonary/Chest: Effort normal and breath sounds normal. No respiratory distress. She has no wheezes. She has no rales.  Lymphadenopathy:    She has cervical adenopathy.   Filed Vitals:   07/31/15 0824  BP: 122/86  Pulse: 97  Temp: 98.5 F (36.9 C)  TempSrc: Oral  Resp: 16  Height: 5\' 3"  (1.6 m)  Weight: 307 lb 12.8 oz (139.617 kg)  SpO2: 98%     Assessment & Plan:

## 2015-07-31 NOTE — Progress Notes (Signed)
Pre visit review using our clinic review tool, if applicable. No additional management support is needed unless otherwise documented below in the visit note. 

## 2015-07-31 NOTE — Patient Instructions (Signed)
We have sent in amoxicillin for the tonsils, take 1 pill three times per day for 1 week.   For the birth control there is a progesterone only pill that should be safe for you to talk to your ob/gyn about taking.

## 2015-08-01 MED FILL — VIT D2 1.25 MG (50,000 UNIT: 1.25 MG | 84 days supply | Qty: 12 | Fill #0

## 2015-09-10 ENCOUNTER — Other Ambulatory Visit: Payer: Self-pay | Admitting: Internal Medicine

## 2015-09-10 MED FILL — XARELTO 20 MG TABLET: 20 | 30 days supply | Qty: 30 | Fill #1

## 2015-09-27 ENCOUNTER — Encounter: Payer: Self-pay | Admitting: Internal Medicine

## 2015-09-27 ENCOUNTER — Ambulatory Visit (INDEPENDENT_AMBULATORY_CARE_PROVIDER_SITE_OTHER): Payer: 59 | Admitting: Internal Medicine

## 2015-09-27 ENCOUNTER — Other Ambulatory Visit (INDEPENDENT_AMBULATORY_CARE_PROVIDER_SITE_OTHER): Payer: 59

## 2015-09-27 VITALS — BP 114/80 | HR 88 | Temp 98.4°F | Resp 16 | Ht 63.0 in | Wt 314.1 lb

## 2015-09-27 DIAGNOSIS — I2699 Other pulmonary embolism without acute cor pulmonale: Secondary | ICD-10-CM

## 2015-09-27 DIAGNOSIS — Z0000 Encounter for general adult medical examination without abnormal findings: Secondary | ICD-10-CM

## 2015-09-27 LAB — COMPREHENSIVE METABOLIC PANEL
ALBUMIN: 3.5 g/dL (ref 3.5–5.2)
ALK PHOS: 71 U/L (ref 39–117)
ALT: 33 U/L (ref 0–35)
AST: 22 U/L (ref 0–37)
BILIRUBIN TOTAL: 0.3 mg/dL (ref 0.2–1.2)
BUN: 9 mg/dL (ref 6–23)
CALCIUM: 9.1 mg/dL (ref 8.4–10.5)
CHLORIDE: 104 meq/L (ref 96–112)
CO2: 29 mEq/L (ref 19–32)
CREATININE: 0.8 mg/dL (ref 0.40–1.20)
GFR: 104.21 mL/min (ref >60.00)
Glucose, Bld: 89 mg/dL (ref 70–99)
Potassium: 3.9 mEq/L (ref 3.5–5.1)
Sodium: 140 mEq/L (ref 135–145)
TOTAL PROTEIN: 7.2 g/dL (ref 6.0–8.3)

## 2015-09-27 LAB — LIPID PANEL
CHOLESTEROL: 171 mg/dL (ref 0–200)
HDL: 66.7 mg/dL (ref >39.00)
LDL CALC: 82 mg/dL (ref 0–99)
NonHDL: 103.81
TRIGLYCERIDES: 108 mg/dL (ref 0.0–149.0)
Total CHOL/HDL Ratio: 3
VLDL: 21.6 mg/dL (ref 0.0–40.0)

## 2015-09-27 LAB — T4, FREE: FREE T4: 0.7 ng/dL (ref 0.60–1.60)

## 2015-09-27 LAB — CBC
HCT: 38.2 % (ref 36.0–46.0)
Hemoglobin: 12.5 g/dL (ref 12.0–15.0)
MCHC: 32.8 g/dL (ref 30.0–36.0)
MCV: 90.3 fl (ref 78.0–100.0)
Platelets: 401 10*3/uL — ABNORMAL HIGH (ref 150.0–400.0)
RBC: 4.23 Mil/uL (ref 3.87–5.11)
RDW: 16.4 % — AB (ref 11.5–15.5)
WBC: 8.1 10*3/uL (ref 4.0–10.5)

## 2015-09-27 LAB — HEMOGLOBIN A1C: HEMOGLOBIN A1C: 6.1 % (ref 4.6–6.5)

## 2015-09-27 MED ORDER — EXENATIDE ER 2 MG ~~LOC~~ PEN: 2.0000 mg | PEN_INJECTOR | SUBCUTANEOUS | Status: DC

## 2015-09-27 MED ORDER — LEVOCETIRIZINE DIHYDROCHLORIDE 5 MG PO TABS: 5.0000 mg | ORAL_TABLET | Freq: Every evening | ORAL | Status: DC

## 2015-09-27 MED FILL — BYDUREON 2 MG PEN INJECT: 2 | 28 days supply | Qty: 4 | Fill #0

## 2015-09-27 MED FILL — LEVOCETIRIZINE 5 MG TABLET: 5 | 30 days supply | Qty: 30 | Fill #0

## 2015-09-27 NOTE — Assessment & Plan Note (Signed)
Checking labs and adjust as needed. Working on weight loss with glp-1 and increasing her exercise. Pap smear up to date. Tetanus up to date. Given screening recommendations.

## 2015-09-27 NOTE — Assessment & Plan Note (Signed)
Coming up on 1 year and would need benefit and risk discussion given her ongoing risk of BMI although non-smoker and active.

## 2015-09-27 NOTE — Progress Notes (Signed)
   Subjective:    Patient ID: Amanda Murphy, female    DOB: 05-Feb-1981, 35 y.o.   MRN: [id number]  HPI The patient is a 35 YO female coming in for wellness. No new complaints or concerns. Having some increased bleeding from her periods the last 2-3 months on the xarelto.   PMH, [facility], social history reviewed and updated.   Review of Systems  Constitutional: Negative for fever, activity change, appetite change, fatigue and unexpected weight change.  HENT: Negative.   Eyes: Negative.   Respiratory: Negative for cough, chest tightness, shortness of breath and wheezing.   Cardiovascular: Negative for chest pain, palpitations and leg swelling.  Gastrointestinal: Negative for nausea, abdominal pain, diarrhea, constipation and abdominal distention.  Musculoskeletal: Negative.   Skin: Negative.   Neurological: Negative for dizziness, syncope, weakness, light-headedness and headaches.  Psychiatric/Behavioral: Negative for hallucinations, dysphoric mood and decreased concentration.      Objective:   Physical Exam  Constitutional: She is oriented to person, place, and time. She appears well-developed and well-nourished.  Obese  HENT:  Head: Normocephalic and atraumatic.  Eyes: EOM are normal.  Neck: Normal range of motion.  Cardiovascular: Normal rate and regular rhythm.   Pulmonary/Chest: Effort normal and breath sounds normal. No respiratory distress. She has no wheezes. She has no rales.  Abdominal: Soft. Bowel sounds are normal. She exhibits no distension. There is no tenderness. There is no rebound.  Musculoskeletal: She exhibits no edema.  Neurological: She is alert and oriented to person, place, and time.  Skin: Skin is warm and dry.  Psychiatric: She has a normal mood and affect.   Filed Vitals:   09/27/15 0934  BP: 114/80  Pulse: 88  Temp: 98.4 F (36.9 C)  TempSrc: Oral  Resp: 16  Height: 5\' 3"  (1.6 m)  Weight: 314 lb 1.9 oz (142.484 kg)  SpO2: 97%      Assessment &  Plan:

## 2015-09-27 NOTE — Assessment & Plan Note (Signed)
Working on losing weight but surgery last year and the recovery set her back. Trying glp-1 for 3 months for efficacy.

## 2015-09-27 NOTE — Patient Instructions (Signed)
We have sent in bydureon to take for the weight. Use it weekly and it should help.   We will see you back in about 3 months and check the labs today.  Health Maintenance, Female Adopting a healthy lifestyle and getting preventive care can go a long way to promote health and wellness. Talk with your health care provider about what schedule of regular examinations is right for you. This is a good chance for you to check in with your provider about disease prevention and staying healthy. In between checkups, there are plenty of things you can do on your own. Experts have done a lot of research about which lifestyle changes and preventive measures are most likely to keep you healthy. Ask your health care provider for more information. WEIGHT AND DIET  Eat a healthy diet  Be sure to include plenty of vegetables, fruits, low-fat dairy products, and lean protein.  Do not eat a lot of foods high in solid fats, added sugars, or salt.  Get regular exercise. This is one of the most important things you can do for your health.  Most adults should exercise for at least 150 minutes each week. The exercise should increase your heart rate and make you sweat (moderate-intensity exercise).  Most adults should also do strengthening exercises at least twice a week. This is in addition to the moderate-intensity exercise.  Maintain a healthy weight  Body mass index (BMI) is a measurement that can be used to identify possible weight problems. It estimates body fat based on height and weight. Your health care provider can help determine your BMI and help you achieve or maintain a healthy weight.  For females 100 years of age and older:   A BMI below 18.5 is considered underweight.  A BMI of 18.5 to 24.9 is normal.  A BMI of 25 to 29.9 is considered overweight.  A BMI of 30 and above is considered obese.  Watch levels of cholesterol and blood lipids  You should start having your blood tested for lipids  and cholesterol at 36 years of age, then have this test every 5 years.  You may need to have your cholesterol levels checked more often if:  Your lipid or cholesterol levels are high.  You are older than 36 years of age.  You are at high risk for heart disease.  CANCER SCREENING   Lung Cancer  Lung cancer screening is recommended for adults 69-43 years old who are at high risk for lung cancer because of a history of smoking.  A yearly low-dose CT scan of the lungs is recommended for people who:  Currently smoke.  Have quit within the past 15 years.  Have at least a 30-pack-year history of smoking. A pack year is smoking an average of one pack of cigarettes a day for 1 year.  Yearly screening should continue until it has been 15 years since you quit.  Yearly screening should stop if you develop a health problem that would prevent you from having lung cancer treatment.  Breast Cancer  Practice breast self-awareness. This means understanding how your breasts normally appear and feel.  It also means doing regular breast self-exams. Let your health care provider know about any changes, no matter how small.  If you are in your 20s or 30s, you should have a clinical breast exam (CBE) by a health care provider every 1-3 years as part of a regular health exam.  If you are 19 or older, have a  CBE every year. Also consider having a breast X-ray (mammogram) every year.  If you have a family history of breast cancer, talk to your health care provider about genetic screening.  If you are at high risk for breast cancer, talk to your health care provider about having an MRI and a mammogram every year.  Breast cancer gene (BRCA) assessment is recommended for women who have family members with BRCA-related cancers. BRCA-related cancers include:  Breast.  Ovarian.  Tubal.  Peritoneal cancers.  Results of the assessment will determine the need for genetic counseling and BRCA1 and  BRCA2 testing. Cervical Cancer Your health care provider may recommend that you be screened regularly for cancer of the pelvic organs (ovaries, uterus, and vagina). This screening involves a pelvic examination, including checking for microscopic changes to the surface of your cervix (Pap test). You may be encouraged to have this screening done every 3 years, beginning at age 83.  For women ages 71-65, health care providers may recommend pelvic exams and Pap testing every 3 years, or they may recommend the Pap and pelvic exam, combined with testing for human papilloma virus (HPV), every 5 years. Some types of HPV increase your risk of cervical cancer. Testing for HPV may also be done on women of any age with unclear Pap test results.  Other health care providers may not recommend any screening for nonpregnant women who are considered low risk for pelvic cancer and who do not have symptoms. Ask your health care provider if a screening pelvic exam is right for you.  If you have had past treatment for cervical cancer or a condition that could lead to cancer, you need Pap tests and screening for cancer for at least 20 years after your treatment. If Pap tests have been discontinued, your risk factors (such as having a new sexual partner) need to be reassessed to determine if screening should resume. Some women have medical problems that increase the chance of getting cervical cancer. In these cases, your health care provider may recommend more frequent screening and Pap tests. Colorectal Cancer  This type of cancer can be detected and often prevented.  Routine colorectal cancer screening usually begins at 36 years of age and continues through 36 years of age.  Your health care provider may recommend screening at an earlier age if you have risk factors for colon cancer.  Your health care provider may also recommend using home test kits to check for hidden blood in the stool.  A small camera at the end of  a tube can be used to examine your colon directly (sigmoidoscopy or colonoscopy). This is done to check for the earliest forms of colorectal cancer.  Routine screening usually begins at age 36.  Direct examination of the colon should be repeated every 5-10 years through 36 years of age. However, you may need to be screened more often if early forms of precancerous polyps or small growths are found. Skin Cancer  Check your skin from head to toe regularly.  Tell your health care provider about any new moles or changes in moles, especially if there is a change in a mole's shape or color.  Also tell your health care provider if you have a mole that is larger than the size of a pencil eraser.  Always use sunscreen. Apply sunscreen liberally and repeatedly throughout the day.  Protect yourself by wearing long sleeves, pants, a wide-brimmed hat, and sunglasses whenever you are outside. HEART DISEASE, DIABETES, AND HIGH BLOOD  PRESSURE   High blood pressure causes heart disease and increases the risk of stroke. High blood pressure is more likely to develop in:  People who have blood pressure in the high end of the normal range (130-139/85-89 mm Hg).  People who are overweight or obese.  People who are African American.  If you are 43-58 years of age, have your blood pressure checked every 3-5 years. If you are 9 years of age or older, have your blood pressure checked every year. You should have your blood pressure measured twice--once when you are at a hospital or clinic, and once when you are not at a hospital or clinic. Record the average of the two measurements. To check your blood pressure when you are not at a hospital or clinic, you can use:  An automated blood pressure machine at a pharmacy.  A home blood pressure monitor.  If you are between 43 years and 25 years old, ask your health care provider if you should take aspirin to prevent strokes.  Have regular diabetes screenings. This  involves taking a blood sample to check your fasting blood sugar level.  If you are at a normal weight and have a low risk for diabetes, have this test once every three years after 35 years of age.  If you are overweight and have a high risk for diabetes, consider being tested at a younger age or more often. PREVENTING INFECTION  Hepatitis B  If you have a higher risk for hepatitis B, you should be screened for this virus. You are considered at high risk for hepatitis B if:  You were born in a country where hepatitis B is common. Ask your health care provider which countries are considered high risk.  Your parents were born in a high-risk country, and you have not been immunized against hepatitis B (hepatitis B vaccine).  You have HIV or AIDS.  You use needles to inject street drugs.  You live with someone who has hepatitis B.  You have had sex with someone who has hepatitis B.  You get hemodialysis treatment.  You take certain medicines for conditions, including cancer, organ transplantation, and autoimmune conditions. Hepatitis C  Blood testing is recommended for:  Everyone born from [birth year] through 1966.  Anyone with known risk factors for hepatitis C. Sexually transmitted infections (STIs)  You should be screened for sexually transmitted infections (STIs) including gonorrhea and chlamydia if:  You are sexually active and are younger than 36 years of age.  You are older than 36 years of age and your health care provider tells you that you are at risk for this type of infection.  Your sexual activity has changed since you were last screened and you are at an increased risk for chlamydia or gonorrhea. Ask your health care provider if you are at risk.  If you do not have HIV, but are at risk, it may be recommended that you take a prescription medicine daily to prevent HIV infection. This is called pre-exposure prophylaxis (PrEP). You are considered at risk if:  You are  sexually active and do not regularly use condoms or know the HIV status of your partner(s).  You take drugs by injection.  You are sexually active with a partner who has HIV. Talk with your health care provider about whether you are at high risk of being infected with HIV. If you choose to begin PrEP, you should first be tested for HIV. You should then be tested every 3  months for as long as you are taking PrEP.  PREGNANCY   If you are premenopausal and you may become pregnant, ask your health care provider about preconception counseling.  If you may become pregnant, take 400 to 800 micrograms (mcg) of folic acid every day.  If you want to prevent pregnancy, talk to your health care provider about birth control (contraception). OSTEOPOROSIS AND MENOPAUSE   Osteoporosis is a disease in which the bones lose minerals and strength with aging. This can result in serious bone fractures. Your risk for osteoporosis can be identified using a bone density scan.  If you are 52 years of age or older, or if you are at risk for osteoporosis and fractures, ask your health care provider if you should be screened.  Ask your health care provider whether you should take a calcium or vitamin D supplement to lower your risk for osteoporosis.  Menopause may have certain physical symptoms and risks.  Hormone replacement therapy may reduce some of these symptoms and risks. Talk to your health care provider about whether hormone replacement therapy is right for you.  HOME CARE INSTRUCTIONS   Schedule regular health, dental, and eye exams.  Stay current with your immunizations.   Do not use any tobacco products including cigarettes, chewing tobacco, or electronic cigarettes.  If you are pregnant, do not drink alcohol.  If you are breastfeeding, limit how much and how often you drink alcohol.  Limit alcohol intake to no more than 1 drink per day for nonpregnant women. One drink equals 12 ounces of beer, 5  ounces of wine, or 1 ounces of hard liquor.  Do not use street drugs.  Do not share needles.  Ask your health care provider for help if you need support or information about quitting drugs.  Tell your health care provider if you often feel depressed.  Tell your health care provider if you have ever been abused or do not feel safe at home.   This information is not intended to replace advice given to you by your health care provider. Make sure you discuss any questions you have with your health care provider.   Document Released: 12/08/2010 Document Revised: 06/15/2014 Document Reviewed: 04/26/2013 Elsevier Interactive Patient Education [employer].

## 2015-09-27 NOTE — Progress Notes (Signed)
Pre visit review using our clinic review tool, if applicable. No additional management support is needed unless otherwise documented below in the visit note. 

## 2015-10-28 MED FILL — BYDUREON 2 MG PEN INJECT: 2 | 28 days supply | Qty: 4 | Fill #1

## 2015-11-11 ENCOUNTER — Ambulatory Visit (INDEPENDENT_AMBULATORY_CARE_PROVIDER_SITE_OTHER): Payer: 59 | Admitting: Pulmonary Disease

## 2015-11-11 ENCOUNTER — Encounter: Payer: Self-pay | Admitting: Pulmonary Disease

## 2015-11-11 ENCOUNTER — Other Ambulatory Visit: Payer: 59

## 2015-11-11 VITALS — BP 130/84 | HR 90 | Ht 63.0 in | Wt 318.0 lb

## 2015-11-11 DIAGNOSIS — I2699 Other pulmonary embolism without acute cor pulmonale: Secondary | ICD-10-CM | POA: Diagnosis not present

## 2015-11-11 LAB — D-DIMER, QUANTITATIVE: D-Dimer, Quant: 1.01 ug/mL-FEU — ABNORMAL HIGH (ref 0.00–0.48)

## 2015-11-11 NOTE — Patient Instructions (Addendum)
Check d-dimer today If high would suggest taking a full dose aspirin daily Call us as needed or for unexplained symptoms

## 2015-11-11 NOTE — Assessment & Plan Note (Signed)
She did take and decortication for 9 months and self stopped in March   Check d-dimer today If high would suggest taking a full dose aspirin daily Call us as needed or for unexplained symptoms

## 2015-11-11 NOTE — Assessment & Plan Note (Signed)
Weight loss encouraged 

## 2015-11-11 NOTE — Progress Notes (Signed)
   Subjective:    Patient ID: Amanda Murphy, female    DOB: March 01, 1981, 35 y.o.   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 Follow-up    6 month follow up for PE.   22m FU Sees dr Buddy Duty She is having heavy periods and stopped approximately been in March-this stopped her bleeding She denies any symptoms of leg swelling and chest pain or dyspnea Her urine frequency has stabilized on lower dose of desmopressin She is taking an injection for uncontrolled diabetes Her weight is unchanged    She was on hormonal Contraceptives., Did not want to use an intrauterine device and is now only using condoms  Significant tests/ events  11/2014  CT angio was obtained and indeed showed large bilateral pulmonary emboli with evidence if right heart strain by RV/LV ration of 2.1.  Lower extremity dopplers - subacute DVT in the left leg occupying: left popliteal, left posterior tibial and left peroneal vein.   Review of Systems Patient denies significant dyspnea,cough, hemoptysis,  chest pain, palpitations, pedal edema, orthopnea, paroxysmal nocturnal dyspnea, lightheadedness, nausea, vomiting, abdominal or  leg pains      Objective:   Physical Exam  Gen. Pleasant, obese, in no distress ENT - no lesions, no post nasal drip Neck: No JVD, no thyromegaly, no carotid bruits Lungs: no use of accessory muscles, no dullness to percussion, decreased without rales or rhonchi  Cardiovascular: Rhythm regular, heart sounds  normal, no murmurs or gallops, no peripheral edema Musculoskeletal: No deformities, no cyanosis or clubbing , no tremors       Assessment & Plan:

## 2015-11-13 ENCOUNTER — Telehealth: Payer: Self-pay | Admitting: Pulmonary Disease

## 2015-11-13 NOTE — Telephone Encounter (Signed)
Spoke with pt. She is aware that she will not need to repeat D-Dimer. Nothing further was needed.

## 2015-11-13 NOTE — Progress Notes (Signed)
Quick Note:  Pt notified of results ______ 

## 2015-11-13 NOTE — Telephone Encounter (Signed)
D-dimer slight high    suggest full dose aspirin daily      Pt notified of lab results and verbalized understanding  She is asking if this needs to be repeated again in the future  Please advise , thanks!!  NOTE TO TRIAGE LEAVE DETAILED MSG ON VM WHEN CALLING BACK PLEASE

## 2015-11-13 NOTE — Telephone Encounter (Signed)
Not needed

## 2015-12-12 MED FILL — BYDUREON 2 MG PEN INJECT: 2 | 28 days supply | Qty: 4 | Fill #2

## 2015-12-19 DIAGNOSIS — N879 Dysplasia of cervix uteri, unspecified: Secondary | ICD-10-CM | POA: Diagnosis not present

## 2015-12-19 DIAGNOSIS — R87610 Atypical squamous cells of undetermined significance on cytologic smear of cervix (ASC-US): Secondary | ICD-10-CM | POA: Diagnosis not present

## 2015-12-19 DIAGNOSIS — N926 Irregular menstruation, unspecified: Secondary | ICD-10-CM | POA: Diagnosis not present

## 2015-12-27 ENCOUNTER — Encounter: Payer: Self-pay | Admitting: Internal Medicine

## 2015-12-27 ENCOUNTER — Ambulatory Visit (INDEPENDENT_AMBULATORY_CARE_PROVIDER_SITE_OTHER): Payer: 59 | Admitting: Internal Medicine

## 2015-12-27 DIAGNOSIS — I2699 Other pulmonary embolism without acute cor pulmonale: Secondary | ICD-10-CM | POA: Diagnosis not present

## 2015-12-27 NOTE — Assessment & Plan Note (Signed)
She is now taking daily aspirin 325 mg. She is a non-smoker and is not a candidate for OCPs in the future.

## 2015-12-27 NOTE — Assessment & Plan Note (Signed)
Weight is about the same although she has started bydureon but she is going to make more of an effort with continuing on the exercise and working on snacking.

## 2015-12-27 NOTE — Patient Instructions (Signed)
We do not need any blood work today.   We will see you back in about 6 months for a physical.   If you have any problems or questions before then please feel free to call the office.

## 2015-12-27 NOTE — Progress Notes (Signed)
Pre visit review using our clinic review tool, if applicable. No additional management support is needed unless otherwise documented below in the visit note. 

## 2015-12-27 NOTE — Progress Notes (Signed)
   Subjective:    Patient ID: Amanda Murphy, female    DOB: [birth year], 35 y.o.   MRN: [id number]  HPI The patient is a 35 YO female coming in for follow up of her weight and pre-diabetes. She started doing bydureon shots last visit and has been doing well with them. No change to her weight but has gone through some stress and poor eating. She is just getting back to exercising. She is off the xarelto and taking daily aspirin instead per pulmonology.   Review of Systems  Constitutional: Negative for fever, activity change, appetite change, fatigue and unexpected weight change.  HENT: Negative.   Eyes: Negative.   Respiratory: Negative for cough, chest tightness, shortness of breath and wheezing.   Cardiovascular: Negative for chest pain, palpitations and leg swelling.  Gastrointestinal: Negative for nausea, abdominal pain, diarrhea, constipation and abdominal distention.  Musculoskeletal: Negative.   Skin: Negative.   Neurological: Negative for dizziness, syncope, weakness, light-headedness and headaches.  Psychiatric/Behavioral: Negative for hallucinations, dysphoric mood and decreased concentration.      Objective:   Physical Exam  Constitutional: She is oriented to person, place, and time. She appears well-developed and well-nourished.  Obese  HENT:  Head: Normocephalic and atraumatic.  Eyes: EOM are normal.  Neck: Normal range of motion.  Cardiovascular: Normal rate and regular rhythm.   Pulmonary/Chest: Effort normal and breath sounds normal. No respiratory distress. She has no wheezes. She has no rales.  Abdominal: Soft. Bowel sounds are normal. She exhibits no distension. There is no tenderness. There is no rebound.  Musculoskeletal: She exhibits no edema.  Neurological: She is alert and oriented to person, place, and time.  Skin: Skin is warm and dry.  Psychiatric: She has a normal mood and affect.   Filed Vitals:   12/27/15 0938  BP: 106/78  Pulse: 88  Temp: 98.3 F  (36.8 C)  TempSrc: Oral  Resp: 12  Height: 5\' 3"  (1.6 m)  Weight: 317 lb (143.79 kg)  SpO2: 98%      Assessment & Plan:

## 2016-01-06 DIAGNOSIS — N926 Irregular menstruation, unspecified: Secondary | ICD-10-CM | POA: Diagnosis not present

## 2016-01-06 DIAGNOSIS — D252 Subserosal leiomyoma of uterus: Secondary | ICD-10-CM | POA: Diagnosis not present

## 2016-01-06 DIAGNOSIS — D251 Intramural leiomyoma of uterus: Secondary | ICD-10-CM | POA: Diagnosis not present

## 2016-02-17 MED FILL — BYDUREON 2 MG PEN INJECT: 2 | 28 days supply | Qty: 4 | Fill #3

## 2016-03-09 IMAGING — CT CT ANGIO CHEST
2 of 9 series · 19 of 46 positions shown · IV contrast (Omni 300)
Comparison: Chest radiograph of same day.

CLINICAL DATA: Syncope, loss of consciousness.

EXAM:
CT ANGIOGRAPHY CHEST WITH CONTRAST
TECHNIQUE: Multidetector CT imaging of the chest was performed using the
standard protocol during bolus administration of intravenous
contrast. Multiplanar CT image reconstructions and MIPs were
obtained to evaluate the vascular anatomy.
CONTRAST:  100mL OMNIPAQUE IOHEXOL 350 MG/ML SOLN

[Series 5: thins · axial · 0.67mm/px · z∈[-466,-260]mm · 16 of 232 slices shown]
[im 13/232  lung]
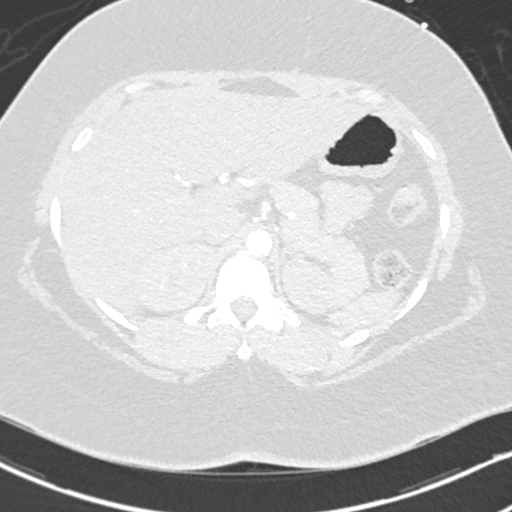
[im 26/232  soft-tissue]
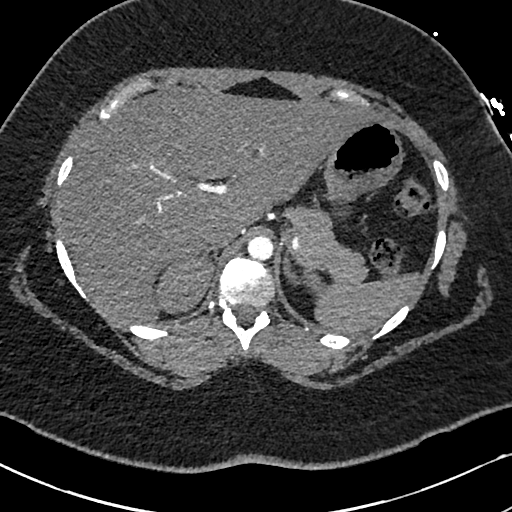
[im 39/232  lung]
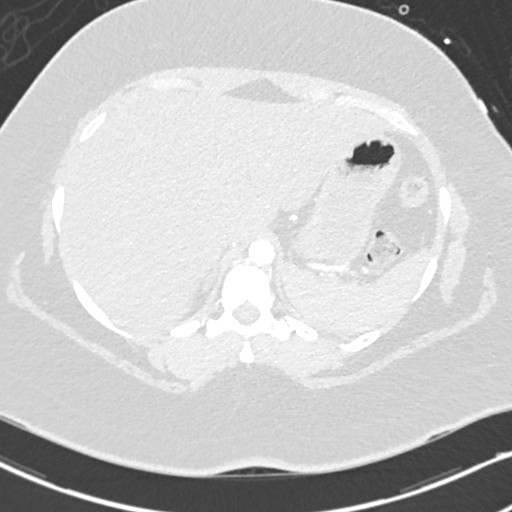
[im 52/232  soft-tissue]
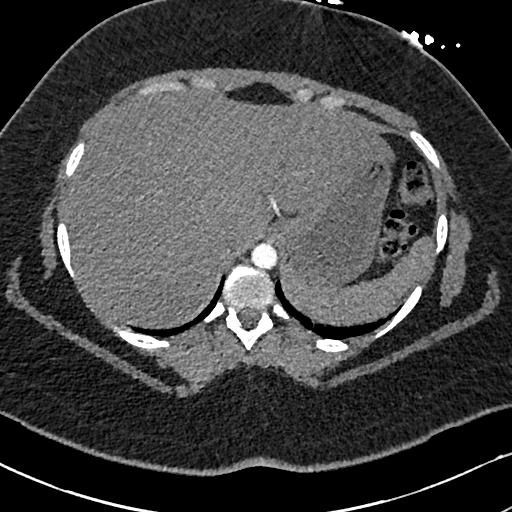
[im 65/232  lung]
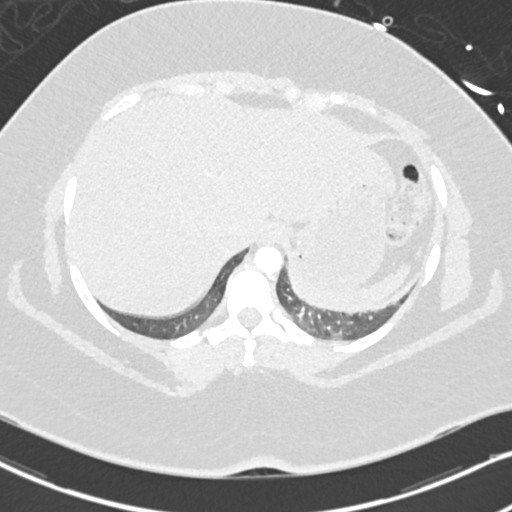
[im 78/232  soft-tissue]
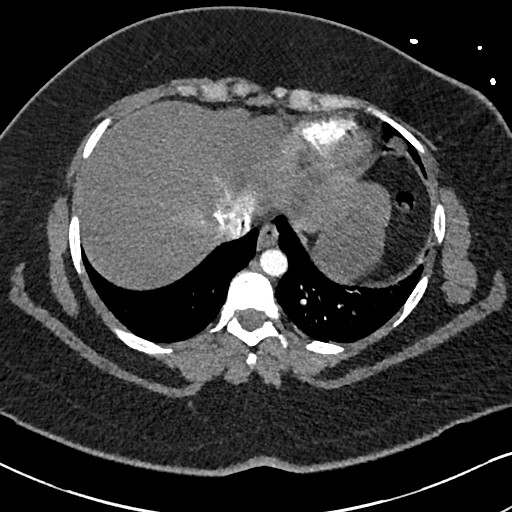
[im 90/232  lung]
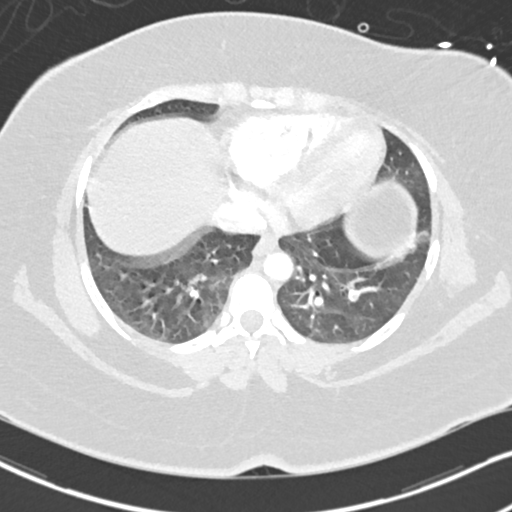
[im 103/232  soft-tissue]
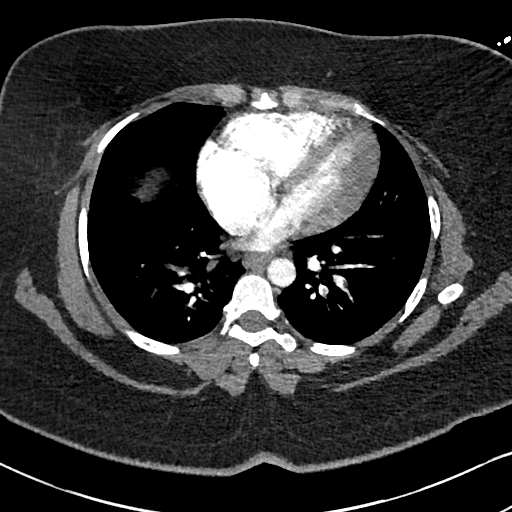
[im 129/232  lung]
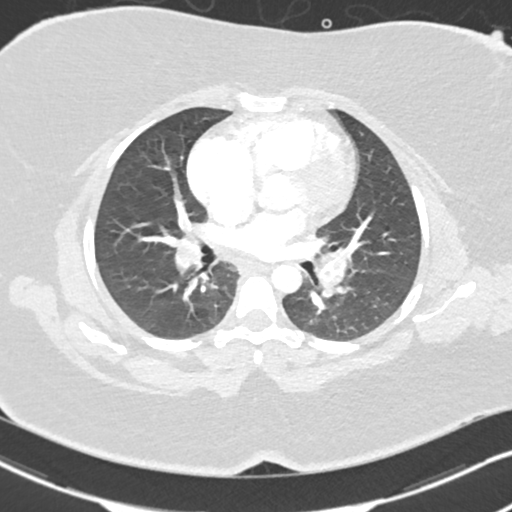
[im 142/232  soft-tissue]
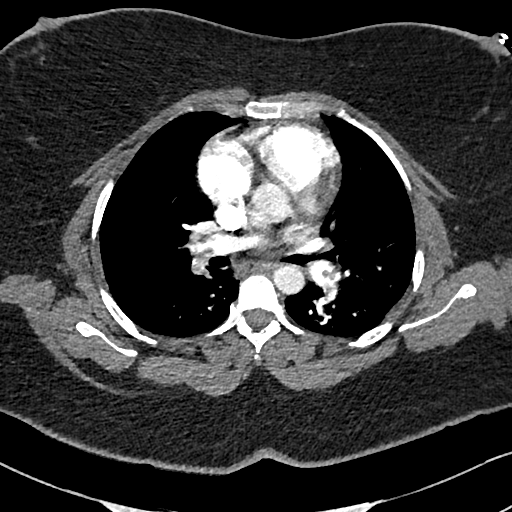
[im 155/232  lung]
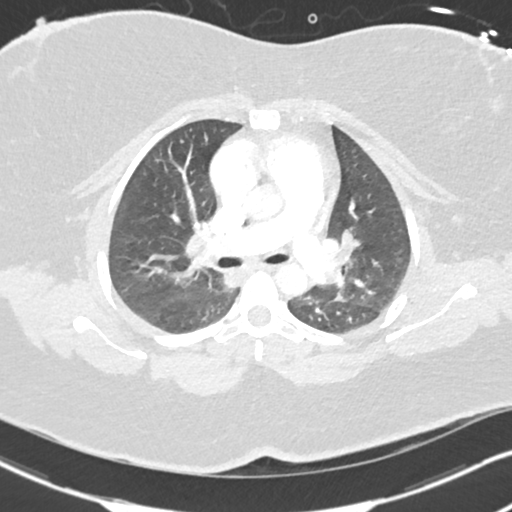
[im 167/232  soft-tissue]
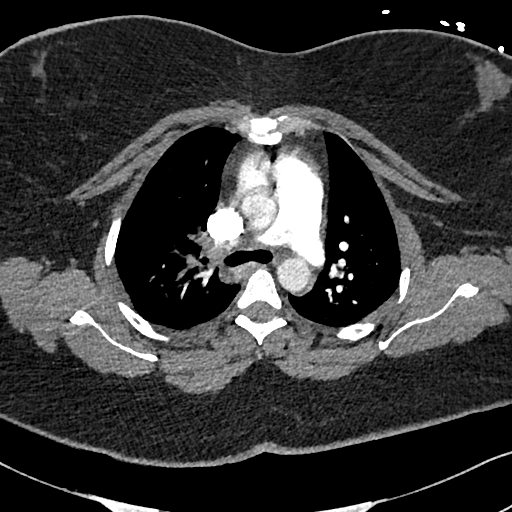
[im 180/232  lung]
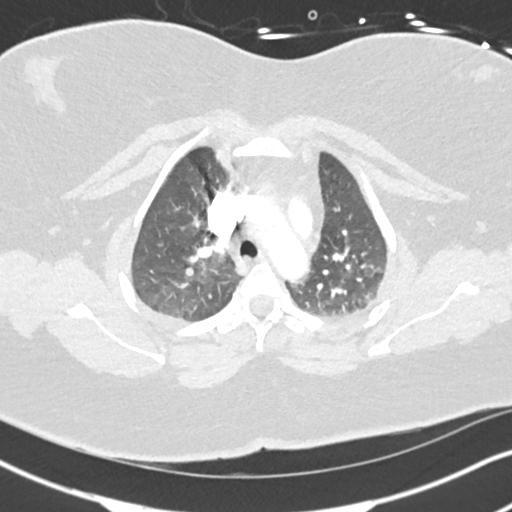
[im 193/232  soft-tissue]
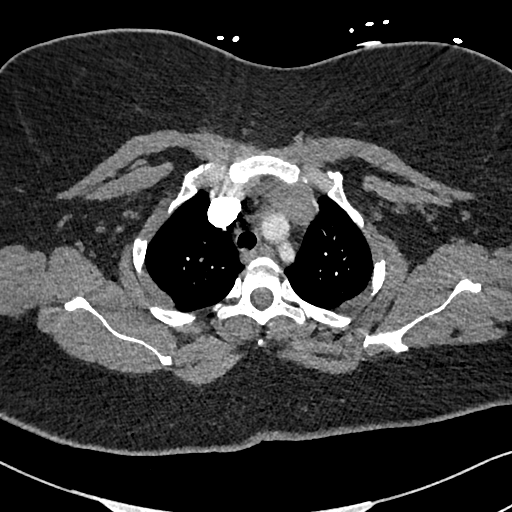
[im 206/232  lung]
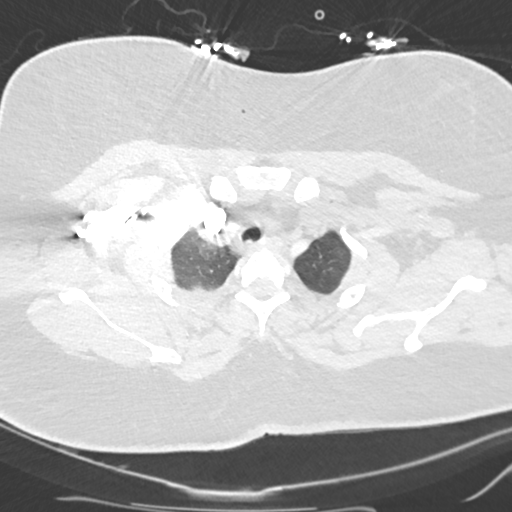
[im 219/232  soft-tissue]
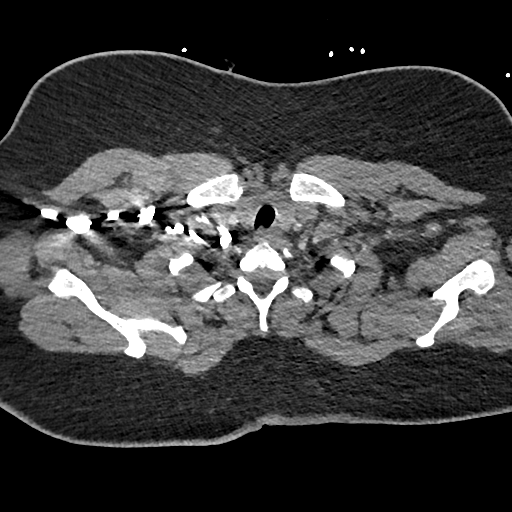

[Series 602: cor mpr · coronal · 0.67mm/px · 3 of 146 slices shown]
[im 37/146  soft-tissue]
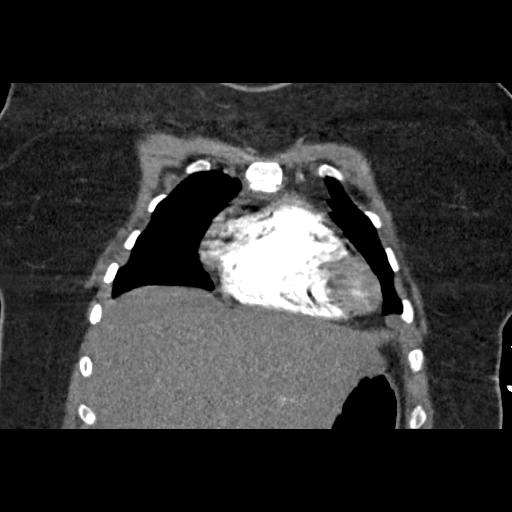
[im 73/146  soft-tissue]
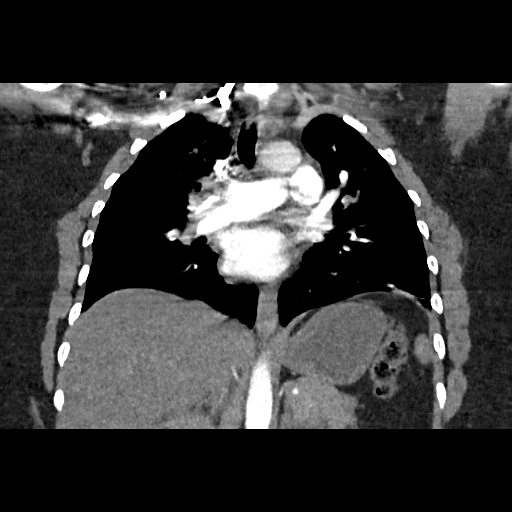
[im 109/146  soft-tissue]
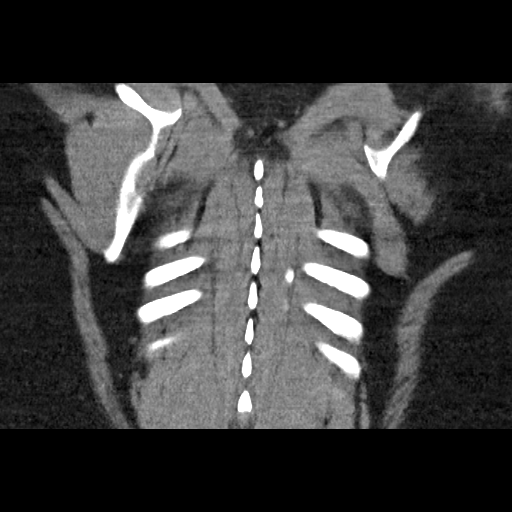

[19 of 46 positions shown; findings below may reference images not displayed]

FINDINGS: No pneumothorax or pleural effusion is noted. No acute pulmonary
disease is noted. There is no evidence of thoracic aortic aneurysm.
Large bilateral pulmonary emboli are noted with linear embolus at
the bifurcation of the main pulmonary artery consistent with saddle
embolus. RV/LV ratio of greater than 2 is noted. No mediastinal mass
or adenopathy is noted. Visualized portion of upper abdomen appears
normal. No significant osseous abnormality is noted.

Review of the MIP images confirms the above findings.
IMPRESSION: Large bilateral central pulmonary emboli are noted. Positive for
acute PE with CT evidence of right heart strain (RV/LV Ratio = 2.1)
consistent with at least submassive (intermediate risk) PE. The
presence of right heart strain has been associated with an increased
risk of morbidity and mortality. Please activate Code PE by paging
[phone]. Critical Value/emergent results were called by
telephone at the time of interpretation on 11/23/2014 at [DATE] to
Dr. Hie Square, who verbally acknowledged these results.

## 2016-03-09 IMAGING — CT CT HEAD W/O CM
1 series · 16 of 30 positions shown, 20 images · non-contrast
Comparison: MRI 07/13/2014

CLINICAL DATA: Brain tumor resection 10/31/2014.  Syncopal episode.

EXAM:
CT HEAD WITHOUT CONTRAST
TECHNIQUE: Contiguous axial images were obtained from the base of the skull
through the vertex without intravenous contrast.

[Series 2: head 5.0 h30s · axial · 0.46mm/px · z∈[-170,-30]mm · 16 of 32 slices shown, 20 images]
[im 2/32  brain]
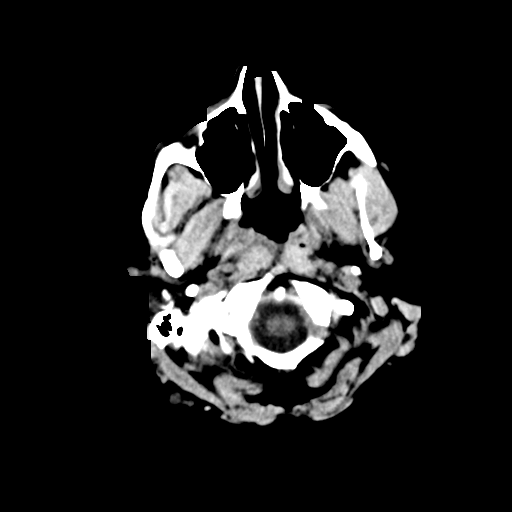
[im 2/32  bone]
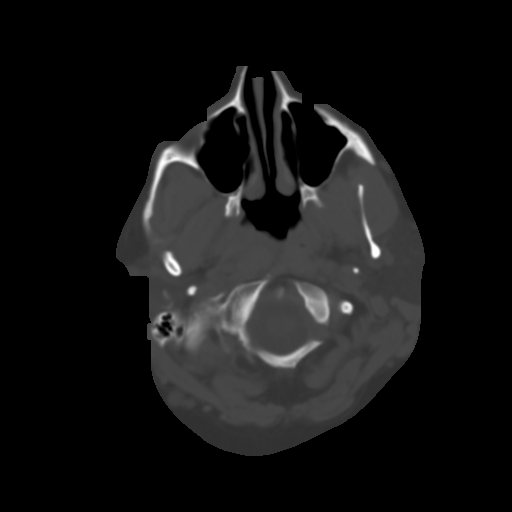
[im 4/32  brain]
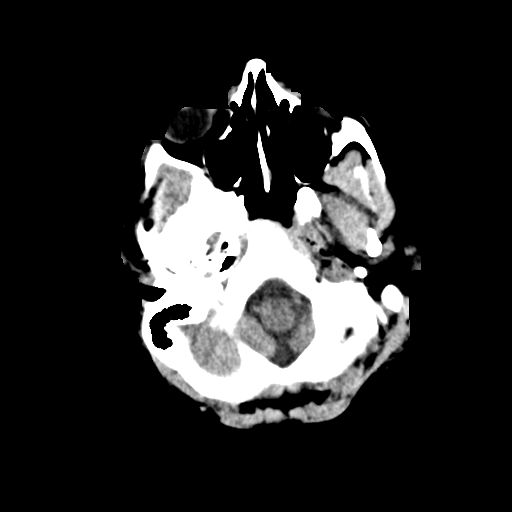
[im 6/32  brain]
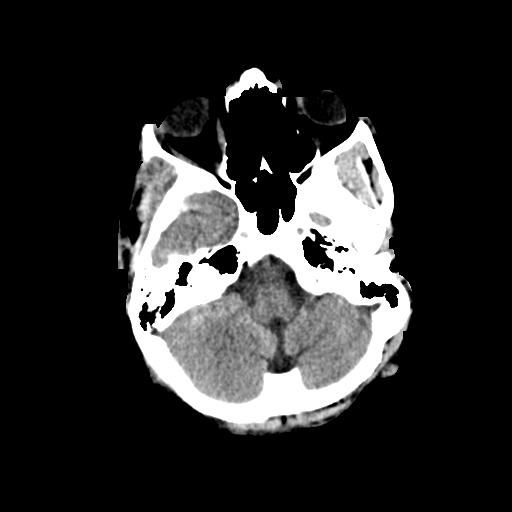
[im 8/32  brain]
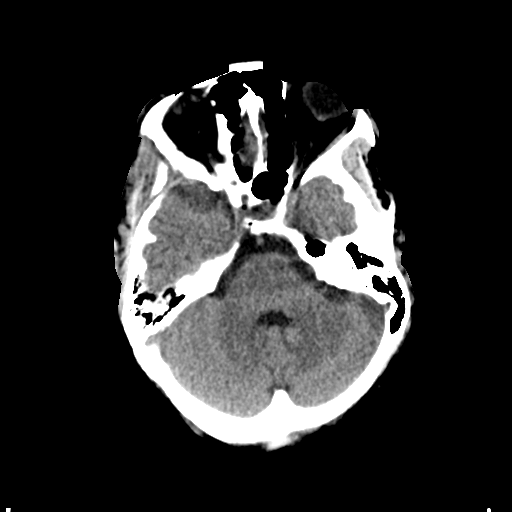
[im 9/32  brain]
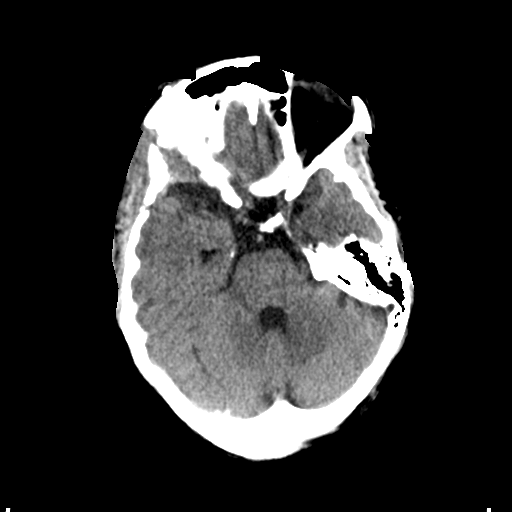
[im 9/32  bone]
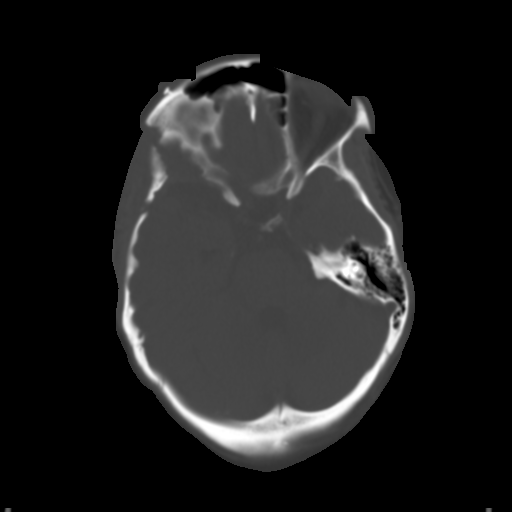
[im 11/32  brain]
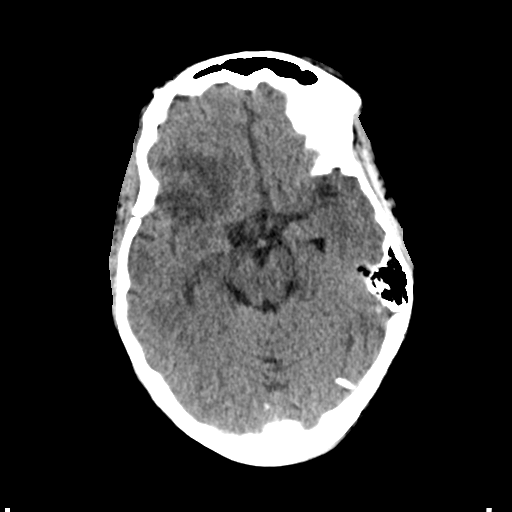
[im 13/32  brain]
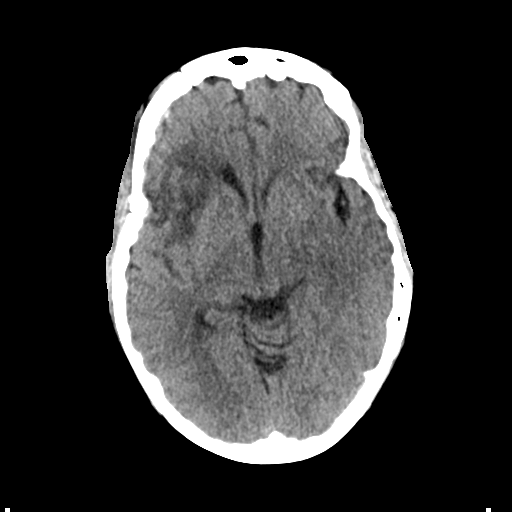
[im 15/32  brain]
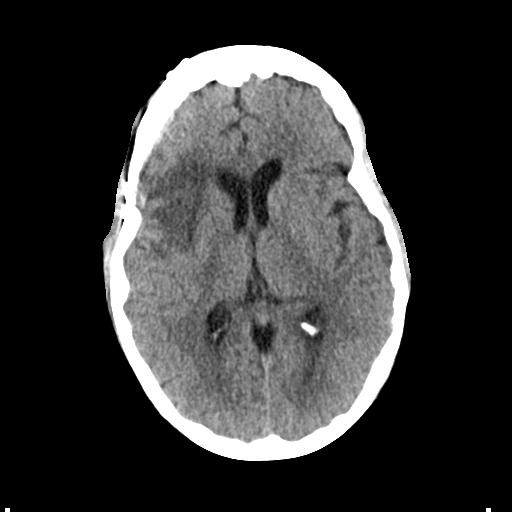
[im 17/32  brain]
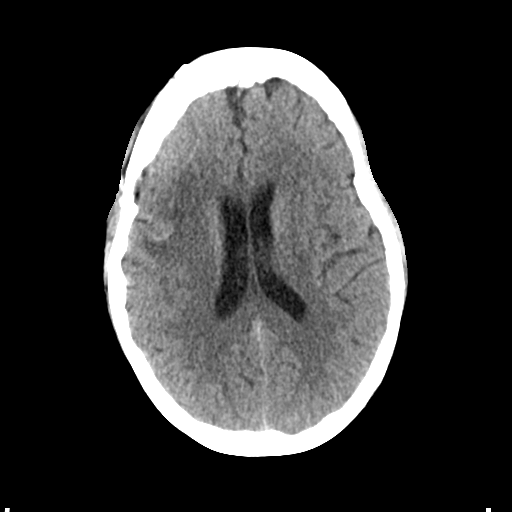
[im 17/32  bone]
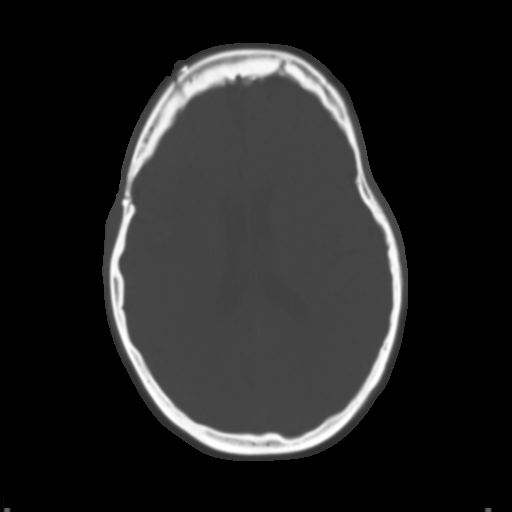
[im 19/32  brain]
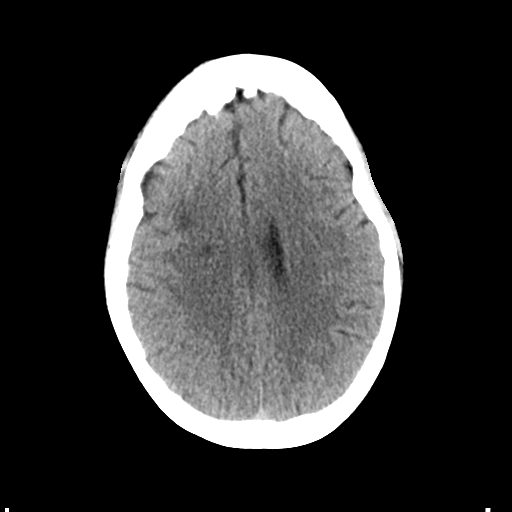
[im 21/32  brain]
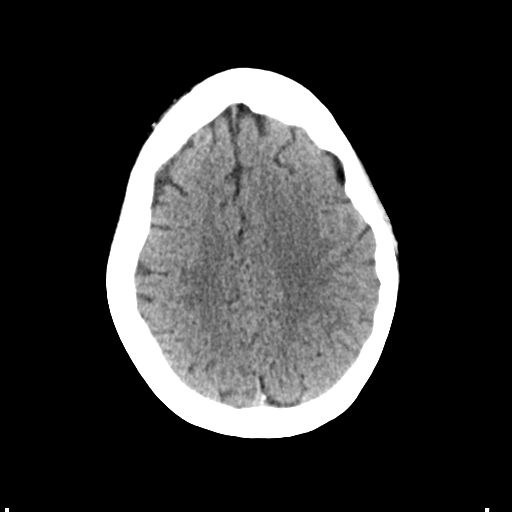
[im 23/32  brain]
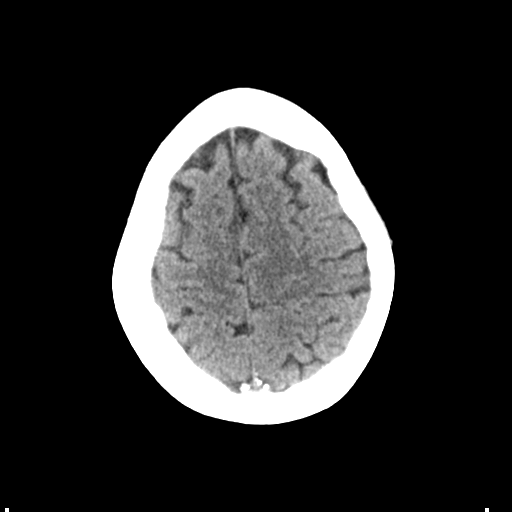
[im 24/32  brain]
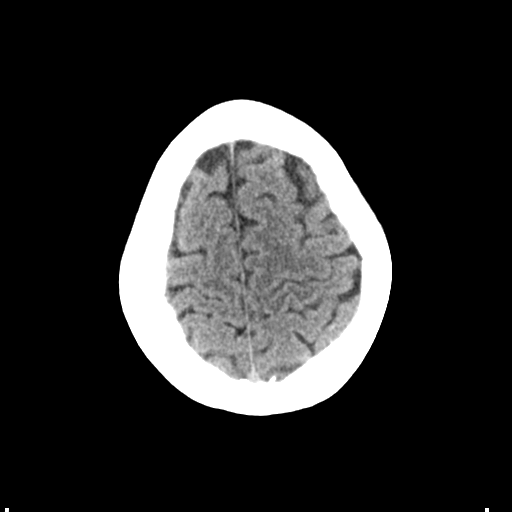
[im 24/32  bone]
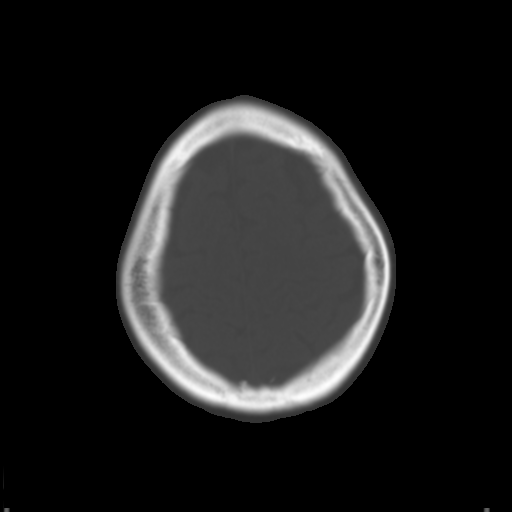
[im 26/32  brain]
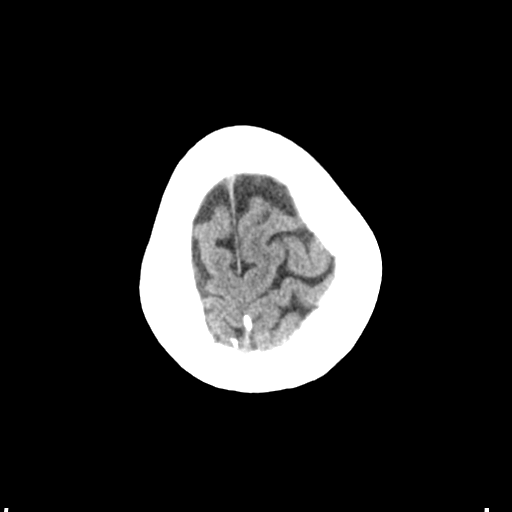
[im 28/32  brain]
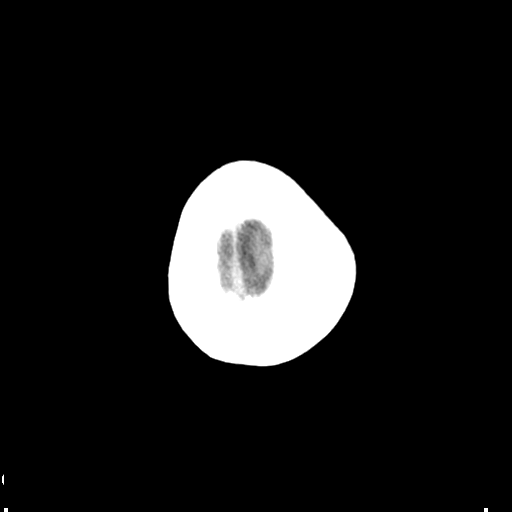
[im 30/32  brain]
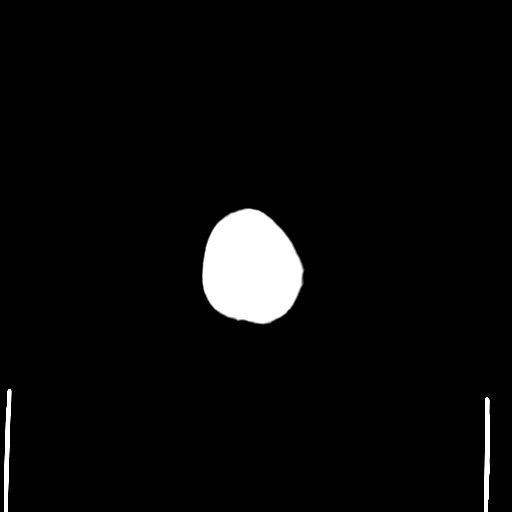

[16 of 30 positions shown; findings below may reference images not displayed]

FINDINGS: There has been recent right-sided craniotomy for resection of a
suprasellar mass consistent with craniopharyngioma. There is no
identifiable residual tumor.

The brainstem and cerebellum are normal. There is low density in the
right insular region most consistent with postoperative infarction.
The area of involvement measures approximately 4.5 cm. No evidence
of hematoma. No extra-axial fluid collection. No hydrocephalus. No
fluid in the sinuses.
IMPRESSION: Right-sided craniotomy for suprasellar craniopharyngioma resection.
4.5 cm region of low density centered in the insula on the right
consistent with postoperative infarction. No hemorrhage,
hydrocephalus or extra-axial collection.

## 2016-03-25 MED FILL — BYDUREON 2 MG PEN INJECT: 2 | 28 days supply | Qty: 4 | Fill #4

## 2016-04-14 DIAGNOSIS — H5213 Myopia, bilateral: Secondary | ICD-10-CM | POA: Diagnosis not present

## 2016-04-14 DIAGNOSIS — E118 Type 2 diabetes mellitus with unspecified complications: Secondary | ICD-10-CM | POA: Diagnosis not present

## 2016-04-14 DIAGNOSIS — H5340 Unspecified visual field defects: Secondary | ICD-10-CM | POA: Diagnosis not present

## 2016-05-11 ENCOUNTER — Encounter (INDEPENDENT_AMBULATORY_CARE_PROVIDER_SITE_OTHER): Payer: 59 | Admitting: Family Medicine

## 2016-05-11 MED FILL — BYDUREON 2 MG PEN INJECT: 2 | 28 days supply | Qty: 4 | Fill #5

## 2016-05-19 ENCOUNTER — Ambulatory Visit (INDEPENDENT_AMBULATORY_CARE_PROVIDER_SITE_OTHER): Payer: 59 | Admitting: Family Medicine

## 2016-05-19 ENCOUNTER — Encounter (INDEPENDENT_AMBULATORY_CARE_PROVIDER_SITE_OTHER): Payer: Self-pay | Admitting: Family Medicine

## 2016-05-19 VITALS — BP 131/84 | HR 91 | Temp 97.7°F | Resp 12 | Ht 64.0 in | Wt 324.4 lb

## 2016-05-19 DIAGNOSIS — Z1331 Encounter for screening for depression: Secondary | ICD-10-CM

## 2016-05-19 DIAGNOSIS — E559 Vitamin D deficiency, unspecified: Secondary | ICD-10-CM | POA: Diagnosis not present

## 2016-05-19 DIAGNOSIS — R0609 Other forms of dyspnea: Secondary | ICD-10-CM

## 2016-05-19 DIAGNOSIS — E538 Deficiency of other specified B group vitamins: Secondary | ICD-10-CM | POA: Diagnosis not present

## 2016-05-19 DIAGNOSIS — D509 Iron deficiency anemia, unspecified: Secondary | ICD-10-CM | POA: Diagnosis not present

## 2016-05-19 DIAGNOSIS — R5383 Other fatigue: Secondary | ICD-10-CM | POA: Diagnosis not present

## 2016-05-19 DIAGNOSIS — Z1389 Encounter for screening for other disorder: Secondary | ICD-10-CM | POA: Diagnosis not present

## 2016-05-19 DIAGNOSIS — R0600 Dyspnea, unspecified: Secondary | ICD-10-CM

## 2016-05-19 DIAGNOSIS — R7303 Prediabetes: Secondary | ICD-10-CM | POA: Diagnosis not present

## 2016-05-19 NOTE — Progress Notes (Signed)
error 

## 2016-05-19 NOTE — Progress Notes (Signed)
Office: [phone]  /  Fax: [phone]   HPI:   Chief Complaint: OBESITY  Amanda Murphy (MR# [id number]) is a 35 y.o. female who presents on 05/19/2016 for obesity evaluation and treatment. Current BMI is Body mass index is 55.68 kg/m.[name] [name] has struggled with obesity for years and has been unsuccessful in either losing weight or maintaining long term weight loss. [name] attended our information session and states she is currently in the action stage of change and ready to dedicate time achieving and maintaining a healthier weight.  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 she snacks frequently in the evenings she is frequently drinking liquids with calories she frequently makes poor food choices she frequently eats larger portions than normal  she has binge eating behaviors   [name] feels her energy is lower than it should be. This has worsened with weight gain and has worsened recently. [name] reports daytime somnolence and  admits to waking up still tired.  Patient is at risk for obstructive sleep apnea. Patent has a history of symptoms of Epworth sleepiness scale. Patient generally gets 4 or 5 hours of sleep per night, and states they generally have poor quality sleep and wakes up tired. Snoring is present. Apneic episodes are not present. Epworth Sleepiness Score is 17  Arial notes increasing shortness of breath with exercising and seems to be worsening over time with weight gain. She notes getting out of breath sooner with activity than she used to. This has gotten worse recently. [name] denies shortness of breath at rest or orthopnea.  Depression, positive depression screening with a high score of 15.  [name] reports excessive thirst, last A1C 6.1, PCP  started her on Bydureon to help with pre-diabetes and weight loss, no GI upset, no hypoglycemia but patient notes no improvement with polyphagia, positive family history of diabetes  B12 deficiency, on B12 supplements, no recent labs, fatigue, has history of anemia but doesn't seem related to B12.  Pt has a hx of anemia and fatigue, likely due to her uterine fibroids. No recent labs.  [id number] Food and Mood (modified PHQ-9) score was  Depression screen PHQ 2/9 05/19/2016  Decreased Interest 2  Down, Depressed, Hopeless 1  PHQ - 2 Score 3  Altered sleeping 1  Tired, decreased energy 3  Change in appetite 3  Feeling bad or failure about yourself  2  Trouble concentrating 1  Moving slowly or fidgety/restless 2  Suicidal thoughts 0  PHQ-9 Score 15  No SI/HI    ALLERGIES: No Known Allergies  MEDICATIONS: Current Outpatient Prescriptions on File Prior to Visit  Medication Sig Dispense Refill  . acetaminophen (TYLENOL) 500 MG tablet Take 1,000 mg by mouth every 6 (six) hours as needed for mild pain.    [name] ALPRAZolam (XANAX) 0.5 MG tablet Take 1 tablet (0.5 mg total) by mouth at bedtime as needed for anxiety. (Patient not taking: Reported on 12/27/2015) 20 tablet 0  . aspirin EC 325 MG tablet Take 325 mg by mouth daily.    . calcium carbonate (TUMS - DOSED IN MG ELEMENTAL CALCIUM) 500 MG chewable tablet Chew 1 tablet by mouth daily. Reported on 07/31/2015    . Cyanocobalamin (VITAMIN B 12 PO) Take 1 tablet by mouth as needed. Reported on 07/31/2015    . desmopressin (  DDAVP) 0.1 MG tablet Take 1 tablet (0.1 mg total) by mouth 3 (three) times daily. May increase to 0.2mg  tid for urinary frequency and polyuria (Patient not taking: Reported on 05/19/2016) 180 tablet 4  . Exenatide ER 2 MG PEN Inject 2 mg into the skin once a week. 4 each 11  . levocetirizine (XYZAL) 5 MG tablet Take 1 tablet (5 mg total) by mouth every evening. 30 tablet 11  . Multiple Vitamin (MULTIVITAMIN WITH MINERALS) TABS  tablet Take 1 tablet by mouth daily. Reported on 07/31/2015    . Multiple Vitamins-Minerals (EYE VITAMINS) CAPS Take by mouth. Take 3 daily with food     No current facility-administered medications on file prior to visit.     PAST MEDICAL HISTORY: Past Medical History:  Diagnosis Date  . Anemia   . Asthma    as child   . Astigmatism   . Fibroids   . Headache(784.0)   . Hx of blood clots   . Joint pain    ankles and hips  . PCOS (polycystic ovarian syndrome)   . Pneumonia    3-4 yrs ago   . Pre-diabetes   . Vision abnormalities   . Vitamin D deficiency     PAST SURGICAL HISTORY: Past Surgical History:  Procedure Laterality Date  . CRANIOTOMY N/A 10/31/2014   Procedure: CRANIOTOMY FOR RESECTION OF TUMOR;  Surgeon: Jovita Gamma, MD;  Location: [facility];  Service: Neurosurgery;  Laterality: N/A;  Craniotomy for resection of tumor  . MYOMECTOMY    . WISDOM TOOTH EXTRACTION  2006    SOCIAL HISTORY: Social History  Substance Use Topics  . Smoking status: Never Smoker  . Smokeless tobacco: Never Used  . Alcohol use 0.0 oz/week     Comment: occasional    FAMILY HISTORY: Family History  Problem Relation Age of Onset  . Hypertension Mother   . Diabetes Mother   . Obesity Mother   . Ovarian cancer    . Breast cancer    . Fibroids      ROS: Review of Systems  Constitutional: Positive for malaise/fatigue.  HENT: Positive for sinus pain.   Respiratory: Positive for shortness of breath.   Musculoskeletal: Positive for joint pain and myalgias.  Neurological: Positive for weakness.  Endo/Heme/Allergies: Positive for polydipsia.  Psychiatric/Behavioral: The patient has insomnia.     PHYSICAL EXAM: Blood pressure 131/84, pulse 91, temperature 97.7 F (36.5 C), temperature source Oral, resp. rate 12, height 5\' 4"  (1.626 m), weight (!) 324 lb 6.4 oz (147.1 kg), SpO2 99 %. Body mass index is 55.68 kg/m. Physical Exam  Constitutional: She is oriented to person,  place, and time. She appears well-developed and well-nourished.  HENT:  Head: Normocephalic and atraumatic.  Nose: Nose normal.  Eyes: EOM are normal. No scleral icterus.  Neck: Normal range of motion. Neck supple. No thyromegaly present.  Cardiovascular: Normal rate and regular rhythm.   Pulmonary/Chest: Effort normal and breath sounds normal.  Abdominal: Soft. There is no tenderness.  Musculoskeletal: Normal range of motion. She exhibits edema.  Neurological: She is alert and oriented to person, place, and time. Coordination normal.  Skin: Skin is warm and dry.  + acanthosis nigricans   Psychiatric: She has a normal mood and affect. Her behavior is normal.  Vitals reviewed.   RECENT LABS AND TESTS: BMET    Component Value Date/Time   NA 140 09/27/2015 1010   NA 136 01/18/2014 1145   K 3.9 09/27/2015 1010  CL 104 09/27/2015 1010   CO2 29 09/27/2015 1010   GLUCOSE 89 09/27/2015 1010   BUN 9 09/27/2015 1010   BUN 11 01/18/2014 1145   CREATININE 0.80 09/27/2015 1010   CALCIUM 9.1 09/27/2015 1010   GFRNONAA >60 11/29/2014 0423   GFRAA >60 11/29/2014 0423   Lab Results  Component Value Date   HGBA1C 6.1 09/27/2015   No results found for: INSULIN CBC    Component Value Date/Time   WBC 8.1 09/27/2015 1010   RBC 4.23 09/27/2015 1010   HGB 12.5 09/27/2015 1010   HCT 38.2 09/27/2015 1010   PLT 401.0 (H) 09/27/2015 1010   MCV 90.3 09/27/2015 1010   MCH 28.8 11/29/2014 0423   MCHC 32.8 09/27/2015 1010   RDW 16.4 (H) 09/27/2015 1010   LYMPHSABS 5.0 (H) 11/23/2014 1339   MONOABS 0.4 11/23/2014 1339   EOSABS 0.3 11/23/2014 1339   BASOSABS 0.0 11/23/2014 1339   Iron/TIBC/Ferritin/ %Sat No results found for: IRON, TIBC, FERRITIN, IRONPCTSAT Lipid Panel     Component Value Date/Time   CHOL 171 09/27/2015 1010   TRIG 108.0 09/27/2015 1010   HDL 66.70 09/27/2015 1010   CHOLHDL 3 09/27/2015 1010   VLDL 21.6 09/27/2015 1010   LDLCALC 82 09/27/2015 1010   Hepatic  Function Panel     Component Value Date/Time   PROT 7.2 09/27/2015 1010   ALBUMIN 3.5 09/27/2015 1010   AST 22 09/27/2015 1010   ALT 33 09/27/2015 1010   ALKPHOS 71 09/27/2015 1010   BILITOT 0.3 09/27/2015 1010      Component Value Date/Time   TSH 0.239 (L) 11/26/2014 1325    ECG done today shows NSR @ 89 BPM INDIRECT CALORIMETER done today shows a VO2 of 241 and a REE of 1679.    ASSESSMENT AND PLAN: Other fatigue - Plan: Comprehensive metabolic panel, Folate, T3, T4, free, TSH, Lipid Panel With LDL/HDL Ratio  Dyspnea on exertion  B12 nutritional deficiency - Plan: Vitamin B12  Iron deficiency anemia, unspecified iron deficiency anemia type - Plan: Iron and TIBC, Ferritin, CBC with Differential/Platelet  Prediabetes - Plan: Hemoglobin A1c, Insulin, fasting  Vitamin D deficiency - Plan: VITAMIN D 25 Hydroxy (Vit-D Deficiency, Fractures)  PLAN:  Fatigue Kadey was informed that her fatigue may be related to obesity, depression or many other causes. Labs will be ordered, and in the meanwhile Yaretsy has agreed to work on diet, exercise and weight loss to help with fatigue. Proper sleep hygiene was discussed including the need for 7-8 hours of quality sleep each night. A sleep study was not ordered based on symptoms and Epworth score.  Exercise Induced Shortness of Breath Taydem's shortness of breath appears to be obesity related and exercise induced. She has agreed to work on weight loss and gradually increase exercise to treat her exercise induced shortness of breath. If Taigen follows our instructions and loses weight without improvement of her shortness of breath, we will plan to refer to pulmonology. We will monitor this condition regularly. Cotrina agrees to this plan.  Iron deficiency anemia With history of fibroids, will check labs and follow.  B12 deficiency Check labs and follow.  Vitamin D Deficiency Shaniesha was informed that low vitamin D levels  contributes to fatigue and are associated with obesity, breast, and colon cancer. Will check labs and follow.  Pre-Diabetes Merica will continue to work on weight loss, exercise, and decreasing simple carbohydrates in her diet to help decrease the risk of diabetes.  Cailen agreed  to follow up with [place] as directed to monitor her progress. Will check labs and follow.  Positive depression screen With a high result of 15. Will work on emotional eating and may consider referral to psychology if no improvement. Obesity Kataliya is currently in the action stage of change and her goal is to continue with weight loss efforts She has agreed to follow the Category 3 plan Marzelle has been instructed to work up to a goal of 150 minutes of combined cardio and strengthening exercise per week for weight loss and overall health benefits. Track baseline steps. We discussed the following Behavioral Modification Stratagies today: increasing lean protein intake, work on meal planning and easy cooking plans and ways to avoid night time snacking  Depression Screen Tobin had a positive depression screening. Depression is commonly associated with obesity and often results in emotional eating behaviors. We will monitor this closely and work on CBT to help improve the non-hunger eating patterns. Referral to Psychology may be required if no improvement is seen as she continues in our clinic.   Morelia has agreed to follow up with our clinic in 2 weeks. She was informed of the importance of frequent follow up visits to maximize her success with intensive lifestyle modifications for her multiple health conditions. She was informed we would discuss her lab results at her next visit unless there is a critical issue that needs to be addressed sooner. Ozioma agreed to keep her next visit at the agreed upon time to discuss these results.

## 2016-05-20 LAB — IRON AND TIBC
IRON SATURATION: 18 % (ref 15–55)
IRON: 63 ug/dL (ref 27–159)
Total Iron Binding Capacity: 343 ug/dL (ref 250–450)
UIBC: 280 ug/dL (ref 131–425)

## 2016-05-20 LAB — LIPID PANEL WITH LDL/HDL RATIO
Cholesterol, Total: 196 mg/dL (ref 100–199)
HDL: 72 mg/dL (ref >39)
LDL CALC: 103 mg/dL — AB (ref 0–99)
LDl/HDL Ratio: 1.4 ratio units (ref 0.0–3.2)
Triglycerides: 104 mg/dL (ref 0–149)
VLDL CHOLESTEROL CAL: 21 mg/dL (ref 5–40)

## 2016-05-20 LAB — COMPREHENSIVE METABOLIC PANEL
ALK PHOS: 84 IU/L (ref 39–117)
ALT: 33 IU/L — AB (ref 0–32)
AST: 23 IU/L (ref 0–40)
Albumin/Globulin Ratio: 1.1 — ABNORMAL LOW (ref 1.2–2.2)
Albumin: 3.7 g/dL (ref 3.5–5.5)
BILIRUBIN TOTAL: 0.3 mg/dL (ref 0.0–1.2)
BUN/Creatinine Ratio: 11 (ref 9–23)
BUN: 10 mg/dL (ref 6–20)
CHLORIDE: 101 mmol/L (ref 96–106)
CO2: 25 mmol/L (ref 18–29)
Calcium: 9.2 mg/dL (ref 8.7–10.2)
Creatinine, Ser: 0.87 mg/dL (ref 0.57–1.00)
GFR calc Af Amer: 99 mL/min/{1.73_m2} (ref >59)
GFR calc non Af Amer: 86 mL/min/{1.73_m2} (ref >59)
GLUCOSE: 85 mg/dL (ref 65–99)
Globulin, Total: 3.4 g/dL (ref 1.5–4.5)
Potassium: 4.2 mmol/L (ref 3.5–5.2)
Sodium: 141 mmol/L (ref 134–144)
TOTAL PROTEIN: 7.1 g/dL (ref 6.0–8.5)

## 2016-05-20 LAB — CBC WITH DIFFERENTIAL/PLATELET
BASOS ABS: 0 10*3/uL (ref 0.0–0.2)
Basos: 0 %
EOS (ABSOLUTE): 0.2 10*3/uL (ref 0.0–0.4)
Eos: 2 %
Hematocrit: 41.6 % (ref 34.0–46.6)
Hemoglobin: 13.5 g/dL (ref 11.1–15.9)
IMMATURE GRANS (ABS): 0 10*3/uL (ref 0.0–0.1)
Immature Granulocytes: 0 %
Lymphocytes Absolute: 3.1 10*3/uL (ref 0.7–3.1)
Lymphs: 41 %
MCH: 30 pg (ref 26.6–33.0)
MCHC: 32.5 g/dL (ref 31.5–35.7)
MCV: 92 fL (ref 79–97)
Monocytes Absolute: 0.7 10*3/uL (ref 0.1–0.9)
Monocytes: 9 %
NEUTROS ABS: 3.6 10*3/uL (ref 1.4–7.0)
NEUTROS PCT: 48 %
PLATELETS: 352 10*3/uL (ref 150–379)
RBC: 4.5 x10E6/uL (ref 3.77–5.28)
RDW: 16.1 % — ABNORMAL HIGH (ref 12.3–15.4)
WBC: 7.5 10*3/uL (ref 3.4–10.8)

## 2016-05-20 LAB — T3: T3 TOTAL: 130 ng/dL (ref 71–180)

## 2016-05-20 LAB — VITAMIN D 25 HYDROXY (VIT D DEFICIENCY, FRACTURES): Vit D, 25-Hydroxy: 23.2 ng/mL — ABNORMAL LOW (ref 30.0–100.0)

## 2016-05-20 LAB — FOLATE

## 2016-05-20 LAB — FERRITIN: FERRITIN: 34 ng/mL (ref 15–150)

## 2016-05-20 LAB — HEMOGLOBIN A1C
ESTIMATED AVERAGE GLUCOSE: 120 mg/dL
Hgb A1c MFr Bld: 5.8 % — ABNORMAL HIGH (ref 4.8–5.6)

## 2016-05-20 LAB — TSH: TSH: 1.42 u[IU]/mL (ref 0.450–4.500)

## 2016-05-20 LAB — T4, FREE: Free T4: 0.99 ng/dL (ref 0.82–1.77)

## 2016-05-20 LAB — VITAMIN B12: VITAMIN B 12: 1635 pg/mL — AB (ref 232–1245)

## 2016-05-21 NOTE — Addendum Note (Signed)
Addended by: Dennard Nip D on: 05/21/2016 10:40 AM   Modules accepted: Orders

## 2016-05-28 ENCOUNTER — Ambulatory Visit (INDEPENDENT_AMBULATORY_CARE_PROVIDER_SITE_OTHER): Payer: 59 | Admitting: Family Medicine

## 2016-05-28 VITALS — BP 127/81 | HR 88 | Temp 97.5°F | Resp 14 | Ht 64.0 in | Wt 320.8 lb

## 2016-05-28 DIAGNOSIS — Z9189 Other specified personal risk factors, not elsewhere classified: Secondary | ICD-10-CM | POA: Diagnosis not present

## 2016-05-28 DIAGNOSIS — R5383 Other fatigue: Secondary | ICD-10-CM

## 2016-05-28 DIAGNOSIS — E559 Vitamin D deficiency, unspecified: Secondary | ICD-10-CM | POA: Diagnosis not present

## 2016-05-28 DIAGNOSIS — R7303 Prediabetes: Secondary | ICD-10-CM

## 2016-05-28 MED ORDER — VITAMIN D3 1.25 MG (50000 UT) PO CAPS: ORAL_CAPSULE | ORAL | 0 refills | Status: DC

## 2016-05-28 MED FILL — VIT D3-50 50,000 UNITS CAPS: 1.25 MG | 28 days supply | Qty: 4 | Fill #0

## 2016-05-28 NOTE — Progress Notes (Signed)
Office: [phone]  /  Fax: [phone]   HPI:   Chief Complaint: OBESITY Amanda Murphy is here to discuss her progress with her obesity treatment plan. She is following her eating plan approximately 95 % of the time and states she is exercising 0 minutes 0 times per week. Amanda Murphy is currently struggling with planning ahead. She did deviate on her plan significantly with [employer] shakes.  Her weight is (!) 320 lb 12.8 oz (145.5 kg) today and has had a weight loss of 4 pounds over a period of 9 days since her last visit. She has lost 4 lbs since starting treatment with [place].  Vitamin D deficiency Amanda Murphy has a new diagnosis of vitamin D deficiency. She is not currently taking vit D and denies nausea, vomiting or muscle weakness.  Pre-Diabetes Amanda Murphy has a diagnosis of prediabetes and was informed this puts her at greater risk of developing diabetes. She is taking Bydureon currently and continues to work on diet and exercise to decrease risk of diabetes. She denies nausea or hypoglycemia.   Wt Readings from Last 500 Encounters:  05/28/16 (!) 320 lb 12.8 oz (145.5 kg)  05/19/16 (!) 324 lb 6.4 oz (147.1 kg)  12/27/15 (!) 317 lb (143.8 kg)  11/11/15 (!) 318 lb (144.2 kg)  09/27/15 (!) 314 lb 1.9 oz (142.5 kg)  07/31/15 (!) 307 lb 12.8 oz (139.6 kg)  05/15/15 298 lb (135.2 kg)  01/07/15 272 lb (123.4 kg)  12/25/14 268 lb (121.6 kg)  11/29/14 252 lb 8 oz (114.5 kg)  10/31/14 244 lb 0.8 oz (110.7 kg)  10/19/14 244 lb 11.2 oz (111 kg)  10/19/14 244 lb (110.7 kg)  09/19/14 241 lb 6.4 oz (109.5 kg)  08/16/14 238 lb (108 kg)  05/14/14 240 lb 12.8 oz (109.2 kg)  02/07/14 236 lb (107 kg)  01/18/14 240 lb (108.9 kg)     ALLERGIES: No Known Allergies  MEDICATIONS: Current Outpatient Prescriptions on File Prior to Visit  Medication Sig Dispense Refill  . acetaminophen (TYLENOL) 500 MG tablet Take 1,000 mg by mouth every 6 (six) hours as needed for mild pain.    [name] ALPRAZolam (XANAX)  0.5 MG tablet Take 1 tablet (0.5 mg total) by mouth at bedtime as needed for anxiety. (Patient not taking: Reported on 12/27/2015) 20 tablet 0  . aspirin EC 325 MG tablet Take 325 mg by mouth daily.    . calcium carbonate (TUMS - DOSED IN MG ELEMENTAL CALCIUM) 500 MG chewable tablet Chew 1 tablet by mouth daily. Reported on 07/31/2015    . Cyanocobalamin (VITAMIN B 12 PO) Take 1 tablet by mouth as needed. Reported on 07/31/2015    . desmopressin (DDAVP) 0.1 MG tablet Take 1 tablet (0.1 mg total) by mouth 3 (three) times daily. May increase to 0.2mg  tid for urinary frequency and polyuria (Patient not taking: Reported on 05/19/2016) 180 tablet 4  . Exenatide ER 2 MG PEN Inject 2 mg into the skin once a week. 4 each 11  . levocetirizine (XYZAL) 5 MG tablet Take 1 tablet (5 mg total) by mouth every evening. 30 tablet 11  . Multiple Vitamin (MULTIVITAMIN WITH MINERALS) TABS tablet Take 1 tablet by mouth daily. Reported on 07/31/2015    . Multiple Vitamins-Minerals (EYE VITAMINS) CAPS Take by mouth. Take 3 daily with food     No current facility-administered medications on file prior to visit.     PAST MEDICAL HISTORY: Past Medical History:  Diagnosis Date  . Anemia   .  Asthma    as child   . Astigmatism   . Fibroids   . Headache(784.0)   . Hx of blood clots   . Joint pain    ankles and hips  . PCOS (polycystic ovarian syndrome)   . Pneumonia    3-4 yrs ago   . Pre-diabetes   . Vision abnormalities   . Vitamin D deficiency     PAST SURGICAL HISTORY: Past Surgical History:  Procedure Laterality Date  . CRANIOTOMY N/A 10/31/2014   Procedure: CRANIOTOMY FOR RESECTION OF TUMOR;  Surgeon: Jovita Gamma, MD;  Location: [facility];  Service: Neurosurgery;  Laterality: N/A;  Craniotomy for resection of tumor  . MYOMECTOMY    . WISDOM TOOTH EXTRACTION  2006    SOCIAL HISTORY: Social History  Substance Use Topics  . Smoking status: Never Smoker  . Smokeless tobacco: Never Used  .  Alcohol use 0.0 oz/week     Comment: occasional    FAMILY HISTORY: Family History  Problem Relation Age of Onset  . Hypertension Mother   . Diabetes Mother   . Obesity Mother   . Ovarian cancer    . Breast cancer    . Fibroids      ROS: Review of Systems  Constitutional: Positive for weight loss.  Gastrointestinal: Negative for nausea and vomiting.    PHYSICAL EXAM: Blood pressure 127/81, pulse 88, temperature 97.5 F (36.4 C), temperature source Oral, resp. rate 14, height 5\' 4"  (1.626 m), weight (!) 320 lb 12.8 oz (145.5 kg), last menstrual period 05/14/2016, SpO2 97 %. Body mass index is 55.07 kg/m. Physical Exam  Constitutional: She is oriented to person, place, and time. She appears well-developed and well-nourished.  Neurological: She is oriented to person, place, and time.    RECENT LABS AND TESTS: BMET    Component Value Date/Time   NA 141 05/19/2016 1053   K 4.2 05/19/2016 1053   CL 101 05/19/2016 1053   CO2 25 05/19/2016 1053   GLUCOSE 85 05/19/2016 1053   GLUCOSE 89 09/27/2015 1010   BUN 10 05/19/2016 1053   CREATININE 0.87 05/19/2016 1053   CALCIUM 9.2 05/19/2016 1053   GFRNONAA 86 05/19/2016 1053   GFRAA 99 05/19/2016 1053   Lab Results  Component Value Date   HGBA1C 5.8 (H) 05/19/2016   HGBA1C 6.1 09/27/2015   HGBA1C 5.8 09/19/2014   No results found for: INSULIN CBC    Component Value Date/Time   WBC 7.5 05/19/2016 1053   WBC 8.1 09/27/2015 1010   RBC 4.50 05/19/2016 1053   RBC 4.23 09/27/2015 1010   HGB 12.5 09/27/2015 1010   HCT 41.6 05/19/2016 1053   PLT 352 05/19/2016 1053   MCV 92 05/19/2016 1053   MCH 30.0 05/19/2016 1053   MCH 28.8 11/29/2014 0423   MCHC 32.5 05/19/2016 1053   MCHC 32.8 09/27/2015 1010   RDW 16.1 (H) 05/19/2016 1053   LYMPHSABS 3.1 05/19/2016 1053   MONOABS 0.4 11/23/2014 1339   EOSABS 0.2 05/19/2016 1053   BASOSABS 0.0 05/19/2016 1053   Iron/TIBC/Ferritin/ %Sat    Component Value Date/Time   IRON 63  05/19/2016 1105   TIBC 343 05/19/2016 1105   FERRITIN 34 05/19/2016 1105   IRONPCTSAT 18 05/19/2016 1105   Lipid Panel     Component Value Date/Time   CHOL 196 05/19/2016 1235   TRIG 104 05/19/2016 1235   HDL 72 05/19/2016 1235   CHOLHDL 3 09/27/2015 1010   VLDL 21.6 09/27/2015 1010  LDLCALC 103 (H) 05/19/2016 1235   Hepatic Function Panel     Component Value Date/Time   PROT 7.1 05/19/2016 1053   ALBUMIN 3.7 05/19/2016 1053   AST 23 05/19/2016 1053   ALT 33 (H) 05/19/2016 1053   ALKPHOS 84 05/19/2016 1053   BILITOT 0.3 05/19/2016 1053      Component Value Date/Time   TSH 1.420 05/19/2016 1053   TSH 0.239 (L) 11/26/2014 1325    ASSESSMENT AND PLAN: Vitamin D deficiency - Plan: Cholecalciferol (VITAMIN D3) 50000 units CAPS  Prediabetes  Other fatigue - Plan: EKG 12-Lead  PLAN:   Vitamin D Deficiency Jincy was informed that low vitamin D levels contributes to fatigue and are associated with obesity, breast, and colon cancer. Start vitamin D 50,000 IUevery week #4, no refills.  Pre-Diabetes Brookie will continue to work on weight loss, exercise, and decreasing simple carbohydrates in her diet to help decrease the risk of diabetes. She was informed that eating too many simple carbohydrates or too many calories at one sitting increases the likelihood of GI side effects. Natalija agreed to follow up with [place] as directed to monitor her progress. Rebecca will continue Bydureon as prescribed by PCP, will work on decreasing simple carbs, plan to continue weight loss, recheck labs in 3 months.  Diabetes risk counselling Avva was given extended (at least 15 minutes) diabetes prevention counseling today. She is 35 y.o. female and has risk factors for diabetes including obesity. We discussed intensive lifestyle modifications today with an emphasis on weight loss as well as increasing exercise and decreasing simple carbohydrates in her diet.   Obesity .Ladonya is  currently in the action stage of change As such, her goal is to continue with weight loss efforts She has agreed to follow the Category 3 plan, journaling 200-350 calories at breakfast Feliz has been instructed to work up to a goal of 150 minutes of combined cardio and strengthening exercise per week for weight loss and overall health benefits. We discussed the following Behavioral Modification Stratagies today: increasing lean protein intake, work on meal planning and easy cooking plans and holiday eating strategies   Jaleigh has agreed to follow up with our clinic in 2 weeks. She was informed of the importance of frequent follow up visits to maximize her success with intensive lifestyle modifications for her multiple health conditions.  I, Doreene Nest, am acting as scribe for Dennard Nip, MD  I have reviewed the above documentation for accuracy and completeness, and I agree with the above. -Dennard Nip, MD

## 2016-06-16 ENCOUNTER — Encounter (INDEPENDENT_AMBULATORY_CARE_PROVIDER_SITE_OTHER): Payer: Self-pay | Admitting: Family Medicine

## 2016-06-16 ENCOUNTER — Ambulatory Visit (INDEPENDENT_AMBULATORY_CARE_PROVIDER_SITE_OTHER): Payer: 59 | Admitting: Family Medicine

## 2016-06-16 VITALS — BP 132/85 | HR 91 | Temp 97.7°F | Ht 64.0 in | Wt 320.0 lb

## 2016-06-16 DIAGNOSIS — E559 Vitamin D deficiency, unspecified: Secondary | ICD-10-CM | POA: Diagnosis not present

## 2016-06-16 MED ORDER — VITAMIN D3 1.25 MG (50000 UT) PO CAPS: ORAL_CAPSULE | ORAL | 0 refills | Status: DC

## 2016-06-16 NOTE — Progress Notes (Signed)
Office: [phone]  /  Fax: [phone]   HPI:   Chief Complaint: OBESITY Amanda Murphy is here to discuss her progress with her obesity treatment plan. She is following her eating plan approximately 75 % of the time and states she is walking 45 minutes 2 times per week. Amanda Murphy is currently struggling with increased cravings and temptations at work and at [employer] over Christmas.  Amanda Murphy reports getting bored with dinner and would like more options, she didn't journal for breakfast and she is not eating enough protein overall. Her weight is (!) 320 lb (145.2 kg) today and has maintained her weight since her last visit. She has lost 4 lbs since starting treatment with [place].   Vitamin D deficiency Amanda Murphy has a diagnosis of vitamin D deficiency. She is currently taking vit D and denies nausea, vomiting or muscle weakness.  Wt Readings from Last 500 Encounters:  06/16/16 (!) 320 lb (145.2 kg)  05/28/16 (!) 320 lb 12.8 oz (145.5 kg)  05/19/16 (!) 324 lb 6.4 oz (147.1 kg)  12/27/15 (!) 317 lb (143.8 kg)  11/11/15 (!) 318 lb (144.2 kg)  09/27/15 (!) 314 lb 1.9 oz (142.5 kg)  07/31/15 (!) 307 lb 12.8 oz (139.6 kg)  05/15/15 298 lb (135.2 kg)  01/07/15 272 lb (123.4 kg)  12/25/14 268 lb (121.6 kg)  11/29/14 252 lb 8 oz (114.5 kg)  10/31/14 244 lb 0.8 oz (110.7 kg)  10/19/14 244 lb 11.2 oz (111 kg)  10/19/14 244 lb (110.7 kg)  09/19/14 241 lb 6.4 oz (109.5 kg)  08/16/14 238 lb (108 kg)  05/14/14 240 lb 12.8 oz (109.2 kg)  02/07/14 236 lb (107 kg)  01/18/14 240 lb (108.9 kg)     ALLERGIES: No Known Allergies  MEDICATIONS: Current Outpatient Prescriptions on File Prior to Visit  Medication Sig Dispense Refill  . acetaminophen (TYLENOL) 500 MG tablet Take 1,000 mg by mouth every 6 (six) hours as needed for mild pain.    [name] ALPRAZolam (XANAX) 0.5 MG tablet Take 1 tablet (0.5 mg total) by mouth at bedtime as needed for anxiety. 20 tablet 0  . aspirin EC 325 MG tablet Take 325  mg by mouth daily.    . calcium carbonate (TUMS - DOSED IN MG ELEMENTAL CALCIUM) 500 MG chewable tablet Chew 1 tablet by mouth daily. Reported on 07/31/2015    . Exenatide ER 2 MG PEN Inject 2 mg into the skin once a week. 4 each 11  . levocetirizine (XYZAL) 5 MG tablet Take 1 tablet (5 mg total) by mouth every evening. 30 tablet 11  . Multiple Vitamins-Minerals (EYE VITAMINS) CAPS Take by mouth. Take 3 daily with food    . Cyanocobalamin (VITAMIN B 12 PO) Take 1 tablet by mouth as needed. Reported on 07/31/2015    . desmopressin (DDAVP) 0.1 MG tablet Take 1 tablet (0.1 mg total) by mouth 3 (three) times daily. May increase to 0.2mg  tid for urinary frequency and polyuria (Patient not taking: Reported on 06/16/2016) 180 tablet 4  . Multiple Vitamin (MULTIVITAMIN WITH MINERALS) TABS tablet Take 1 tablet by mouth daily. Reported on 07/31/2015     No current facility-administered medications on file prior to visit.     PAST MEDICAL HISTORY: Past Medical History:  Diagnosis Date  . Anemia   . Asthma    as child   . Astigmatism   . Fibroids   . Headache(784.0)   . Hx of blood clots   . Joint pain  ankles and hips  . PCOS (polycystic ovarian syndrome)   . Pneumonia    3-4 yrs ago   . Pre-diabetes   . Vision abnormalities   . Vitamin D deficiency     PAST SURGICAL HISTORY: Past Surgical History:  Procedure Laterality Date  . CRANIOTOMY N/A 10/31/2014   Procedure: CRANIOTOMY FOR RESECTION OF TUMOR;  Surgeon: Jovita Gamma, MD;  Location: [facility];  Service: Neurosurgery;  Laterality: N/A;  Craniotomy for resection of tumor  . MYOMECTOMY    . WISDOM TOOTH EXTRACTION  2006    SOCIAL HISTORY: Social History  Substance Use Topics  . Smoking status: Never Smoker  . Smokeless tobacco: Never Used  . Alcohol use 0.0 oz/week     Comment: occasional    FAMILY HISTORY: Family History  Problem Relation Age of Onset  . Hypertension Mother   . Diabetes Mother   . Obesity Mother   .  Ovarian cancer    . Breast cancer    . Fibroids      ROS: Review of Systems  Constitutional: Positive for malaise/fatigue.  Gastrointestinal: Negative for nausea and vomiting.  Endo/Heme/Allergies:       Polyphagia    PHYSICAL EXAM: Blood pressure 132/85, pulse 91, temperature 97.7 F (36.5 C), temperature source Oral, height 5\' 4"  (1.626 m), weight (!) 320 lb (145.2 kg), last menstrual period 05/14/2016, SpO2 99 %. Body mass index is 54.93 kg/m. Physical Exam  Constitutional: She is oriented to person, place, and time. She appears well-developed and well-nourished.  Cardiovascular: Normal rate.   Pulmonary/Chest: Effort normal.  Musculoskeletal: Normal range of motion.  Neurological: She is oriented to person, place, and time.  Skin: Skin is warm and dry.  Psychiatric: She has a normal mood and affect. Her behavior is normal.  Vitals reviewed.   RECENT LABS AND TESTS: BMET    Component Value Date/Time   NA 141 05/19/2016 1053   K 4.2 05/19/2016 1053   CL 101 05/19/2016 1053   CO2 25 05/19/2016 1053   GLUCOSE 85 05/19/2016 1053   GLUCOSE 89 09/27/2015 1010   BUN 10 05/19/2016 1053   CREATININE 0.87 05/19/2016 1053   CALCIUM 9.2 05/19/2016 1053   GFRNONAA 86 05/19/2016 1053   GFRAA 99 05/19/2016 1053   Lab Results  Component Value Date   HGBA1C 5.8 (H) 05/19/2016   HGBA1C 6.1 09/27/2015   HGBA1C 5.8 09/19/2014   No results found for: INSULIN CBC    Component Value Date/Time   WBC 7.5 05/19/2016 1053   WBC 8.1 09/27/2015 1010   RBC 4.50 05/19/2016 1053   RBC 4.23 09/27/2015 1010   HGB 12.5 09/27/2015 1010   HCT 41.6 05/19/2016 1053   PLT 352 05/19/2016 1053   MCV 92 05/19/2016 1053   MCH 30.0 05/19/2016 1053   MCH 28.8 11/29/2014 0423   MCHC 32.5 05/19/2016 1053   MCHC 32.8 09/27/2015 1010   RDW 16.1 (H) 05/19/2016 1053   LYMPHSABS 3.1 05/19/2016 1053   MONOABS 0.4 11/23/2014 1339   EOSABS 0.2 05/19/2016 1053   BASOSABS 0.0 05/19/2016 1053    Iron/TIBC/Ferritin/ %Sat    Component Value Date/Time   IRON 63 05/19/2016 1105   TIBC 343 05/19/2016 1105   FERRITIN 34 05/19/2016 1105   IRONPCTSAT 18 05/19/2016 1105   Lipid Panel     Component Value Date/Time   CHOL 196 05/19/2016 1235   TRIG 104 05/19/2016 1235   HDL 72 05/19/2016 1235   CHOLHDL 3 09/27/2015  1010   VLDL 21.6 09/27/2015 1010   LDLCALC 103 (H) 05/19/2016 1235   Hepatic Function Panel     Component Value Date/Time   PROT 7.1 05/19/2016 1053   ALBUMIN 3.7 05/19/2016 1053   AST 23 05/19/2016 1053   ALT 33 (H) 05/19/2016 1053   ALKPHOS 84 05/19/2016 1053   BILITOT 0.3 05/19/2016 1053      Component Value Date/Time   TSH 1.420 05/19/2016 1053   TSH 0.239 (L) 11/26/2014 1325    ASSESSMENT AND PLAN: Vitamin D deficiency - Plan: Cholecalciferol (VITAMIN D3) 50000 units CAPS  Morbid obesity (HCC)  PLAN:  Vitamin D Deficiency Amanda Murphy was informed that low vitamin D levels contributes to fatigue and are associated with obesity, breast, and colon cancer. She agrees to continue to take prescription Vit D @50 ,000 IU  every week and will follow up for routine testing of vitamin D, at least 2-3 times per year. Will refill x 1 month. She was informed of the risk of over-replacement of vitamin D and agrees to not increase her dose unless she discusses this with [place] first.   Obesity Amanda Murphy is currently in the action stage of change. As such, her goal is to continue with weight loss efforts She has agreed to follow the category 3 plan but journal dinner with 400-550 calories and 30+ gm protein. Raylene has been instructed to continue walking 45 minutes 2 times per week as she has been doing for weight loss and overall health benefits. We discussed the following Behavioral Modification Stratagies today: increasing lean protein intake and work on meal planning and easy cooking plans   Yajahira has agreed to follow up with our clinic in 2 weeks. She was  informed of the importance of frequent follow up visits to maximize her success with intensive lifestyle modifications for her multiple health conditions.  I, Doreene Nest, am acting as scribe for Dennard Nip, MD  I have reviewed the above documentation for accuracy and completeness, and I agree with the above. -Dennard Nip, MD

## 2016-06-19 MED FILL — VIT D3-50 50,000 UNITS CAPS: 1.25 MG | 28 days supply | Qty: 4 | Fill #0

## 2016-06-29 MED FILL — BYDUREON 2 MG PEN INJECT: 2 | 28 days supply | Qty: 4 | Fill #6

## 2016-06-30 ENCOUNTER — Encounter (INDEPENDENT_AMBULATORY_CARE_PROVIDER_SITE_OTHER): Payer: Self-pay | Admitting: Family Medicine

## 2016-06-30 ENCOUNTER — Ambulatory Visit (INDEPENDENT_AMBULATORY_CARE_PROVIDER_SITE_OTHER): Payer: 59 | Admitting: Family Medicine

## 2016-06-30 VITALS — BP 134/82 | HR 94 | Temp 98.1°F | Ht 64.0 in | Wt 320.0 lb

## 2016-06-30 DIAGNOSIS — E559 Vitamin D deficiency, unspecified: Secondary | ICD-10-CM

## 2016-06-30 DIAGNOSIS — R7303 Prediabetes: Secondary | ICD-10-CM | POA: Diagnosis not present

## 2016-06-30 DIAGNOSIS — Z9189 Other specified personal risk factors, not elsewhere classified: Secondary | ICD-10-CM | POA: Diagnosis not present

## 2016-06-30 MED ORDER — VITAMIN D (ERGOCALCIFEROL) 1.25 MG (50000 UNIT) PO CAPS: 50000.0000 [IU] | ORAL_CAPSULE | ORAL | 0 refills | Status: DC

## 2016-06-30 NOTE — Progress Notes (Addendum)
Office: [phone]  /  Fax: [phone]   HPI:   Chief Complaint: OBESITY Amanda Murphy is here to discuss her progress with her obesity treatment plan. She is following her eating plan approximately 75 % of the time and states she is exercising 0 minutes 0 times per week. Amanda Murphy is currently struggling with increase in snacking, planning meals ahead of time and increase in eating out.   Her weight is (!) 320 lb (145.2 kg) today and has done well with maintaining weight over the last 2 weeks since her last visit. She has lost 0 lbs since starting treatment with [place].  Pre-Diabetes Amanda Murphy has a diagnosis of prediabetes based on her elevated HgA1c and was informed this puts her at greater risk of developing diabetes. She is not taking metformin currently and continues to work on diet and exercise to decrease risk of diabetes. She denies nausea or hypoglycemia. Patient struggles to decrease simple carbs and lose weight, her last A1C greater at 5.8 with mild polyphagia.    Vitamin D deficiency Amanda Murphy has a diagnosis of vitamin D deficiency. She is not currently taking vit D and denies nausea, vomiting or muscle weakness. She started on vitamin D but not yet reached her goal.      Wt Readings from Last 500 Encounters:  06/30/16 (!) 320 lb (145.2 kg)  06/16/16 (!) 320 lb (145.2 kg)  05/28/16 (!) 320 lb 12.8 oz (145.5 kg)  05/19/16 (!) 324 lb 6.4 oz (147.1 kg)  12/27/15 (!) 317 lb (143.8 kg)  11/11/15 (!) 318 lb (144.2 kg)  09/27/15 (!) 314 lb 1.9 oz (142.5 kg)  07/31/15 (!) 307 lb 12.8 oz (139.6 kg)  05/15/15 298 lb (135.2 kg)  01/07/15 272 lb (123.4 kg)  12/25/14 268 lb (121.6 kg)  11/29/14 252 lb 8 oz (114.5 kg)  10/31/14 244 lb 0.8 oz (110.7 kg)  10/19/14 244 lb 11.2 oz (111 kg)  10/19/14 244 lb (110.7 kg)  09/19/14 241 lb 6.4 oz (109.5 kg)  08/16/14 238 lb (108 kg)  05/14/14 240 lb 12.8 oz (109.2 kg)  02/07/14 236 lb (107 kg)  01/18/14 240 lb (108.9 kg)      ALLERGIES: No Known Allergies  MEDICATIONS: Current Outpatient Prescriptions on File Prior to Visit  Medication Sig Dispense Refill  . acetaminophen (TYLENOL) 500 MG tablet Take 1,000 mg by mouth every 6 (six) hours as needed for mild pain.    [name] ALPRAZolam (XANAX) 0.5 MG tablet Take 1 tablet (0.5 mg total) by mouth at bedtime as needed for anxiety. 20 tablet 0  . aspirin EC 325 MG tablet Take 325 mg by mouth daily.    . calcium carbonate (TUMS - DOSED IN MG ELEMENTAL CALCIUM) 500 MG chewable tablet Chew 1 tablet by mouth daily. Reported on 07/31/2015    . Cholecalciferol (VITAMIN D3) 50000 units CAPS One tab weekly 4 capsule 0  . Cyanocobalamin (VITAMIN B 12 PO) Take 1 tablet by mouth as needed. Reported on 07/31/2015    . Exenatide ER 2 MG PEN Inject 2 mg into the skin once a week. 4 each 11  . levocetirizine (XYZAL) 5 MG tablet Take 1 tablet (5 mg total) by mouth every evening. 30 tablet 11  . Multiple Vitamin (MULTIVITAMIN WITH MINERALS) TABS tablet Take 1 tablet by mouth daily. Reported on 07/31/2015    . Multiple Vitamins-Minerals (EYE VITAMINS) CAPS Take by mouth. Take 3 daily with food    . Prenatal Vit-Fe Fumarate-FA (PRENATAL MULTIVITAMIN) TABS tablet Take  1 tablet by mouth daily at 12 noon.     No current facility-administered medications on file prior to visit.     PAST MEDICAL HISTORY: Past Medical History:  Diagnosis Date  . Anemia   . Asthma    as child   . Astigmatism   . Fibroids   . Headache(784.0)   . Hx of blood clots   . Joint pain    ankles and hips  . PCOS (polycystic ovarian syndrome)   . Pneumonia    3-4 yrs ago   . Pre-diabetes   . Vision abnormalities   . Vitamin D deficiency     PAST SURGICAL HISTORY: Past Surgical History:  Procedure Laterality Date  . CRANIOTOMY N/A 10/31/2014   Procedure: CRANIOTOMY FOR RESECTION OF TUMOR;  Surgeon: Jovita Gamma, MD;  Location: [facility];  Service: Neurosurgery;  Laterality: N/A;  Craniotomy for  resection of tumor  . MYOMECTOMY    . WISDOM TOOTH EXTRACTION  2006    SOCIAL HISTORY: Social History  Substance Use Topics  . Smoking status: Never Smoker  . Smokeless tobacco: Never Used  . Alcohol use 0.0 oz/week     Comment: occasional    FAMILY HISTORY: Family History  Problem Relation Age of Onset  . Hypertension Mother   . Diabetes Mother   . Obesity Mother   . Ovarian cancer    . Breast cancer    . Fibroids      ROS: Review of Systems  Endo/Heme/Allergies:       Mild Polyphagia  All other systems reviewed and are negative.   PHYSICAL EXAM: Blood pressure 134/82, pulse 94, temperature 98.1 F (36.7 C), temperature source Oral, height 5\' 4"  (1.626 m), weight (!) 320 lb (145.2 kg), SpO2 98 %. Body mass index is 54.93 kg/m. Physical Exam  Constitutional: She is oriented to person, place, and time. She appears well-developed and well-nourished.  HENT:  Head: Normocephalic and atraumatic.  Eyes: EOM are normal.  Neck: Normal range of motion. Neck supple.  Cardiovascular: Normal rate.   Pulmonary/Chest: Effort normal.  Musculoskeletal: Normal range of motion.  Neurological: She is alert and oriented to person, place, and time.  Skin: Skin is warm and dry.  Psychiatric: She has a normal mood and affect. Her behavior is normal.  Vitals reviewed.   RECENT LABS AND TESTS: BMET    Component Value Date/Time   NA 141 05/19/2016 1053   K 4.2 05/19/2016 1053   CL 101 05/19/2016 1053   CO2 25 05/19/2016 1053   GLUCOSE 85 05/19/2016 1053   GLUCOSE 89 09/27/2015 1010   BUN 10 05/19/2016 1053   CREATININE 0.87 05/19/2016 1053   CALCIUM 9.2 05/19/2016 1053   GFRNONAA 86 05/19/2016 1053   GFRAA 99 05/19/2016 1053   Lab Results  Component Value Date   HGBA1C 5.8 (H) 05/19/2016   HGBA1C 6.1 09/27/2015   HGBA1C 5.8 09/19/2014   No results found for: INSULIN CBC    Component Value Date/Time   WBC 7.5 05/19/2016 1053   WBC 8.1 09/27/2015 1010   RBC 4.50  05/19/2016 1053   RBC 4.23 09/27/2015 1010   HGB 12.5 09/27/2015 1010   HCT 41.6 05/19/2016 1053   PLT 352 05/19/2016 1053   MCV 92 05/19/2016 1053   MCH 30.0 05/19/2016 1053   MCH 28.8 11/29/2014 0423   MCHC 32.5 05/19/2016 1053   MCHC 32.8 09/27/2015 1010   RDW 16.1 (H) 05/19/2016 1053   LYMPHSABS 3.1 05/19/2016  1053   MONOABS 0.4 11/23/2014 1339   EOSABS 0.2 05/19/2016 1053   BASOSABS 0.0 05/19/2016 1053   Iron/TIBC/Ferritin/ %Sat    Component Value Date/Time   IRON 63 05/19/2016 1105   TIBC 343 05/19/2016 1105   FERRITIN 34 05/19/2016 1105   IRONPCTSAT 18 05/19/2016 1105   Lipid Panel     Component Value Date/Time   CHOL 196 05/19/2016 1235   TRIG 104 05/19/2016 1235   HDL 72 05/19/2016 1235   CHOLHDL 3 09/27/2015 1010   VLDL 21.6 09/27/2015 1010   LDLCALC 103 (H) 05/19/2016 1235   Hepatic Function Panel     Component Value Date/Time   PROT 7.1 05/19/2016 1053   ALBUMIN 3.7 05/19/2016 1053   AST 23 05/19/2016 1053   ALT 33 (H) 05/19/2016 1053   ALKPHOS 84 05/19/2016 1053   BILITOT 0.3 05/19/2016 1053      Component Value Date/Time   TSH 1.420 05/19/2016 1053   TSH 0.239 (L) 11/26/2014 1325    ASSESSMENT AND PLAN: Prediabetes  Vitamin D deficiency  Morbid obesity ([place])  PLAN: Pre-Diabetes Amanda Murphy will continue to work on weight loss, exercise, and decreasing simple carbohydrates in her diet to help decrease the risk of diabetes. We dicussed metformin including benefits and risks. She was informed that eating too many simple carbohydrates or too many calories at one sitting increases the likelihood of GI side effects. Amanda Murphy declined metformin for now and a prescription was not written today. She wants to work on diet and exercise.  Amanda Murphy agreed to follow up with [place] as directed to monitor her progress.   Diabetes risk counselling Amanda Murphy was given extended (at least 15 minutes) diabetes prevention counseling today. She is 36 y.o. female and has  risk factors for diabetes including obesity. We discussed intensive lifestyle modifications today with an emphasis on weight loss as well as increasing exercise and decreasing simple carbohydrates in her diet.  Vitamin D Deficiency Amanda Murphy was informed that low vitamin D levels contributes to fatigue and are associated with obesity, breast, and colon cancer. She agrees to continue to take prescription Vit D @50 ,000 IU every week and will follow up for routine testing of vitamin D, at least 2-3 times per year. She was informed of the risk of over-replacement of vitamin D and agrees to not increase her dose unless he discusses this with [place] first. Will recheck labs in 2 months and follow.   Obesity Amanda Murphy may not currently be in the action stage of change. As such, her goal is to continue with weight loss efforts She has agreed to follow the Category 3 plan Amanda Murphy has been instructed to work up to a goal of 150 minutes of combined cardio and strengthening exercise per week for weight loss and overall health benefits. We discussed the following Behavioral Modification Stratagies today: increasing lean protein intake, decreasing simple carbohydrates , increasing vegetables, decrease eating out, work on meal planning and easy cooking plans and dealing with family or coworker sabotage Amanda Murphy has agreed to follow up with our clinic in 2 weeks. She was informed of the importance of frequent follow up visits to maximize her success with intensive lifestyle modifications for her multiple health conditions.  I, April Moore , am acting as Education administrator for Dennard Nip, MD  I have reviewed the above documentation for accuracy and completeness, and I agree with the above. -Dennard Nip, MD

## 2016-07-01 ENCOUNTER — Encounter: Payer: Self-pay | Admitting: Internal Medicine

## 2016-07-01 ENCOUNTER — Ambulatory Visit (INDEPENDENT_AMBULATORY_CARE_PROVIDER_SITE_OTHER): Payer: 59 | Admitting: Internal Medicine

## 2016-07-01 VITALS — BP 120/70 | HR 98 | Temp 98.4°F | Resp 14 | Ht 64.0 in | Wt 320.0 lb

## 2016-07-01 DIAGNOSIS — R0683 Snoring: Secondary | ICD-10-CM | POA: Diagnosis not present

## 2016-07-01 DIAGNOSIS — Z0001 Encounter for general adult medical examination with abnormal findings: Secondary | ICD-10-CM

## 2016-07-01 DIAGNOSIS — Z0000 Encounter for general adult medical examination without abnormal findings: Secondary | ICD-10-CM

## 2016-07-01 NOTE — Progress Notes (Signed)
   Subjective:    Patient ID: Amanda Murphy, female    DOB: July 14, 1980, 36 y.o.   MRN: [id number]  HPI The patient is a 36 YO female coming in for wellness.   PMH, [facility], social history reviewed and updated.   Review of Systems  Constitutional: Positive for activity change and appetite change. Negative for diaphoresis, fatigue and fever.  HENT: Negative.   Eyes: Negative.   Respiratory: Negative for cough, chest tightness and shortness of breath.   Cardiovascular: Negative for chest pain, palpitations and leg swelling.  Gastrointestinal: Negative for abdominal distention, abdominal pain, constipation, diarrhea, nausea and vomiting.  Musculoskeletal: Negative.   Skin: Negative.   Neurological: Negative.   Psychiatric/Behavioral: Negative.       Objective:   Physical Exam  Constitutional: She is oriented to person, place, and time. She appears well-developed and well-nourished.  HENT:  Head: Normocephalic and atraumatic.  Eyes: EOM are normal.  Neck: Normal range of motion.  Cardiovascular: Normal rate and regular rhythm.   Pulmonary/Chest: Effort normal and breath sounds normal. No respiratory distress. She has no wheezes. She has no rales.  Abdominal: Soft. Bowel sounds are normal. She exhibits no distension. There is no tenderness. There is no rebound.  Musculoskeletal: She exhibits no edema.  Neurological: She is alert and oriented to person, place, and time. Coordination normal.  Skin: Skin is warm and dry.  Psychiatric: She has a normal mood and affect.   Vitals:   07/01/16 0815  BP: 120/70  Pulse: 98  Resp: 14  Temp: 98.4 F (36.9 C)  TempSrc: Oral  SpO2: 100%  Weight: (!) 320 lb (145.2 kg)  Height: 5\' 4"  (1.626 m)      Assessment & Plan:

## 2016-07-01 NOTE — Patient Instructions (Signed)
We will get the sleep test done at home. You will get a call back about that.   Get back into the exercise.   Health Maintenance, Female Introduction Adopting a healthy lifestyle and getting preventive care can go a long way to promote health and wellness. Talk with your health care provider about what schedule of regular examinations is right for you. This is a good chance for you to check in with your provider about disease prevention and staying healthy. In between checkups, there are plenty of things you can do on your own. Experts have done a lot of research about which lifestyle changes and preventive measures are most likely to keep you healthy. Ask your health care provider for more information. Weight and diet Eat a healthy diet  Be sure to include plenty of vegetables, fruits, low-fat dairy products, and lean protein.  Do not eat a lot of foods high in solid fats, added sugars, or salt.  Get regular exercise. This is one of the most important things you can do for your health.  Most adults should exercise for at least 150 minutes each week. The exercise should increase your heart rate and make you sweat (moderate-intensity exercise).  Most adults should also do strengthening exercises at least twice a week. This is in addition to the moderate-intensity exercise. Maintain a healthy weight  Body mass index (BMI) is a measurement that can be used to identify possible weight problems. It estimates body fat based on height and weight. Your health care provider can help determine your BMI and help you achieve or maintain a healthy weight.  For females 62 years of age and older:  A BMI below 18.5 is considered underweight.  A BMI of 18.5 to 24.9 is normal.  A BMI of 25 to 29.9 is considered overweight.  A BMI of 30 and above is considered obese. Watch levels of cholesterol and blood lipids  You should start having your blood tested for lipids and cholesterol at 37 years of age,  then have this test every 5 years.  You may need to have your cholesterol levels checked more often if:  Your lipid or cholesterol levels are high.  You are older than 37 years of age.  You are at high risk for heart disease. Cancer screening Lung Cancer  Lung cancer screening is recommended for adults 69-60 years old who are at high risk for lung cancer because of a history of smoking.  A yearly low-dose CT scan of the lungs is recommended for people who:  Currently smoke.  Have quit within the past 15 years.  Have at least a 30-pack-year history of smoking. A pack year is smoking an average of one pack of cigarettes a day for 1 year.  Yearly screening should continue until it has been 15 years since you quit.  Yearly screening should stop if you develop a health problem that would prevent you from having lung cancer treatment. Breast Cancer  Practice breast self-awareness. This means understanding how your breasts normally appear and feel.  It also means doing regular breast self-exams. Let your health care provider know about any changes, no matter how small.  If you are in your 20s or 30s, you should have a clinical breast exam (CBE) by a health care provider every 1-3 years as part of a regular health exam.  If you are 43 or older, have a CBE every year. Also consider having a breast X-ray (mammogram) every year.  If you  have a family history of breast cancer, talk to your health care provider about genetic screening.  If you are at high risk for breast cancer, talk to your health care provider about having an MRI and a mammogram every year.  Breast cancer gene (BRCA) assessment is recommended for women who have family members with BRCA-related cancers. BRCA-related cancers include:  Breast.  Ovarian.  Tubal.  Peritoneal cancers.  Results of the assessment will determine the need for genetic counseling and BRCA1 and BRCA2 testing. Cervical Cancer  Your health  care provider may recommend that you be screened regularly for cancer of the pelvic organs (ovaries, uterus, and vagina). This screening involves a pelvic examination, including checking for microscopic changes to the surface of your cervix (Pap test). You may be encouraged to have this screening done every 3 years, beginning at age 40.  For women ages 47-65, health care providers may recommend pelvic exams and Pap testing every 3 years, or they may recommend the Pap and pelvic exam, combined with testing for human papilloma virus (HPV), every 5 years. Some types of HPV increase your risk of cervical cancer. Testing for HPV may also be done on women of any age with unclear Pap test results.  Other health care providers may not recommend any screening for nonpregnant women who are considered low risk for pelvic cancer and who do not have symptoms. Ask your health care provider if a screening pelvic exam is right for you.  If you have had past treatment for cervical cancer or a condition that could lead to cancer, you need Pap tests and screening for cancer for at least 20 years after your treatment. If Pap tests have been discontinued, your risk factors (such as having a new sexual partner) need to be reassessed to determine if screening should resume. Some women have medical problems that increase the chance of getting cervical cancer. In these cases, your health care provider may recommend more frequent screening and Pap tests. Colorectal Cancer  This type of cancer can be detected and often prevented.  Routine colorectal cancer screening usually begins at 37 years of age and continues through 37 years of age.  Your health care provider may recommend screening at an earlier age if you have risk factors for colon cancer.  Your health care provider may also recommend using home test kits to check for hidden blood in the stool.  A small camera at the end of a tube can be used to examine your colon  directly (sigmoidoscopy or colonoscopy). This is done to check for the earliest forms of colorectal cancer.  Routine screening usually begins at age 51.  Direct examination of the colon should be repeated every 5-10 years through 37 years of age. However, you may need to be screened more often if early forms of precancerous polyps or small growths are found. Skin Cancer  Check your skin from head to toe regularly.  Tell your health care provider about any new moles or changes in moles, especially if there is a change in a mole's shape or color.  Also tell your health care provider if you have a mole that is larger than the size of a pencil eraser.  Always use sunscreen. Apply sunscreen liberally and repeatedly throughout the day.  Protect yourself by wearing long sleeves, pants, a wide-brimmed hat, and sunglasses whenever you are outside. Heart disease, diabetes, and high blood pressure  High blood pressure causes heart disease and increases the risk of stroke.  High blood pressure is more likely to develop in:  People who have blood pressure in the high end of the normal range (130-139/85-89 mm Hg).  People who are overweight or obese.  People who are African American.  If you are 66-20 years of age, have your blood pressure checked every 3-5 years. If you are 49 years of age or older, have your blood pressure checked every year. You should have your blood pressure measured twice-once when you are at a hospital or clinic, and once when you are not at a hospital or clinic. Record the average of the two measurements. To check your blood pressure when you are not at a hospital or clinic, you can use:  An automated blood pressure machine at a pharmacy.  A home blood pressure monitor.  If you are between 68 years and 66 years old, ask your health care provider if you should take aspirin to prevent strokes.  Have regular diabetes screenings. This involves taking a blood sample to check  your fasting blood sugar level.  If you are at a normal weight and have a low risk for diabetes, have this test once every three years after 37 years of age.  If you are overweight and have a high risk for diabetes, consider being tested at a younger age or more often. Preventing infection Hepatitis B  If you have a higher risk for hepatitis B, you should be screened for this virus. You are considered at high risk for hepatitis B if:  You were born in a country where hepatitis B is common. Ask your health care provider which countries are considered high risk.  Your parents were born in a high-risk country, and you have not been immunized against hepatitis B (hepatitis B vaccine).  You have HIV or AIDS.  You use needles to inject street drugs.  You live with someone who has hepatitis B.  You have had sex with someone who has hepatitis B.  You get hemodialysis treatment.  You take certain medicines for conditions, including cancer, organ transplantation, and autoimmune conditions. Hepatitis C  Blood testing is recommended for:  Everyone born from [birth year] through 1966.  Anyone with known risk factors for hepatitis C. Sexually transmitted infections (STIs)  You should be screened for sexually transmitted infections (STIs) including gonorrhea and chlamydia if:  You are sexually active and are younger than 37 years of age.  You are older than 37 years of age and your health care provider tells you that you are at risk for this type of infection.  Your sexual activity has changed since you were last screened and you are at an increased risk for chlamydia or gonorrhea. Ask your health care provider if you are at risk.  If you do not have HIV, but are at risk, it may be recommended that you take a prescription medicine daily to prevent HIV infection. This is called pre-exposure prophylaxis (PrEP). You are considered at risk if:  You are sexually active and do not regularly use  condoms or know the HIV status of your partner(s).  You take drugs by injection.  You are sexually active with a partner who has HIV. Talk with your health care provider about whether you are at high risk of being infected with HIV. If you choose to begin PrEP, you should first be tested for HIV. You should then be tested every 3 months for as long as you are taking PrEP. Pregnancy  If you are premenopausal and  you may become pregnant, ask your health care provider about preconception counseling.  If you may become pregnant, take 400 to 800 micrograms (mcg) of folic acid every day.  If you want to prevent pregnancy, talk to your health care provider about birth control (contraception). Osteoporosis and menopause  Osteoporosis is a disease in which the bones lose minerals and strength with aging. This can result in serious bone fractures. Your risk for osteoporosis can be identified using a bone density scan.  If you are 75 years of age or older, or if you are at risk for osteoporosis and fractures, ask your health care provider if you should be screened.  Ask your health care provider whether you should take a calcium or vitamin D supplement to lower your risk for osteoporosis.  Menopause may have certain physical symptoms and risks.  Hormone replacement therapy may reduce some of these symptoms and risks. Talk to your health care provider about whether hormone replacement therapy is right for you. Follow these instructions at home:  Schedule regular health, dental, and eye exams.  Stay current with your immunizations.  Do not use any tobacco products including cigarettes, chewing tobacco, or electronic cigarettes.  If you are pregnant, do not drink alcohol.  If you are breastfeeding, limit how much and how often you drink alcohol.  Limit alcohol intake to no more than 1 drink per day for nonpregnant women. One drink equals 12 ounces of beer, 5 ounces of wine, or 1 ounces of  hard liquor.  Do not use street drugs.  Do not share needles.  Ask your health care provider for help if you need support or information about quitting drugs.  Tell your health care provider if you often feel depressed.  Tell your health care provider if you have ever been abused or do not feel safe at home. This information is not intended to replace advice given to you by your health care provider. Make sure you discuss any questions you have with your health care provider. Document Released: 12/08/2010 Document Revised: 10/31/2015 Document Reviewed: 02/26/2015  2017 [employer]   Generic Ankle Exercises EXERCISES RANGE OF MOTION (ROM) AND STRETCHING EXERCISES  These exercises may help you when beginning to rehabilitate your injury. Your symptoms may resolve with or without further involvement from your physician, physical therapist or athletic trainer. While completing these exercises, remember:   Restoring tissue flexibility helps normal motion to return to the joints. This allows healthier, less painful movement and activity.  An effective stretch should be held for at least 30 seconds.  A stretch should never be painful. You should only feel a gentle lengthening or release in the stretched tissue. RANGE OF MOTION - Dorsi/Plantar Flexion   While sitting with your right / left knee straight, draw the top of your foot upwards by flexing your ankle. Then reverse the motion, pointing your toes downward.  Hold each position for __________ seconds.  After completing your first set of exercises, repeat this exercise with your knee bent. Repeat __________ times. Complete this exercise __________ times per day.  RANGE OF MOTION - Ankle Alphabet   Imagine your right / left big toe is a pen.  Keeping your hip and knee still, write out the entire alphabet with your "pen." Make the letters as large as you can without increasing any discomfort. Repeat __________ times. Complete this  exercise __________ times per day.  RANGE OF MOTION - Ankle Dorsiflexion, Active Assisted   Remove shoes and sit on  a chair that is preferably not on a carpeted surface.  Place right / left foot under knee. Extend your opposite leg for support.  Keeping your heel down, slide your right / left foot back toward the chair until you feel a stretch at your ankle or calf. If you do not feel a stretch, slide your bottom forward to the edge of the chair while still keeping your heel down.  Hold this stretch for __________ seconds. Repeat __________ times. Complete this stretch __________ times per day.  STRENGTHENING EXERCISES  These exercises may help you when beginning to rehabilitate your injury. They may resolve your symptoms with or without further involvement from your physician, physical therapist or athletic trainer. While completing these exercises, remember:   Muscles can gain both the endurance and the strength needed for everyday activities through controlled exercises.  Complete these exercises as instructed by your physician, physical therapist or athletic trainer. Progress the resistance and repetitions only as guided.  You may experience muscle soreness or fatigue, but the pain or discomfort you are trying to eliminate should never worsen during these exercises. If this pain does worsen, stop and make certain you are following the directions exactly. If the pain is still present after adjustments, discontinue the exercise until you can discuss the trouble with your clinician. STRENGTH - Dorsiflexors   Secure a rubber exercise band/tubing to a fixed object (table, pole) and loop the other end around your right / left foot.  Sit on the floor facing the fixed object. The band/tubing should be slightly tense when your foot is relaxed.  Slowly draw your foot back toward you using your ankle and toes.  Hold this position for __________ seconds. Slowly release the tension in the band and  return your foot to the starting position. Repeat __________ times. Complete this exercise __________ times per day.  STRENGTH - Plantar-flexors   Sit with your right / left leg extended. Holding onto both ends of a rubber exercise band/tubing, loop it around the ball of your foot. Keep a slight tension in the band.  Slowly push your toes away from you, pointing them downward.  Hold this position for __________ seconds. Return slowly, controlling the tension in the band/tubing. Repeat __________ times. Complete this exercise __________ times per day.  STRENGTH - Ankle Eversion   Secure one end of a rubber exercise band/tubing to a fixed object (table, pole). Loop the other end around your foot just before your toes.  Place your fists between your knees. This will focus your strengthening at your ankle.  Drawing the band/tubing across your opposite foot, slowly, pull your little toe out and up. Make sure the band/tubing is positioned to resist the entire motion.  Hold this position for __________ seconds.  Have your muscles resist the band/tubing as it slowly pulls your foot back to the starting position. Repeat __________ times. Complete this exercise __________ times per day.  STRENGTH - Ankle Inversion   Secure one end of a rubber exercise band/tubing to a fixed object (table, pole). Loop the other end around your foot just before your toes.  Place your fists between your knees. This will focus your strengthening at your ankle.  Slowly, pull your big toe up and in, making sure the band/tubing is positioned to resist the entire motion.  Hold this position for __________ seconds.  Have your muscles resist the band/tubing as it slowly pulls your foot back to the starting position. Repeat __________ times. Complete this exercises  __________ times per day.  STRENGTH - Towel Curls   Sit in a chair positioned on a non-carpeted surface.  Place your foot on a towel, keeping your heel  on the floor.  Pull the towel toward your heel by only curling your toes. Keep your heel on the floor. If instructed by your physician, physical therapist or athletic trainer, add weight to the end of the towel. Repeat __________ times. Complete this exercise __________ times per day.  STRENGTH - Plantar-flexors, Standing   Stand with your feet shoulder width apart. Steady yourself with a wall or table using as little support as needed.  Keeping your weight evenly spread over the width of your feet, rise up on your toes.*  Hold this position for __________ seconds. Repeat __________ times. Complete this exercise __________ times per day.  *If this is too easy, shift your weight toward your right / left leg until you feel challenged. Ultimately, you may be asked to do this exercise with your right / left foot only.  BALANCE - Tandem Walking   Place your uninjured foot on a line 2-4 inches wide and at least 10 feet long.  Keeping your balance without using anything for extra support, place your right / left heel directly in front of your other foot.  Slowly raise your back foot up, lifting from the heel to the toes, and place it directly in front of the right / left foot.  Continue to walk along the line slowly. Walk for ____________________ feet. Repeat ____________________ times. Complete ____________________ times per day. This information is not intended to replace advice given to you by your health care provider. Make sure you discuss any questions you have with your health care provider. Document Released: 04/08/2005 Document Revised: 06/15/2014 Document Reviewed: 02/10/2015 [employer] Interactive Patient Education  2017 [employer].

## 2016-07-01 NOTE — Progress Notes (Signed)
Pre visit review using our clinic review tool, if applicable. No additional management support is needed unless otherwise documented below in the visit note. 

## 2016-07-03 NOTE — Assessment & Plan Note (Signed)
Working with the weight clinic to improve her diet and motivation to lose weight.

## 2016-07-03 NOTE — Assessment & Plan Note (Signed)
Ordered home sleep test due to risk of OSA (snores and morbid obesity). Flu shot up to date. Pap smear and tdap up to date. Declines hiv testing.

## 2016-07-14 ENCOUNTER — Ambulatory Visit (INDEPENDENT_AMBULATORY_CARE_PROVIDER_SITE_OTHER): Payer: 59 | Admitting: Family Medicine

## 2016-07-16 ENCOUNTER — Ambulatory Visit (INDEPENDENT_AMBULATORY_CARE_PROVIDER_SITE_OTHER): Payer: 59 | Admitting: Family Medicine

## 2016-07-16 DIAGNOSIS — Z9189 Other specified personal risk factors, not elsewhere classified: Secondary | ICD-10-CM | POA: Diagnosis not present

## 2016-07-16 DIAGNOSIS — E559 Vitamin D deficiency, unspecified: Secondary | ICD-10-CM | POA: Diagnosis not present

## 2016-07-16 DIAGNOSIS — R7303 Prediabetes: Secondary | ICD-10-CM

## 2016-07-16 DIAGNOSIS — F3289 Other specified depressive episodes: Secondary | ICD-10-CM | POA: Diagnosis not present

## 2016-07-16 MED ORDER — VITAMIN D (ERGOCALCIFEROL) 1.25 MG (50000 UNIT) PO CAPS: 50000.0000 [IU] | ORAL_CAPSULE | ORAL | 0 refills | Status: DC

## 2016-07-16 MED ORDER — BUPROPION HCL ER (SR) 150 MG PO TB12: 150.0000 mg | ORAL_TABLET | Freq: Two times a day (BID) | ORAL | 0 refills | Status: DC

## 2016-07-16 MED FILL — BUPROPION SR 150 MG TABLET: 150 | 15 days supply | Qty: 30 | Fill #0

## 2016-07-16 MED FILL — VIT D2 1.25 MG (50,000 UNIT: 1.25 MG | 28 days supply | Qty: 4 | Fill #0

## 2016-07-16 NOTE — Progress Notes (Signed)
Office: [phone]  /  Fax: [phone]   HPI:   Chief Complaint: OBESITY Amanda Murphy is here to discuss her progress with her obesity treatment plan. She is following her eating plan approximately 50 % of the time and states she is walking 3 times per week. Amanda Murphy is currently struggling with following the plan closely. She notes increased emotional eating, cravings and snacking while suffering with PMS last week. She has gained weight since her last visit. Her weight is (!) 322 lb (146.1 kg) today and has had a weight gain of 2 lbs over a period of 2 weeks since her last visit. She has lost 2 lbs since starting treatment with [place].  Vitamin D deficiency Amanda Murphy has a diagnosis of vitamin D deficiency. She is currently taking vit D and she is not yet at goal. She admits fatigue and denies nausea, vomiting or muscle weakness.  Depression with emotional eating behaviors Amanda Murphy is struggling with increased emotional eating and using food for comfort to the extent that it is negatively impacting her health. She often snacks when she is not hungry. Amanda Murphy sometimes feels she is out of control and then feels guilty that she made poor food choices. She has been working on behavior modification techniques to help reduce her emotional eating and has been minimally successful. She is frustrated with weight loss efforts and is using food for comfort. She shows no sign of suicidal or homicidal ideations.  Pre-Diabetes Amanda Murphy has a diagnosis of prediabetes based on her elevated Hgb A1c at 5.8 and was informed this puts her at greater risk of developing diabetes. She is currently taking Bydureon that is prescribed by her PCP and continues to work on diet and exercise to decrease risk of diabetes. She denies abdominal pain, nausea or hypoglycemia.  At risk for diabetes Amanda Murphy is at higher than average risk for developing diabetes due to her obesity. She currently denies polyuria or  polydipsia.   Depression screen [facility] 2/9 05/19/2016  Decreased Interest 2  Down, Depressed, Hopeless 1  PHQ - 2 Score 3  Altered sleeping 1  Tired, decreased energy 3  Change in appetite 3  Feeling bad or failure about yourself  2  Trouble concentrating 1  Moving slowly or fidgety/restless 2  Suicidal thoughts 0  PHQ-9 Score 15     Wt Readings from Last 500 Encounters:  07/16/16 (!) 322 lb (146.1 kg)  07/01/16 (!) 320 lb (145.2 kg)  06/30/16 (!) 320 lb (145.2 kg)  06/16/16 (!) 320 lb (145.2 kg)  05/28/16 (!) 320 lb 12.8 oz (145.5 kg)  05/19/16 (!) 324 lb 6.4 oz (147.1 kg)  12/27/15 (!) 317 lb (143.8 kg)  11/11/15 (!) 318 lb (144.2 kg)  09/27/15 (!) 314 lb 1.9 oz (142.5 kg)  07/31/15 (!) 307 lb 12.8 oz (139.6 kg)  05/15/15 298 lb (135.2 kg)  01/07/15 272 lb (123.4 kg)  12/25/14 268 lb (121.6 kg)  11/29/14 252 lb 8 oz (114.5 kg)  10/31/14 244 lb 0.8 oz (110.7 kg)  10/19/14 244 lb 11.2 oz (111 kg)  10/19/14 244 lb (110.7 kg)  09/19/14 241 lb 6.4 oz (109.5 kg)  08/16/14 238 lb (108 kg)  05/14/14 240 lb 12.8 oz (109.2 kg)  02/07/14 236 lb (107 kg)  01/18/14 240 lb (108.9 kg)     ALLERGIES: No Known Allergies  MEDICATIONS: Current Outpatient Prescriptions on File Prior to Visit  Medication Sig Dispense Refill  . acetaminophen (TYLENOL) 500 MG tablet Take 1,000 mg by  mouth every 6 (six) hours as needed for mild pain.    [name] ALPRAZolam (XANAX) 0.5 MG tablet Take 1 tablet (0.5 mg total) by mouth at bedtime as needed for anxiety. 20 tablet 0  . aspirin EC 325 MG tablet Take 325 mg by mouth daily.    . calcium carbonate (TUMS - DOSED IN MG ELEMENTAL CALCIUM) 500 MG chewable tablet Chew 1 tablet by mouth daily. Reported on 07/31/2015    . Cholecalciferol (VITAMIN D3) 50000 units CAPS One tab weekly 4 capsule 0  . Cyanocobalamin (VITAMIN B 12 PO) Take 1 tablet by mouth as needed. Reported on 07/31/2015    . Exenatide ER 2 MG PEN Inject 2 mg into the skin once a week. 4 each 11   . levocetirizine (XYZAL) 5 MG tablet Take 1 tablet (5 mg total) by mouth every evening. 30 tablet 11  . Multiple Vitamins-Minerals (EYE VITAMINS) CAPS Take by mouth. Take 3 daily with food    . Prenatal Vit-Fe Fumarate-FA (PRENATAL MULTIVITAMIN) TABS tablet Take 1 tablet by mouth daily at 12 noon.     No current facility-administered medications on file prior to visit.     PAST MEDICAL HISTORY: Past Medical History:  Diagnosis Date  . Anemia   . Asthma    as child   . Astigmatism   . Fibroids   . Headache(784.0)   . Hx of blood clots   . Joint pain    ankles and hips  . PCOS (polycystic ovarian syndrome)   . Pneumonia    3-4 yrs ago   . Pre-diabetes   . Vision abnormalities   . Vitamin D deficiency     PAST SURGICAL HISTORY: Past Surgical History:  Procedure Laterality Date  . CRANIOTOMY N/A 10/31/2014   Procedure: CRANIOTOMY FOR RESECTION OF TUMOR;  Surgeon: Jovita Gamma, MD;  Location: [place] [facility];  Service: Neurosurgery;  Laterality: N/A;  Craniotomy for resection of tumor  . MYOMECTOMY    . WISDOM TOOTH EXTRACTION  2006    SOCIAL HISTORY: Social History  Substance Use Topics  . Smoking status: Never Smoker  . Smokeless tobacco: Never Used  . Alcohol use 0.0 oz/week     Comment: occasional    FAMILY HISTORY: Family History  Problem Relation Age of Onset  . Hypertension Mother   . Diabetes Mother   . Obesity Mother   . Ovarian cancer    . Breast cancer    . Fibroids      ROS: Review of Systems  Constitutional: Positive for malaise/fatigue.  Gastrointestinal: Negative for abdominal pain, nausea and vomiting.  Genitourinary: Negative for frequency.  Musculoskeletal:       Negative Muscle Weakness  Endo/Heme/Allergies: Negative for polydipsia.       Increased Polyphagia Negative Hypoglycemia  Psychiatric/Behavioral: Positive for depression. Negative for suicidal ideas.    PHYSICAL EXAM: Blood pressure 132/77, pulse 94, temperature 98.1 F  (36.7 C), temperature source Oral, height 5\' 4"  (1.626 m), weight (!) 322 lb (146.1 kg), last menstrual period 06/25/2016, SpO2 100 %. Body mass index is 55.27 kg/m. Physical Exam  Constitutional: She is oriented to person, place, and time. She appears well-developed and well-nourished.  Cardiovascular: Normal rate.   Pulmonary/Chest: Effort normal.  Musculoskeletal: Normal range of motion.  Neurological: She is oriented to person, place, and time.  Skin: Skin is warm and dry.  Psychiatric: She has a normal mood and affect. Her behavior is normal.  Vitals reviewed.   RECENT LABS AND TESTS:  BMET    Component Value Date/Time   NA 141 05/19/2016 1053   K 4.2 05/19/2016 1053   CL 101 05/19/2016 1053   CO2 25 05/19/2016 1053   GLUCOSE 85 05/19/2016 1053   GLUCOSE 89 09/27/2015 1010   BUN 10 05/19/2016 1053   CREATININE 0.87 05/19/2016 1053   CALCIUM 9.2 05/19/2016 1053   GFRNONAA 86 05/19/2016 1053   GFRAA 99 05/19/2016 1053   Lab Results  Component Value Date   HGBA1C 5.8 (H) 05/19/2016   HGBA1C 6.1 09/27/2015   HGBA1C 5.8 09/19/2014   No results found for: INSULIN CBC    Component Value Date/Time   WBC 7.5 05/19/2016 1053   WBC 8.1 09/27/2015 1010   RBC 4.50 05/19/2016 1053   RBC 4.23 09/27/2015 1010   HGB 12.5 09/27/2015 1010   HCT 41.6 05/19/2016 1053   PLT 352 05/19/2016 1053   MCV 92 05/19/2016 1053   MCH 30.0 05/19/2016 1053   MCH 28.8 11/29/2014 0423   MCHC 32.5 05/19/2016 1053   MCHC 32.8 09/27/2015 1010   RDW 16.1 (H) 05/19/2016 1053   LYMPHSABS 3.1 05/19/2016 1053   MONOABS 0.4 11/23/2014 1339   EOSABS 0.2 05/19/2016 1053   BASOSABS 0.0 05/19/2016 1053   Iron/TIBC/Ferritin/ %Sat    Component Value Date/Time   IRON 63 05/19/2016 1105   TIBC 343 05/19/2016 1105   FERRITIN 34 05/19/2016 1105   IRONPCTSAT 18 05/19/2016 1105   Lipid Panel     Component Value Date/Time   CHOL 196 05/19/2016 1235   TRIG 104 05/19/2016 1235   HDL 72 05/19/2016  1235   CHOLHDL 3 09/27/2015 1010   VLDL 21.6 09/27/2015 1010   LDLCALC 103 (H) 05/19/2016 1235   Hepatic Function Panel     Component Value Date/Time   PROT 7.1 05/19/2016 1053   ALBUMIN 3.7 05/19/2016 1053   AST 23 05/19/2016 1053   ALT 33 (H) 05/19/2016 1053   ALKPHOS 84 05/19/2016 1053   BILITOT 0.3 05/19/2016 1053      Component Value Date/Time   TSH 1.420 05/19/2016 1053   TSH 0.239 (L) 11/26/2014 1325    ASSESSMENT AND PLAN: Morbid obesity (HCC)  Other depression - With emotional eating - Plan: buPROPion (WELLBUTRIN SR) 150 MG 12 hr tablet  Vitamin D deficiency - Plan: Vitamin D, Ergocalciferol, (DRISDOL) 50000 units CAPS capsule  Prediabetes  At risk for diabetes mellitus  PLAN:  Vitamin D Deficiency Shasmeen was informed that low vitamin D levels contributes to fatigue and are associated with obesity, breast, and colon cancer. She agrees to continue to take prescription Vit D @50 ,000 IU every week, with 1 refill  and will follow up for routine testing of vitamin D, at least 2-3 times per year. She was informed of the risk of over-replacement of vitamin D and agrees to not increase her dose unless he discusses this with [place] first.  Depression with Emotional Eating Behaviors We discussed behavior modification techniques today to help Orris deal with her emotional eating and depression. She has agreed to start to take Wellbutrin SR 150 mg qd #30 with no refills and agreed to follow up as directed.  Pre-Diabetes Mareah will continue to work on weight loss, exercise, and decreasing simple carbohydrates in her diet to help decrease the risk of diabetes. She was informed that eating too many simple carbohydrates or too many calories at one sitting increases the likelihood of GI side effects. She agrees to continue Bydureon and will re-check  labs in 2 months. Makiyla agreed to follow up with [place] as directed to monitor her progress.  Diabetes risk counselling Breindel  was given extended (at least 15 minutes) diabetes prevention counseling today. She is 36 y.o. female and has risk factors for diabetes including obesity. We discussed intensive lifestyle modifications today with an emphasis on weight loss as well as increasing exercise and decreasing simple carbohydrates in her diet.  Obesity Aldena is currently in the action stage of change. As such, her goal is to continue with weight loss efforts She has agreed to keep a food journal with 400 to 700 calories at supper daily and follow the Category 3 plan Salli has been instructed to work up to a goal of 150 minutes of combined cardio and strengthening exercise per week for weight loss and overall health benefits. We discussed the following Behavioral Modification Stratagies today: increasing lean protein intake, decreasing simple carbohydrates , increasing vegetables, work on meal planning and easy cooking plans and emotional eating strategies  Kurstyn has agreed to follow up with our clinic in 2 weeks. She was informed of the importance of frequent follow up visits to maximize her success with intensive lifestyle modifications for her multiple health conditions.  I, Doreene Nest, am acting as scribe for Dennard Nip, MD  I have reviewed the above documentation for accuracy and completeness, and I agree with the above. -Dennard Nip, MD

## 2016-07-30 ENCOUNTER — Ambulatory Visit (INDEPENDENT_AMBULATORY_CARE_PROVIDER_SITE_OTHER): Payer: 59 | Admitting: Family Medicine

## 2016-07-30 VITALS — BP 127/82 | HR 85 | Temp 98.4°F | Ht 64.0 in | Wt 321.0 lb

## 2016-07-30 DIAGNOSIS — R7303 Prediabetes: Secondary | ICD-10-CM

## 2016-07-30 DIAGNOSIS — F3289 Other specified depressive episodes: Secondary | ICD-10-CM

## 2016-07-30 NOTE — Progress Notes (Signed)
Office: [phone]  /  Fax: [phone]   HPI:   Chief Complaint: OBESITY Amanda Murphy is here to discuss her progress with her obesity treatment plan. She is following her eating plan approximately 75 % of the time and states she is walking for  exercise 45 minutes 3 times per week. Amanda Murphy had some increased temptations for Valentine's day but she was able to make better choices than previously.  Her weight is (!) 321 lb (145.6 kg) today and has had a weight loss of 1 pound over a period of 2 weeks since her last visit. She has lost 3 lbs since starting treatment with [place].  Depression with emotional eating behaviors Amanda Murphy is struggling with emotional eating and using food for comfort to the extent that it is negatively impacting her health. She often snacks when she is not hungry. Amanda Murphy sometimes feels she is out of control and then feels guilty that she made poor food choices. She has been working on behavior modification techniques to help reduce her emotional eating and has been somewhat successful. Amanda Murphy states her mood is stable and she denies insomnia. Her blood pressure is controlled and She shows no sign of suicidal or homicidal ideations on Wellbutrin.  Pre-Diabetes Amanda Murphy has a diagnosis of prediabetes based on her elevated HgA1c and was informed this puts her at greater risk of developing diabetes. She is taking Bydureon currently and feels this is helping her polyphagia. She continues to work on diet and exercise to decrease risk of diabetes. She denies nausea or hypoglycemia.   Depression screen [facility] 2/9 05/19/2016  Decreased Interest 2  Down, Depressed, Hopeless 1  PHQ - 2 Score 3  Altered sleeping 1  Tired, decreased energy 3  Change in appetite 3  Feeling bad or failure about yourself  2  Trouble concentrating 1  Moving slowly or fidgety/restless 2  Suicidal thoughts 0  PHQ-9 Score 15     Wt Readings from Last 500 Encounters:  07/30/16 (!) 321 lb (145.6  kg)  07/16/16 (!) 322 lb (146.1 kg)  07/01/16 (!) 320 lb (145.2 kg)  06/30/16 (!) 320 lb (145.2 kg)  06/16/16 (!) 320 lb (145.2 kg)  05/28/16 (!) 320 lb 12.8 oz (145.5 kg)  05/19/16 (!) 324 lb 6.4 oz (147.1 kg)  12/27/15 (!) 317 lb (143.8 kg)  11/11/15 (!) 318 lb (144.2 kg)  09/27/15 (!) 314 lb 1.9 oz (142.5 kg)  07/31/15 (!) 307 lb 12.8 oz (139.6 kg)  05/15/15 298 lb (135.2 kg)  01/07/15 272 lb (123.4 kg)  12/25/14 268 lb (121.6 kg)  11/29/14 252 lb 8 oz (114.5 kg)  10/31/14 244 lb 0.8 oz (110.7 kg)  10/19/14 244 lb 11.2 oz (111 kg)  10/19/14 244 lb (110.7 kg)  09/19/14 241 lb 6.4 oz (109.5 kg)  08/16/14 238 lb (108 kg)  05/14/14 240 lb 12.8 oz (109.2 kg)  02/07/14 236 lb (107 kg)  01/18/14 240 lb (108.9 kg)     ALLERGIES: No Known Allergies  MEDICATIONS: Current Outpatient Prescriptions on File Prior to Visit  Medication Sig Dispense Refill  . acetaminophen (TYLENOL) 500 MG tablet Take 1,000 mg by mouth every 6 (six) hours as needed for mild pain.    Amanda Murphy ALPRAZolam (XANAX) 0.5 MG tablet Take 1 tablet (0.5 mg total) by mouth at bedtime as needed for anxiety. 20 tablet 0  . aspirin EC 325 MG tablet Take 325 mg by mouth daily.    Amanda Murphy buPROPion (WELLBUTRIN SR) 150 MG 12 hr  tablet Take 1 tablet (150 mg total) by mouth 2 (two) times daily. 30 tablet 0  . calcium carbonate (TUMS - DOSED IN MG ELEMENTAL CALCIUM) 500 MG chewable tablet Chew 1 tablet by mouth as needed. Reported on 07/31/2015    . Cholecalciferol (VITAMIN D3) 50000 units CAPS One tab weekly 4 capsule 0  . Cyanocobalamin (VITAMIN B 12 PO) Take 1 tablet by mouth as needed. Reported on 07/31/2015    . Exenatide ER 2 MG PEN Inject 2 mg into the skin once a week. 4 each 11  . levocetirizine (XYZAL) 5 MG tablet Take 1 tablet (5 mg total) by mouth every evening. 30 tablet 11  . Prenatal Vit-Fe Fumarate-FA (PRENATAL MULTIVITAMIN) TABS tablet Take 1 tablet by mouth daily at 12 noon.    . Vitamin D, Ergocalciferol, (DRISDOL) 50000  units CAPS capsule Take 1 capsule (50,000 Units total) by mouth every 7 (seven) days. 4 capsule 0   No current facility-administered medications on file prior to visit.     PAST MEDICAL HISTORY: Past Medical History:  Diagnosis Date  . Anemia   . Asthma    as child   . Astigmatism   . Fibroids   . Headache(784.0)   . Hx of blood clots   . Joint pain    ankles and hips  . PCOS (polycystic ovarian syndrome)   . Pneumonia    3-4 yrs ago   . Pre-diabetes   . Vision abnormalities   . Vitamin D deficiency     PAST SURGICAL HISTORY: Past Surgical History:  Procedure Laterality Date  . CRANIOTOMY N/A 10/31/2014   Procedure: CRANIOTOMY FOR RESECTION OF TUMOR;  Surgeon: Jovita Gamma, MD;  Location: [place] [facility];  Service: Neurosurgery;  Laterality: N/A;  Craniotomy for resection of tumor  . MYOMECTOMY    . WISDOM TOOTH EXTRACTION  2006    SOCIAL HISTORY: Social History  Substance Use Topics  . Smoking status: Never Smoker  . Smokeless tobacco: Never Used  . Alcohol use 0.0 oz/week     Comment: occasional    FAMILY HISTORY: Family History  Problem Relation Age of Onset  . Hypertension Mother   . Diabetes Mother   . Obesity Mother   . Ovarian cancer    . Breast cancer    . Fibroids      ROS: Review of Systems  Constitutional: Positive for weight loss.  Gastrointestinal: Negative for nausea.  Endo/Heme/Allergies:       Polyphagia Negative hypoglycemia  Psychiatric/Behavioral: Negative for suicidal ideas.    PHYSICAL EXAM: Blood pressure 127/82, pulse 85, temperature 98.4 F (36.9 C), temperature source Oral, height 5\' 4"  (1.626 m), weight (!) 321 lb (145.6 kg), last menstrual period 07/16/2016, SpO2 95 %. Body mass index is 55.1 kg/m. Physical Exam  Constitutional: She is oriented to person, place, and time. She appears well-developed and well-nourished.  Cardiovascular: Normal rate.   Pulmonary/Chest: Effort normal.  Musculoskeletal: Normal range of  motion.  Neurological: She is oriented to person, place, and time.  Skin: Skin is warm and dry.  Vitals reviewed.   RECENT LABS AND TESTS: BMET    Component Value Date/Time   NA 141 05/19/2016 1053   K 4.2 05/19/2016 1053   CL 101 05/19/2016 1053   CO2 25 05/19/2016 1053   GLUCOSE 85 05/19/2016 1053   GLUCOSE 89 09/27/2015 1010   BUN 10 05/19/2016 1053   CREATININE 0.87 05/19/2016 1053   CALCIUM 9.2 05/19/2016 1053   GFRNONAA 86  05/19/2016 1053   GFRAA 99 05/19/2016 1053   Lab Results  Component Value Date   HGBA1C 5.8 (H) 05/19/2016   HGBA1C 6.1 09/27/2015   HGBA1C 5.8 09/19/2014   No results found for: INSULIN CBC    Component Value Date/Time   WBC 7.5 05/19/2016 1053   WBC 8.1 09/27/2015 1010   RBC 4.50 05/19/2016 1053   RBC 4.23 09/27/2015 1010   HGB 12.5 09/27/2015 1010   HCT 41.6 05/19/2016 1053   PLT 352 05/19/2016 1053   MCV 92 05/19/2016 1053   MCH 30.0 05/19/2016 1053   MCH 28.8 11/29/2014 0423   MCHC 32.5 05/19/2016 1053   MCHC 32.8 09/27/2015 1010   RDW 16.1 (H) 05/19/2016 1053   LYMPHSABS 3.1 05/19/2016 1053   MONOABS 0.4 11/23/2014 1339   EOSABS 0.2 05/19/2016 1053   BASOSABS 0.0 05/19/2016 1053   Iron/TIBC/Ferritin/ %Sat    Component Value Date/Time   IRON 63 05/19/2016 1105   TIBC 343 05/19/2016 1105   FERRITIN 34 05/19/2016 1105   IRONPCTSAT 18 05/19/2016 1105   Lipid Panel     Component Value Date/Time   CHOL 196 05/19/2016 1235   TRIG 104 05/19/2016 1235   HDL 72 05/19/2016 1235   CHOLHDL 3 09/27/2015 1010   VLDL 21.6 09/27/2015 1010   LDLCALC 103 (H) 05/19/2016 1235   Hepatic Function Panel     Component Value Date/Time   PROT 7.1 05/19/2016 1053   ALBUMIN 3.7 05/19/2016 1053   AST 23 05/19/2016 1053   ALT 33 (H) 05/19/2016 1053   ALKPHOS 84 05/19/2016 1053   BILITOT 0.3 05/19/2016 1053      Component Value Date/Time   TSH 1.420 05/19/2016 1053   TSH 0.239 (L) 11/26/2014 1325    ASSESSMENT AND PLAN: Other  depression  Prediabetes  Morbid obesity ([place])  PLAN:  Depression with Emotional Eating Behaviors We discussed behavior modification techniques today to help Afi deal with her emotional eating and depression. She has agreed to continue to take Wellbutrin SR 150 mg qd and to work on cognitive behavioral therapy to decrease emotional eating and stress levels. Marisleysis agreed to follow up as directed.  Pre-Diabetes Estrellita will continue to work on weight loss, exercise, and decreasing simple carbohydrates in her diet to help decrease the risk of diabetes. She was informed that eating too many simple carbohydrates or too many calories at one sitting increases the likelihood of GI side effects. Marvin agreed to continue to take Bydureon and will follow up with [place] as directed to monitor her progress.  We spent > than 50% of the 30 minute visit on the counseling as documented in the note.  Obesity Myauna is currently in the action stage of change. As such, her goal is to continue with weight loss efforts She has agreed to keep a food journal with 400 to 700 calories and 40 grams of  protein at supper daily and follow the Category 3 plan Mie has been instructed to work up to a goal of 150 minutes of combined cardio and strengthening exercise per week for weight loss and overall health benefits. We discussed the following Behavioral Modification Stratagies today: increasing lean protein intake, decreasing simple carbohydrates  and emotional eating strategies  Chasiti has agreed to follow up with our clinic in 2 weeks. She was informed of the importance of frequent follow up visits to maximize her success with intensive lifestyle modifications for her multiple health conditions.  Corey Skains, am acting  as scribe for Dennard Nip, MD  I have reviewed the above documentation for accuracy and completeness, and I agree with the above. -Dennard Nip, MD

## 2016-08-18 ENCOUNTER — Ambulatory Visit (INDEPENDENT_AMBULATORY_CARE_PROVIDER_SITE_OTHER): Payer: 59 | Admitting: Family Medicine

## 2016-08-18 VITALS — BP 125/79 | HR 88 | Temp 98.0°F | Ht 64.0 in | Wt 322.0 lb

## 2016-08-18 DIAGNOSIS — E559 Vitamin D deficiency, unspecified: Secondary | ICD-10-CM | POA: Diagnosis not present

## 2016-08-18 DIAGNOSIS — F3289 Other specified depressive episodes: Secondary | ICD-10-CM | POA: Diagnosis not present

## 2016-08-18 MED ORDER — BUPROPION HCL ER (SR) 150 MG PO TB12: 150.0000 mg | ORAL_TABLET | Freq: Every day | ORAL | 0 refills | Status: DC

## 2016-08-18 MED FILL — BUPROPION SR 150 MG TABLET: 150 | 30 days supply | Qty: 30 | Fill #0

## 2016-08-18 NOTE — Progress Notes (Signed)
Office: [phone]  /  Fax: [phone]   HPI:   Chief Complaint: OBESITY Amanda Murphy is here to discuss her progress with her obesity treatment plan. She is following her eating plan approximately 50 % of the time and states she is exercising 60 minutes 1 time per week. Amanda Murphy still has not lost weight since starting the program. She has been unable to follow a structured plan closely. She has mostly maintained her weight during this process though. Work and family obligations are keeping her from exercising more than 1 time per week. Her weight is (!) 322 lb (146.1 kg) today and has had a weight gain of 1 lb over a period of 2 weeks since her last visit. She has lost 3 lbs since starting treatment with [place].  Depression with emotional eating behaviors Murphy is struggling with emotional eating and using food for comfort to the extent that it is negatively impacting her health. She often snacks when she is not hungry. Amanda Murphy sometimes feels she is out of control and then feels guilty that she made poor food choices. She has been working on behavior modification techniques to help reduce her emotional eating and she notes feeling more in control with her emotional eating, no worsening insomnia and blood pressure is stable. She shows no sign of suicidal or homicidal ideations.  Vitamin D deficiency Amanda Murphy has a diagnosis of vitamin D deficiency. She is currently taking vit D and denies nausea, vomiting or muscle weakness.  Depression screen [facility] 2/9 05/19/2016  Decreased Interest 2  Down, Depressed, Hopeless 1  PHQ - 2 Score 3  Altered sleeping 1  Tired, decreased energy 3  Change in appetite 3  Feeling bad or failure about yourself  2  Trouble concentrating 1  Moving slowly or fidgety/restless 2  Suicidal thoughts 0  PHQ-9 Score 15     Wt Readings from Last 500 Encounters:  08/18/16 (!) 322 lb (146.1 kg)  07/30/16 (!) 321 lb (145.6 kg)  07/16/16 (!) 322 lb (146.1 kg)    07/01/16 (!) 320 lb (145.2 kg)  06/30/16 (!) 320 lb (145.2 kg)  06/16/16 (!) 320 lb (145.2 kg)  05/28/16 (!) 320 lb 12.8 oz (145.5 kg)  05/19/16 (!) 324 lb 6.4 oz (147.1 kg)  12/27/15 (!) 317 lb (143.8 kg)  11/11/15 (!) 318 lb (144.2 kg)  09/27/15 (!) 314 lb 1.9 oz (142.5 kg)  07/31/15 (!) 307 lb 12.8 oz (139.6 kg)  05/15/15 298 lb (135.2 kg)  01/07/15 272 lb (123.4 kg)  12/25/14 268 lb (121.6 kg)  11/29/14 252 lb 8 oz (114.5 kg)  10/31/14 244 lb 0.8 oz (110.7 kg)  10/19/14 244 lb 11.2 oz (111 kg)  10/19/14 244 lb (110.7 kg)  09/19/14 241 lb 6.4 oz (109.5 kg)  08/16/14 238 lb (108 kg)  05/14/14 240 lb 12.8 oz (109.2 kg)  02/07/14 236 lb (107 kg)  01/18/14 240 lb (108.9 kg)     ALLERGIES: No Known Allergies  MEDICATIONS: Current Outpatient Prescriptions on File Prior to Visit  Medication Sig Dispense Refill  . acetaminophen (TYLENOL) 500 MG tablet Take 1,000 mg by mouth every 6 (six) hours as needed for mild pain.    [name] ALPRAZolam (XANAX) 0.5 MG tablet Take 1 tablet (0.5 mg total) by mouth at bedtime as needed for anxiety. 20 tablet 0  . aspirin EC 325 MG tablet Take 325 mg by mouth daily.    . calcium carbonate (TUMS - DOSED IN MG ELEMENTAL CALCIUM) 500 MG  chewable tablet Chew 1 tablet by mouth as needed. Reported on 07/31/2015    . Cholecalciferol (VITAMIN D3) 50000 units CAPS One tab weekly 4 capsule 0  . Cyanocobalamin (VITAMIN B 12 PO) Take 1 tablet by mouth as needed. Reported on 07/31/2015    . Exenatide ER 2 MG PEN Inject 2 mg into the skin once a week. 4 each 11  . levocetirizine (XYZAL) 5 MG tablet Take 1 tablet (5 mg total) by mouth every evening. 30 tablet 11  . Prenatal Vit-Fe Fumarate-FA (PRENATAL MULTIVITAMIN) TABS tablet Take 1 tablet by mouth daily at 12 noon.    . Vitamin D, Ergocalciferol, (DRISDOL) 50000 units CAPS capsule Take 1 capsule (50,000 Units total) by mouth every 7 (seven) days. 4 capsule 0   No current facility-administered medications on file  prior to visit.     PAST MEDICAL HISTORY: Past Medical History:  Diagnosis Date  . Anemia   . Asthma    as child   . Astigmatism   . Fibroids   . Headache(784.0)   . Hx of blood clots   . Joint pain    ankles and hips  . PCOS (polycystic ovarian syndrome)   . Pneumonia    3-4 yrs ago   . Pre-diabetes   . Vision abnormalities   . Vitamin D deficiency     PAST SURGICAL HISTORY: Past Surgical History:  Procedure Laterality Date  . CRANIOTOMY N/A 10/31/2014   Procedure: CRANIOTOMY FOR RESECTION OF TUMOR;  Surgeon: Jovita Gamma, MD;  Location: [facility];  Service: Neurosurgery;  Laterality: N/A;  Craniotomy for resection of tumor  . MYOMECTOMY    . WISDOM TOOTH EXTRACTION  2006    SOCIAL HISTORY: Social History  Substance Use Topics  . Smoking status: Never Smoker  . Smokeless tobacco: Never Used  . Alcohol use 0.0 oz/week     Comment: occasional    FAMILY HISTORY: Family History  Problem Relation Age of Onset  . Hypertension Mother   . Diabetes Mother   . Obesity Mother   . Ovarian cancer    . Breast cancer    . Fibroids      ROS: Review of Systems  Constitutional: Negative for weight loss.  Gastrointestinal: Negative for nausea and vomiting.  Musculoskeletal:       Negative muscle weakness  Psychiatric/Behavioral: Positive for depression. Negative for suicidal ideas. The patient has insomnia.     PHYSICAL EXAM: Blood pressure 125/79, pulse 88, temperature 98 F (36.7 C), temperature source Oral, height 5\' 4"  (1.626 m), weight (!) 322 lb (146.1 kg), last menstrual period 08/13/2016, SpO2 99 %. Body mass index is 55.27 kg/m. Physical Exam  Constitutional: She is oriented to person, place, and time. She appears well-developed and well-nourished.  Cardiovascular: Normal rate.   Pulmonary/Chest: Effort normal.  Musculoskeletal: Normal range of motion.  Neurological: She is oriented to person, place, and time.  Skin: Skin is warm and dry.  Vitals  reviewed.   RECENT LABS AND TESTS: BMET    Component Value Date/Time   NA 141 05/19/2016 1053   K 4.2 05/19/2016 1053   CL 101 05/19/2016 1053   CO2 25 05/19/2016 1053   GLUCOSE 85 05/19/2016 1053   GLUCOSE 89 09/27/2015 1010   BUN 10 05/19/2016 1053   CREATININE 0.87 05/19/2016 1053   CALCIUM 9.2 05/19/2016 1053   GFRNONAA 86 05/19/2016 1053   GFRAA 99 05/19/2016 1053   Lab Results  Component Value Date   HGBA1C  5.8 (H) 05/19/2016   HGBA1C 6.1 09/27/2015   HGBA1C 5.8 09/19/2014   No results found for: INSULIN CBC    Component Value Date/Time   WBC 7.5 05/19/2016 1053   WBC 8.1 09/27/2015 1010   RBC 4.50 05/19/2016 1053   RBC 4.23 09/27/2015 1010   HGB 12.5 09/27/2015 1010   HCT 41.6 05/19/2016 1053   PLT 352 05/19/2016 1053   MCV 92 05/19/2016 1053   MCH 30.0 05/19/2016 1053   MCH 28.8 11/29/2014 0423   MCHC 32.5 05/19/2016 1053   MCHC 32.8 09/27/2015 1010   RDW 16.1 (H) 05/19/2016 1053   LYMPHSABS 3.1 05/19/2016 1053   MONOABS 0.4 11/23/2014 1339   EOSABS 0.2 05/19/2016 1053   BASOSABS 0.0 05/19/2016 1053   Iron/TIBC/Ferritin/ %Sat    Component Value Date/Time   IRON 63 05/19/2016 1105   TIBC 343 05/19/2016 1105   FERRITIN 34 05/19/2016 1105   IRONPCTSAT 18 05/19/2016 1105   Lipid Panel     Component Value Date/Time   CHOL 196 05/19/2016 1235   TRIG 104 05/19/2016 1235   HDL 72 05/19/2016 1235   CHOLHDL 3 09/27/2015 1010   VLDL 21.6 09/27/2015 1010   LDLCALC 103 (H) 05/19/2016 1235   Hepatic Function Panel     Component Value Date/Time   PROT 7.1 05/19/2016 1053   ALBUMIN 3.7 05/19/2016 1053   AST 23 05/19/2016 1053   ALT 33 (H) 05/19/2016 1053   ALKPHOS 84 05/19/2016 1053   BILITOT 0.3 05/19/2016 1053      Component Value Date/Time   TSH 1.420 05/19/2016 1053   TSH 0.239 (L) 11/26/2014 1325    ASSESSMENT AND PLAN: Other depression - Plan: buPROPion (WELLBUTRIN SR) 150 MG 12 hr tablet  Vitamin D deficiency  Morbid obesity  ([place])  PLAN:  Depression with Emotional Eating Behaviors We discussed behavior modification techniques today to help Amanda Murphy deal with her emotional eating and depression. She has agreed to take Wellbutrin SR 150 mg every morning #30 with no refills and agreed to follow up as directed.  Vitamin D Deficiency Amanda Murphy was informed that low vitamin D levels contributes to fatigue and are associated with obesity, breast, and colon cancer. She agrees to continue to take prescription Vit D @50 ,000 IU every week and we will re-check labs at next visit and will follow up for routine testing of vitamin D, at least 2-3 times per year. She was informed of the risk of over-replacement of vitamin D and agrees to not increase her dose unless he discusses this with [place] first.  Obesity Amanda Murphy is not currently in the action stage of change. We will continue to support her as she maintains her weight and help her when she is ready to make changes. As such, her goal is to maintain weight for now She has agreed to keep a food journal with 1200 to 1700 calories and 90+ grams protein daily Amanda Murphy has been instructed to work up to a goal of 150 minutes of combined cardio and strengthening exercise per week for weight loss and overall health benefits. We discussed the following Behavioral Modification Stratagies today: increasing lean protein intake, decreasing simple carbohydrates , increasing lower sugar fruits and travel eating strategies   Amanda Murphy has agreed to follow up with our clinic in 2 weeks. She was informed of the importance of frequent follow up visits to maximize her success with intensive lifestyle modifications for her multiple health conditions.  Corey Skains, am acting as scribe  for Dennard Nip, MD  I have reviewed the above documentation for accuracy and completeness, and I agree with the above. -Dennard Nip, MD

## 2016-08-19 ENCOUNTER — Encounter (INDEPENDENT_AMBULATORY_CARE_PROVIDER_SITE_OTHER): Payer: Self-pay | Admitting: Family Medicine

## 2016-08-21 MED FILL — BYDUREON 2 MG PEN INJECT: 2 | 28 days supply | Qty: 4 | Fill #7

## 2016-09-01 ENCOUNTER — Ambulatory Visit (INDEPENDENT_AMBULATORY_CARE_PROVIDER_SITE_OTHER): Payer: 59 | Admitting: Family Medicine

## 2016-09-01 VITALS — BP 114/79 | HR 95 | Temp 98.4°F | Ht 64.0 in | Wt 315.0 lb

## 2016-09-01 DIAGNOSIS — R945 Abnormal results of liver function studies: Secondary | ICD-10-CM

## 2016-09-01 DIAGNOSIS — F3289 Other specified depressive episodes: Secondary | ICD-10-CM

## 2016-09-01 DIAGNOSIS — R7989 Other specified abnormal findings of blood chemistry: Secondary | ICD-10-CM

## 2016-09-01 DIAGNOSIS — E559 Vitamin D deficiency, unspecified: Secondary | ICD-10-CM

## 2016-09-01 DIAGNOSIS — R7303 Prediabetes: Secondary | ICD-10-CM

## 2016-09-01 DIAGNOSIS — E7849 Other hyperlipidemia: Secondary | ICD-10-CM

## 2016-09-01 DIAGNOSIS — E784 Other hyperlipidemia: Secondary | ICD-10-CM | POA: Diagnosis not present

## 2016-09-01 MED ORDER — BUPROPION HCL ER (SR) 150 MG PO TB12: 150.0000 mg | ORAL_TABLET | Freq: Every day | ORAL | 0 refills | Status: DC

## 2016-09-01 MED ORDER — VITAMIN D (ERGOCALCIFEROL) 1.25 MG (50000 UNIT) PO CAPS: 50000.0000 [IU] | ORAL_CAPSULE | ORAL | 0 refills | Status: DC

## 2016-09-01 MED FILL — VIT D2 1.25 MG (50,000 UNIT: 1.25 MG | 28 days supply | Qty: 4 | Fill #0

## 2016-09-01 NOTE — Progress Notes (Signed)
Office: [phone]  /  Fax: [phone]   HPI:   Chief Complaint: OBESITY Amanda Murphy is here to discuss her progress with her obesity treatment plan. She is following her eating plan approximately 90 % of the time and states she is exercising 30 minutes 2 to 3 times per week. Amanda Murphy continues to do well with weight loss, is journaling but sometimes skipping meals. She has started exercising and counting steps. She is doing well overall and doesn't want to make any changes. Her weight is (!) 315 lb (142.9 kg) today and has had a weight loss of 7 pounds over a period of 2 weeks since her last visit. She has lost 9 lbs since starting treatment with [place].  Vitamin D deficiency Amanda Murphy has a diagnosis of vitamin D deficiency. She is currently stable on vit D and denies nausea, vomiting or muscle weakness. Amanda Murphy is due for labs.  Depression with emotional eating behaviors Amanda Murphy is stable on Wellbutrin, emotional eating has decreased, blood pressure is stable. She struggles with emotional eating and using food for comfort to the extent that it is negatively impacting her health. She often snacks when she is not hungry. Amanda Murphy sometimes feels she is out of control and then feels guilty that she made poor food choices. She has been working on behavior modification techniques to help reduce her emotional eating and has been somewhat successful. She denies insomnia and shows no sign of suicidal or homicidal ideations.  Pre-Diabetes Amanda Murphy has a diagnosis of pre-diabetes based on her elevated Hgb A1c and was informed this puts her at greater risk of developing diabetes. Her PCP put her on Bydureon for obesity but it is also helping with her pre-diabetes. She continues to work on diet and exercise to decrease risk of diabetes. She denies nausea, vomiting or hypoglycemia. Polyphagia is controlled.  Hyperlipidemia Amanda Murphy has hyperlipidemia and has been trying to improve her cholesterol levels  with intensive lifestyle modification including a low saturated fat diet, exercise and weight loss. She denies any chest pain, claudication or myalgias.  Elevated LFT Amanda Murphy has a new dx of slightly elevated ALT last labs. Her BMI is over 40. She is working weight loss as this is likely due to Non Alcoholic Fatty Liver Disease. She denies abdominal pain or jaundice and has never been told of any liver problems in the past. She denies excessive alcohol intake.     Depression screen Amanda Murphy 2/9 05/19/2016  Decreased Interest 2  Down, Depressed, Hopeless 1  PHQ - 2 Score 3  Altered sleeping 1  Tired, decreased energy 3  Change in appetite 3  Feeling bad or failure about yourself  2  Trouble concentrating 1  Moving slowly or fidgety/restless 2  Suicidal thoughts 0  PHQ-9 Score 15     Wt Readings from Last 500 Encounters:  09/01/16 (!) 315 lb (142.9 kg)  08/18/16 (!) 322 lb (146.1 kg)  07/30/16 (!) 321 lb (145.6 kg)  07/16/16 (!) 322 lb (146.1 kg)  07/01/16 (!) 320 lb (145.2 kg)  06/30/16 (!) 320 lb (145.2 kg)  06/16/16 (!) 320 lb (145.2 kg)  05/28/16 (!) 320 lb 12.8 oz (145.5 kg)  05/19/16 (!) 324 lb 6.4 oz (147.1 kg)  12/27/15 (!) 317 lb (143.8 kg)  11/11/15 (!) 318 lb (144.2 kg)  09/27/15 (!) 314 lb 1.9 oz (142.5 kg)  07/31/15 (!) 307 lb 12.8 oz (139.6 kg)  05/15/15 298 lb (135.2 kg)  01/07/15 272 lb (123.4 kg)  12/25/14 268 lb (  121.6 kg)  11/29/14 252 lb 8 oz (114.5 kg)  10/31/14 244 lb 0.8 oz (110.7 kg)  10/19/14 244 lb 11.2 oz (111 kg)  10/19/14 244 lb (110.7 kg)  09/19/14 241 lb 6.4 oz (109.5 kg)  08/16/14 238 lb (108 kg)  05/14/14 240 lb 12.8 oz (109.2 kg)  02/07/14 236 lb (107 kg)  01/18/14 240 lb (108.9 kg)     ALLERGIES: No Known Allergies  MEDICATIONS: Current Outpatient Prescriptions on File Prior to Visit  Medication Sig Dispense Refill  . acetaminophen (TYLENOL) 500 MG tablet Take 1,000 mg by mouth every 6 (six) hours as needed for mild pain.    [name]  ALPRAZolam (XANAX) 0.5 MG tablet Take 1 tablet (0.5 mg total) by mouth at bedtime as needed for anxiety. 20 tablet 0  . aspirin EC 325 MG tablet Take 325 mg by mouth daily.    [name] buPROPion (WELLBUTRIN SR) 150 MG 12 hr tablet Take 1 tablet (150 mg total) by mouth daily. 30 tablet 0  . calcium carbonate (TUMS - DOSED IN MG ELEMENTAL CALCIUM) 500 MG chewable tablet Chew 1 tablet by mouth as needed. Reported on 07/31/2015    . Cholecalciferol (VITAMIN D3) 50000 units CAPS One tab weekly 4 capsule 0  . Cyanocobalamin (VITAMIN B 12 PO) Take 1 tablet by mouth as needed. Reported on 07/31/2015    . Exenatide ER 2 MG PEN Inject 2 mg into the skin once a week. 4 each 11  . levocetirizine (XYZAL) 5 MG tablet Take 1 tablet (5 mg total) by mouth every evening. 30 tablet 11  . Prenatal Vit-Fe Fumarate-FA (PRENATAL MULTIVITAMIN) TABS tablet Take 1 tablet by mouth daily at 12 noon.    . Vitamin D, Ergocalciferol, (DRISDOL) 50000 units CAPS capsule Take 1 capsule (50,000 Units total) by mouth every 7 (seven) days. 4 capsule 0   No current facility-administered medications on file prior to visit.     PAST MEDICAL HISTORY: Past Medical History:  Diagnosis Date  . Anemia   . Asthma    as child   . Astigmatism   . Fibroids   . Headache(784.0)   . Hx of blood clots   . Joint pain    ankles and hips  . PCOS (polycystic ovarian syndrome)   . Pneumonia    3-4 yrs ago   . Pre-diabetes   . Vision abnormalities   . Vitamin D deficiency     PAST SURGICAL HISTORY: Past Surgical History:  Procedure Laterality Date  . CRANIOTOMY N/A 10/31/2014   Procedure: CRANIOTOMY FOR RESECTION OF TUMOR;  Surgeon: Jovita Gamma, MD;  Location: [facility];  Service: Neurosurgery;  Laterality: N/A;  Craniotomy for resection of tumor  . MYOMECTOMY    . WISDOM TOOTH EXTRACTION  2006    SOCIAL HISTORY: Social History  Substance Use Topics  . Smoking status: Never Smoker  . Smokeless tobacco: Never Used  . Alcohol use  0.0 oz/week     Comment: occasional    FAMILY HISTORY: Family History  Problem Relation Age of Onset  . Hypertension Mother   . Diabetes Mother   . Obesity Mother   . Ovarian cancer    . Breast cancer    . Fibroids      ROS: Review of Systems  Constitutional: Positive for weight loss.       Negative jaundice  Cardiovascular: Negative for chest pain and claudication.  Gastrointestinal: Negative for abdominal pain, nausea and vomiting.  Musculoskeletal: Negative for myalgias.  Negative muscle weakness  Endo/Heme/Allergies:       Negative hyppoglycemia polyphagia  Psychiatric/Behavioral: Negative for suicidal ideas. The patient does not have insomnia.     PHYSICAL EXAM: Blood pressure 114/79, pulse 95, temperature 98.4 F (36.9 C), temperature source Oral, height 5\' 4"  (1.626 m), weight (!) 315 lb (142.9 kg), last menstrual period 08/13/2016, SpO2 99 %. Body mass index is 54.07 kg/m. Physical Exam  Constitutional: She is oriented to person, place, and time. She appears well-developed and well-nourished.  Cardiovascular: Normal rate.   Pulmonary/Chest: Effort normal.  Musculoskeletal: Normal range of motion.  Neurological: She is oriented to person, place, and time.  Skin: Skin is warm and dry.  Psychiatric: She has a normal mood and affect. Her behavior is normal.  Vitals reviewed.   RECENT LABS AND TESTS: BMET    Component Value Date/Time   NA 141 05/19/2016 1053   K 4.2 05/19/2016 1053   CL 101 05/19/2016 1053   CO2 25 05/19/2016 1053   GLUCOSE 85 05/19/2016 1053   GLUCOSE 89 09/27/2015 1010   BUN 10 05/19/2016 1053   CREATININE 0.87 05/19/2016 1053   CALCIUM 9.2 05/19/2016 1053   GFRNONAA 86 05/19/2016 1053   GFRAA 99 05/19/2016 1053   Lab Results  Component Value Date   HGBA1C 5.8 (H) 05/19/2016   HGBA1C 6.1 09/27/2015   HGBA1C 5.8 09/19/2014   No results found for: INSULIN CBC    Component Value Date/Time   WBC 7.5 05/19/2016 1053   WBC  8.1 09/27/2015 1010   RBC 4.50 05/19/2016 1053   RBC 4.23 09/27/2015 1010   HGB 12.5 09/27/2015 1010   HCT 41.6 05/19/2016 1053   PLT 352 05/19/2016 1053   MCV 92 05/19/2016 1053   MCH 30.0 05/19/2016 1053   MCH 28.8 11/29/2014 0423   MCHC 32.5 05/19/2016 1053   MCHC 32.8 09/27/2015 1010   RDW 16.1 (H) 05/19/2016 1053   LYMPHSABS 3.1 05/19/2016 1053   MONOABS 0.4 11/23/2014 1339   EOSABS 0.2 05/19/2016 1053   BASOSABS 0.0 05/19/2016 1053   Iron/TIBC/Ferritin/ %Sat    Component Value Date/Time   IRON 63 05/19/2016 1105   TIBC 343 05/19/2016 1105   FERRITIN 34 05/19/2016 1105   IRONPCTSAT 18 05/19/2016 1105   Lipid Panel     Component Value Date/Time   CHOL 196 05/19/2016 1235   TRIG 104 05/19/2016 1235   HDL 72 05/19/2016 1235   CHOLHDL 3 09/27/2015 1010   VLDL 21.6 09/27/2015 1010   LDLCALC 103 (H) 05/19/2016 1235   Hepatic Function Panel     Component Value Date/Time   PROT 7.1 05/19/2016 1053   ALBUMIN 3.7 05/19/2016 1053   AST 23 05/19/2016 1053   ALT 33 (H) 05/19/2016 1053   ALKPHOS 84 05/19/2016 1053   BILITOT 0.3 05/19/2016 1053      Component Value Date/Time   TSH 1.420 05/19/2016 1053   TSH 0.239 (L) 11/26/2014 1325    ASSESSMENT AND PLAN: Vitamin D deficiency - Plan: VITAMIN D 25 Hydroxy (Vit-D Deficiency, Fractures), Vitamin D, Ergocalciferol, (DRISDOL) 50000 units CAPS capsule  Prediabetes - Plan: Comprehensive metabolic panel, Hemoglobin A1c, Insulin, random  Other hyperlipidemia - Plan: Lipid Panel With LDL/HDL Ratio  Elevated liver function tests - Plan: Comprehensive metabolic panel, Vitamin G01  Other depression - Plan: buPROPion (WELLBUTRIN SR) 150 MG 12 hr tablet  Morbid obesity ([place])  PLAN:  Vitamin D Deficiency Amanda Murphy was informed that low vitamin D levels contributes to fatigue  and are associated with obesity, breast, and colon cancer. She agrees to continue to take prescription Vit D @50 ,000 IU every week and will follow up  for routine testing of vitamin D, at least 2-3 times per year. She was informed of the risk of over-replacement of vitamin D and agrees to not increase her dose unless he discusses this with [place] first.  Depression with Emotional Eating Behaviors We discussed behavior modification techniques today to help Amanda Murphy deal with her emotional eating and depression. She has agreed to continue to take Wellbutrin SR 150 mg qd we will refill for 1 month and agreed to follow up as directed.  Pre-Diabetes Amanda Murphy will continue to work on weight loss, exercise, and decreasing simple carbohydrates in her diet to help decrease the risk of diabetes. She was informed that eating too many simple carbohydrates or too many calories at one sitting increases the likelihood of GI side effects. Amanda Murphy agreed to continue Bydureon. We will check labs and Amanda Murphy agreed to follow up with [place] as directed to monitor her progress.  Hyperlipidemia Amanda Murphy was informed of the American Heart Association Guidelines emphasizing intensive lifestyle modifications as the first line treatment for hyperlipidemia. We discussed many lifestyle modifications today in depth, and Amanda Murphy will continue to work on decreasing saturated fats such as fatty red meat, butter and many fried foods. She will also increase vegetables and lean protein in her diet and continue to work on exercise and weight loss efforts. We will check labs and follow.  Elevated Liver Function Test We discussed the likely diagnosis of non alcoholic fatty liver disease today and how this condition is obesity related. Amanda Murphy was educated on her risk of developing NASH or even liver failure and the only proven treatment for NAFLD was weight loss. Amanda Murphy agreed to continue with her weight loss efforts with healthier diet and exercise as an essential part of her treatment plan. We will check labs and follow.  Obesity Amanda Murphy is currently in the action stage of change. As  such, her goal is to continue with weight loss efforts She has agreed to keep a food journal with 1200 to 1700 calories and 90+ grams of protein daily Amanda Murphy has been instructed to work up to a goal of 150 minutes of combined cardio and strengthening exercise per week or continue walking, gym for 30 minutes 2 to 3 times per week for weight loss and overall health benefits. We discussed the following Behavioral Modification Stratagies today: increasing water, increasing lean protein intake, decreasing simple carbohydrates  and increasing lower sugar fruits  Amanda Murphy has agreed to follow up with our clinic in 2 weeks. She was informed of the importance of frequent follow up visits to maximize her success with intensive lifestyle modifications for her multiple health conditions.  I, Doreene Nest, am acting as scribe for Dennard Nip, MD  I have reviewed the above documentation for accuracy and completeness, and I agree with the above. -Dennard Nip, MD

## 2016-09-02 LAB — COMPREHENSIVE METABOLIC PANEL
ALK PHOS: 81 IU/L (ref 39–117)
ALT: 29 IU/L (ref 0–32)
AST: 23 IU/L (ref 0–40)
Albumin/Globulin Ratio: 1.2 (ref 1.2–2.2)
Albumin: 3.9 g/dL (ref 3.5–5.5)
BUN/Creatinine Ratio: 9 (ref 9–23)
BUN: 9 mg/dL (ref 6–20)
Bilirubin Total: 0.2 mg/dL (ref 0.0–1.2)
CO2: 26 mmol/L (ref 18–29)
CREATININE: 0.96 mg/dL (ref 0.57–1.00)
Calcium: 9.1 mg/dL (ref 8.7–10.2)
Chloride: 103 mmol/L (ref 96–106)
GFR calc Af Amer: 87 mL/min/{1.73_m2} (ref >59)
GFR calc non Af Amer: 76 mL/min/{1.73_m2} (ref >59)
GLUCOSE: 81 mg/dL (ref 65–99)
Globulin, Total: 3.2 g/dL (ref 1.5–4.5)
Potassium: 4.5 mmol/L (ref 3.5–5.2)
Sodium: 144 mmol/L (ref 134–144)
Total Protein: 7.1 g/dL (ref 6.0–8.5)

## 2016-09-02 LAB — LIPID PANEL WITH LDL/HDL RATIO
Cholesterol, Total: 172 mg/dL (ref 100–199)
HDL: 67 mg/dL
LDL Calculated: 88 mg/dL (ref 0–99)
LDl/HDL Ratio: 1.3 ratio (ref 0.0–3.2)
Triglycerides: 85 mg/dL (ref 0–149)
VLDL Cholesterol Cal: 17 mg/dL (ref 5–40)

## 2016-09-02 LAB — INSULIN, RANDOM: INSULIN: 80.9 u[IU]/mL — AB (ref 2.6–24.9)

## 2016-09-02 LAB — HEMOGLOBIN A1C
ESTIMATED AVERAGE GLUCOSE: 114 mg/dL
HEMOGLOBIN A1C: 5.6 % (ref 4.8–5.6)

## 2016-09-02 LAB — VITAMIN B12: Vitamin B-12: 1198 pg/mL (ref 232–1245)

## 2016-09-02 LAB — VITAMIN D 25 HYDROXY (VIT D DEFICIENCY, FRACTURES): Vit D, 25-Hydroxy: 43.7 ng/mL (ref 30.0–100.0)

## 2016-09-09 DIAGNOSIS — N921 Excessive and frequent menstruation with irregular cycle: Secondary | ICD-10-CM | POA: Diagnosis not present

## 2016-09-09 DIAGNOSIS — N924 Excessive bleeding in the premenopausal period: Secondary | ICD-10-CM | POA: Diagnosis not present

## 2016-09-15 ENCOUNTER — Ambulatory Visit (INDEPENDENT_AMBULATORY_CARE_PROVIDER_SITE_OTHER): Payer: 59 | Admitting: Family Medicine

## 2016-09-15 VITALS — BP 137/79 | HR 109 | Temp 98.8°F | Ht 64.0 in | Wt 322.0 lb

## 2016-09-15 DIAGNOSIS — F3289 Other specified depressive episodes: Secondary | ICD-10-CM | POA: Diagnosis not present

## 2016-09-15 DIAGNOSIS — Z9189 Other specified personal risk factors, not elsewhere classified: Secondary | ICD-10-CM | POA: Diagnosis not present

## 2016-09-15 MED ORDER — BUPROPION HCL ER (SR) 200 MG PO TB12: 200.0000 mg | ORAL_TABLET | Freq: Every day | ORAL | 0 refills | Status: DC

## 2016-09-15 NOTE — Progress Notes (Signed)
Office: [phone]  /  Fax: [phone]   HPI:   Chief Complaint: OBESITY Amanda Murphy is here to discuss her progress with her obesity treatment plan. She is following her eating plan approximately 50 % of the time and states she is walking and water aerobics for exercise 7 times per week. Amanda Murphy struggled more the last 2 weeks, she noted decreased planning ahead and increased eating out, increased stress eating with recent news of upcoming D/C for uterine polyps. Her weight is (!) 322 lb (146.1 kg) today and has had a weight gain of 7 lbs over a period of 2 weeks since her last visit. She has lost 2 lbs since starting treatment with [place].  Depression with emotional eating behaviors Amanda Murphy is struggling with emotional eating and using food for comfort to the extent that it is negatively impacting her health. She often snacks when she is not hungry. Amanda Murphy sometimes feels she is out of control and then feels guilty that she made poor food choices. Amanda Murphy is on Wellbutrin SR 150 mg and feels it has helped her mood but is still struggling with emotional eating. She has been working on behavior modification techniques to help reduce her emotional eating and has been somewhat successful. She shows no sign of suicidal or homicidal ideations.  At risk for diabetes Amanda Murphy is at higher than average risk for developing diabetes due to her obesity. She currently denies polyuria or polydipsia.  Depression screen Amanda Murphy 2/9 05/19/2016  Decreased Interest 2  Down, Depressed, Hopeless 1  PHQ - 2 Score 3  Altered sleeping 1  Tired, decreased energy 3  Change in appetite 3  Feeling bad or failure about yourself  2  Trouble concentrating 1  Moving slowly or fidgety/restless 2  Suicidal thoughts 0  PHQ-9 Score 15     Wt Readings from Last 500 Encounters:  09/15/16 (!) 322 lb (146.1 kg)  09/01/16 (!) 315 lb (142.9 kg)  08/18/16 (!) 322 lb (146.1 kg)  07/30/16 (!) 321 lb (145.6 kg)  07/16/16  (!) 322 lb (146.1 kg)  07/01/16 (!) 320 lb (145.2 kg)  06/30/16 (!) 320 lb (145.2 kg)  06/16/16 (!) 320 lb (145.2 kg)  05/28/16 (!) 320 lb 12.8 oz (145.5 kg)  05/19/16 (!) 324 lb 6.4 oz (147.1 kg)  12/27/15 (!) 317 lb (143.8 kg)  11/11/15 (!) 318 lb (144.2 kg)  09/27/15 (!) 314 lb 1.9 oz (142.5 kg)  07/31/15 (!) 307 lb 12.8 oz (139.6 kg)  05/15/15 298 lb (135.2 kg)  01/07/15 272 lb (123.4 kg)  12/25/14 268 lb (121.6 kg)  11/29/14 252 lb 8 oz (114.5 kg)  10/31/14 244 lb 0.8 oz (110.7 kg)  10/19/14 244 lb 11.2 oz (111 kg)  10/19/14 244 lb (110.7 kg)  09/19/14 241 lb 6.4 oz (109.5 kg)  08/16/14 238 lb (108 kg)  05/14/14 240 lb 12.8 oz (109.2 kg)  02/07/14 236 lb (107 kg)  01/18/14 240 lb (108.9 kg)     ALLERGIES: No Known Allergies  MEDICATIONS: Current Outpatient Prescriptions on File Prior to Visit  Medication Sig Dispense Refill  . acetaminophen (TYLENOL) 500 MG tablet Take 1,000 mg by mouth every 6 (six) hours as needed for mild pain.    [name] ALPRAZolam (XANAX) 0.5 MG tablet Take 1 tablet (0.5 mg total) by mouth at bedtime as needed for anxiety. 20 tablet 0  . aspirin EC 325 MG tablet Take 325 mg by mouth daily.    . calcium carbonate (TUMS - DOSED  IN MG ELEMENTAL CALCIUM) 500 MG chewable tablet Chew 1 tablet by mouth as needed. Reported on 07/31/2015    . Cholecalciferol (VITAMIN D3) 50000 units CAPS One tab weekly 4 capsule 0  . Cyanocobalamin (VITAMIN B 12 PO) Take 1 tablet by mouth as needed. Reported on 07/31/2015    . Exenatide ER 2 MG PEN Inject 2 mg into the skin once a week. 4 each 11  . levocetirizine (XYZAL) 5 MG tablet Take 1 tablet (5 mg total) by mouth every evening. 30 tablet 11  . Prenatal Vit-Fe Fumarate-FA (PRENATAL MULTIVITAMIN) TABS tablet Take 1 tablet by mouth daily at 12 noon.    . Vitamin D, Ergocalciferol, (DRISDOL) 50000 units CAPS capsule Take 1 capsule (50,000 Units total) by mouth every 7 (seven) days. 4 capsule 0   No current facility-administered  medications on file prior to visit.     PAST MEDICAL HISTORY: Past Medical History:  Diagnosis Date  . Anemia   . Asthma    as child   . Astigmatism   . Fibroids   . Headache(784.0)   . Hx of blood clots   . Joint pain    ankles and hips  . PCOS (polycystic ovarian syndrome)   . Pneumonia    3-4 yrs ago   . Pre-diabetes   . Vision abnormalities   . Vitamin D deficiency     PAST SURGICAL HISTORY: Past Surgical History:  Procedure Laterality Date  . CRANIOTOMY N/A 10/31/2014   Procedure: CRANIOTOMY FOR RESECTION OF TUMOR;  Surgeon: Jovita Gamma, MD;  Location: [place] [facility];  Service: Neurosurgery;  Laterality: N/A;  Craniotomy for resection of tumor  . MYOMECTOMY    . WISDOM TOOTH EXTRACTION  2006    SOCIAL HISTORY: Social History  Substance Use Topics  . Smoking status: Never Smoker  . Smokeless tobacco: Never Used  . Alcohol use 0.0 oz/week     Comment: occasional    FAMILY HISTORY: Family History  Problem Relation Age of Onset  . Hypertension Mother   . Diabetes Mother   . Obesity Mother   . Ovarian cancer    . Breast cancer    . Fibroids      ROS: Review of Systems  Constitutional: Negative for weight loss.  Genitourinary: Negative for frequency.  Endo/Heme/Allergies: Negative for polydipsia.       Polyphagia  Psychiatric/Behavioral: Positive for depression. Negative for suicidal ideas.    PHYSICAL EXAM: Blood pressure 137/79, pulse (!) 109, temperature 98.8 F (37.1 C), temperature source Oral, height 5\' 4"  (1.626 m), weight (!) 322 lb (146.1 kg), SpO2 99 %. Body mass index is 55.27 kg/m. Physical Exam  Constitutional: She is oriented to person, place, and time. She appears well-developed and well-nourished.  Cardiovascular: Normal rate.   Pulmonary/Chest: Effort normal.  Musculoskeletal: Normal range of motion.  Neurological: She is oriented to person, place, and time.  Skin: Skin is warm and dry.  Vitals reviewed.   RECENT LABS AND  TESTS: BMET    Component Value Date/Time   NA 144 09/01/2016 1231   K 4.5 09/01/2016 1231   CL 103 09/01/2016 1231   CO2 26 09/01/2016 1231   GLUCOSE 81 09/01/2016 1231   GLUCOSE 89 09/27/2015 1010   BUN 9 09/01/2016 1231   CREATININE 0.96 09/01/2016 1231   CALCIUM 9.1 09/01/2016 1231   GFRNONAA 76 09/01/2016 1231   GFRAA 87 09/01/2016 1231   Lab Results  Component Value Date   HGBA1C 5.6 09/01/2016  HGBA1C 5.8 (H) 05/19/2016   HGBA1C 6.1 09/27/2015   HGBA1C 5.8 09/19/2014   Lab Results  Component Value Date   INSULIN 80.9 (H) 09/01/2016   CBC    Component Value Date/Time   WBC 7.5 05/19/2016 1053   WBC 8.1 09/27/2015 1010   RBC 4.50 05/19/2016 1053   RBC 4.23 09/27/2015 1010   HGB 12.5 09/27/2015 1010   HCT 41.6 05/19/2016 1053   PLT 352 05/19/2016 1053   MCV 92 05/19/2016 1053   MCH 30.0 05/19/2016 1053   MCH 28.8 11/29/2014 0423   MCHC 32.5 05/19/2016 1053   MCHC 32.8 09/27/2015 1010   RDW 16.1 (H) 05/19/2016 1053   LYMPHSABS 3.1 05/19/2016 1053   MONOABS 0.4 11/23/2014 1339   EOSABS 0.2 05/19/2016 1053   BASOSABS 0.0 05/19/2016 1053   Iron/TIBC/Ferritin/ %Sat    Component Value Date/Time   IRON 63 05/19/2016 1105   TIBC 343 05/19/2016 1105   FERRITIN 34 05/19/2016 1105   IRONPCTSAT 18 05/19/2016 1105   Lipid Panel     Component Value Date/Time   CHOL 172 09/01/2016 1231   TRIG 85 09/01/2016 1231   HDL 67 09/01/2016 1231   CHOLHDL 3 09/27/2015 1010   VLDL 21.6 09/27/2015 1010   LDLCALC 88 09/01/2016 1231   Hepatic Function Panel     Component Value Date/Time   PROT 7.1 09/01/2016 1231   ALBUMIN 3.9 09/01/2016 1231   AST 23 09/01/2016 1231   ALT 29 09/01/2016 1231   ALKPHOS 81 09/01/2016 1231   BILITOT 0.2 09/01/2016 1231      Component Value Date/Time   TSH 1.420 05/19/2016 1053   TSH 0.239 (L) 11/26/2014 1325    ASSESSMENT AND PLAN: Other depression  At risk for diabetes mellitus  Morbid obesity ([place])  PLAN:  Depression  with Emotional Eating Behaviors We discussed behavior modification techniques today to help Amanda Murphy deal with her emotional eating and depression. She has agreed to increase  Wellbutrin SR from 150 mg qd to 200 mg qd #30 with no refills and she agreed to follow up as directed.  Diabetes risk counselling Amanda Murphy was given extended (at least 15 minutes) diabetes prevention counseling today. She is 36 y.o. female and has risk factors for diabetes including obesity. We discussed intensive lifestyle modifications today with an emphasis on weight loss as well as increasing exercise and decreasing simple carbohydrates in her diet.  Obesity Amanda Murphy is currently in the action stage of change. As such, her goal is to continue with weight loss efforts She has agreed to follow the Category 3 plan Amanda Murphy has been instructed to work up to a goal of 150 minutes of combined cardio and strengthening exercise per week for weight loss and overall health benefits. We discussed the following Behavioral Modification Stratagies today: decreasing sodium intake, decrease eating out, work on meal planning and easy cooking plans, holiday eating strategies  and emotional eating strategies  Amanda Murphy has agreed to follow up with our clinic in 2 weeks. She was informed of the importance of frequent follow up visits to maximize her success with intensive lifestyle modifications for her multiple health conditions.  I, Doreene Nest, am acting as scribe for Dennard Nip, MD  I have reviewed the above documentation for accuracy and completeness, and I agree with the above. -Dennard Nip, MD

## 2016-09-25 MED FILL — BYDUREON 2 MG PEN INJECT: 2 | 28 days supply | Qty: 4 | Fill #8

## 2016-09-28 MED FILL — BUPROPION HCL SR 200 MG TAB: 200 | 30 days supply | Qty: 30 | Fill #0

## 2016-09-29 ENCOUNTER — Ambulatory Visit (INDEPENDENT_AMBULATORY_CARE_PROVIDER_SITE_OTHER): Payer: 59 | Admitting: Family Medicine

## 2016-09-29 VITALS — BP 127/82 | HR 87 | Temp 98.0°F | Ht 64.0 in | Wt 317.0 lb

## 2016-09-29 DIAGNOSIS — Z9189 Other specified personal risk factors, not elsewhere classified: Secondary | ICD-10-CM | POA: Diagnosis not present

## 2016-09-29 DIAGNOSIS — F3289 Other specified depressive episodes: Secondary | ICD-10-CM | POA: Diagnosis not present

## 2016-09-29 NOTE — Progress Notes (Signed)
Office: [phone]  /  Fax: [phone]   HPI:   Chief Complaint: OBESITY Amanda Murphy is here to discuss her progress with her obesity treatment plan. She is following her eating plan approximately 80 % of the time and states she is exercising at the gym for 30 minutes 2 to 3 times per week. Amanda Murphy has done well with weight loss, even with travelling and eating out. She worked on [employer] protein and increasing H2O. She still struggles with pm snacking. Her weight is (!) 317 lb (143.8 kg) today and has had a weight loss of 5 pounds over a period of 2 weeks since her last visit. She has lost 7 lbs since starting treatment with [place].  Depression with emotional eating behaviors Amanda Murphy is struggling with emotional eating and using food for comfort to the extent that it is negatively impacting her health. She often snacks when she is not hungry. Amanda Murphy sometimes feels she is out of control and then feels guilty that she made poor food choices. She didn't start Wellbutrin yet, just got her prescription yesterday. She is doing better with emotional eating. She has been working on behavior modification techniques to help reduce her emotional eating and has been somewhat successful. She shows no sign of suicidal or homicidal ideations.  At risk for diabetes Amanda Murphy is at higher than average risk for developing diabetes due to her obesity. She currently denies polyuria or polydipsia.  Depression screen [facility] 2/9 05/19/2016  Decreased Interest 2  Down, Depressed, Hopeless 1  PHQ - 2 Score 3  Altered sleeping 1  Tired, decreased energy 3  Change in appetite 3  Feeling bad or failure about yourself  2  Trouble concentrating 1  Moving slowly or fidgety/restless 2  Suicidal thoughts 0  PHQ-9 Score 15     Wt Readings from Last 500 Encounters:  09/29/16 (!) 317 lb (143.8 kg)  09/15/16 (!) 322 lb (146.1 kg)  09/01/16 (!) 315 lb (142.9 kg)  08/18/16 (!) 322 lb (146.1 kg)  07/30/16 (!)  321 lb (145.6 kg)  07/16/16 (!) 322 lb (146.1 kg)  07/01/16 (!) 320 lb (145.2 kg)  06/30/16 (!) 320 lb (145.2 kg)  06/16/16 (!) 320 lb (145.2 kg)  05/28/16 (!) 320 lb 12.8 oz (145.5 kg)  05/19/16 (!) 324 lb 6.4 oz (147.1 kg)  12/27/15 (!) 317 lb (143.8 kg)  11/11/15 (!) 318 lb (144.2 kg)  09/27/15 (!) 314 lb 1.9 oz (142.5 kg)  07/31/15 (!) 307 lb 12.8 oz (139.6 kg)  05/15/15 298 lb (135.2 kg)  01/07/15 272 lb (123.4 kg)  12/25/14 268 lb (121.6 kg)  11/29/14 252 lb 8 oz (114.5 kg)  10/31/14 244 lb 0.8 oz (110.7 kg)  10/19/14 244 lb 11.2 oz (111 kg)  10/19/14 244 lb (110.7 kg)  09/19/14 241 lb 6.4 oz (109.5 kg)  08/16/14 238 lb (108 kg)  05/14/14 240 lb 12.8 oz (109.2 kg)  02/07/14 236 lb (107 kg)  01/18/14 240 lb (108.9 kg)     ALLERGIES: No Known Allergies  MEDICATIONS: Current Outpatient Prescriptions on File Prior to Visit  Medication Sig Dispense Refill   acetaminophen (TYLENOL) 500 MG tablet Take 1,000 mg by mouth every 6 (six) hours as needed for mild pain.     ALPRAZolam (XANAX) 0.5 MG tablet Take 1 tablet (0.5 mg total) by mouth at bedtime as needed for anxiety. 20 tablet 0   aspirin EC 325 MG tablet Take 325 mg by mouth daily.  buPROPion (WELLBUTRIN SR) 200 MG 12 hr tablet Take 1 tablet (200 mg total) by mouth daily. 30 tablet 0   calcium carbonate (TUMS - DOSED IN MG ELEMENTAL CALCIUM) 500 MG chewable tablet Chew 1 tablet by mouth as needed. Reported on 07/31/2015     Cholecalciferol (VITAMIN D3) 50000 units CAPS One tab weekly 4 capsule 0   Cyanocobalamin (VITAMIN B 12 PO) Take 1 tablet by mouth as needed. Reported on 07/31/2015     Exenatide ER 2 MG PEN Inject 2 mg into the skin once a week. 4 each 11   levocetirizine (XYZAL) 5 MG tablet Take 1 tablet (5 mg total) by mouth every evening. 30 tablet 11   Prenatal Vit-Fe Fumarate-FA (PRENATAL MULTIVITAMIN) TABS tablet Take 1 tablet by mouth daily at 12 noon.     Vitamin D, Ergocalciferol, (DRISDOL) 50000  units CAPS capsule Take 1 capsule (50,000 Units total) by mouth every 7 (seven) days. 4 capsule 0   No current facility-administered medications on file prior to visit.     PAST MEDICAL HISTORY: Past Medical History:  Diagnosis Date   Anemia    Asthma    as child    Astigmatism    Fibroids    Headache(784.0)    Hx of blood clots    Joint pain    ankles and hips   PCOS (polycystic ovarian syndrome)    Pneumonia    3-4 yrs ago    Pre-diabetes    Vision abnormalities    Vitamin D deficiency     PAST SURGICAL HISTORY: Past Surgical History:  Procedure Laterality Date   CRANIOTOMY N/A 10/31/2014   Procedure: CRANIOTOMY FOR RESECTION OF TUMOR;  Surgeon: Jovita Gamma, MD;  Location: [place] [facility];  Service: Neurosurgery;  Laterality: N/A;  Craniotomy for resection of tumor   MYOMECTOMY     WISDOM TOOTH EXTRACTION  2006    SOCIAL HISTORY: Social History  Substance Use Topics   Smoking status: Never Smoker   Smokeless tobacco: Never Used   Alcohol use 0.0 oz/week     Comment: occasional    FAMILY HISTORY: Family History  Problem Relation Age of Onset   Hypertension Mother    Diabetes Mother    Obesity Mother    Ovarian cancer     Breast cancer     Fibroids      ROS: Review of Systems  Constitutional: Positive for weight loss.  Genitourinary: Negative for frequency.  Endo/Heme/Allergies: Negative for polydipsia.  Psychiatric/Behavioral: Positive for depression. Negative for suicidal ideas.    PHYSICAL EXAM: Blood pressure 127/82, pulse 87, temperature 98 F (36.7 C), temperature source Oral, height 5\' 4"  (1.626 m), weight (!) 317 lb (143.8 kg), SpO2 98 %. Body mass index is 54.41 kg/m. Physical Exam  Constitutional: She is oriented to person, place, and time. She appears well-developed and well-nourished.  Cardiovascular: Normal rate.   Pulmonary/Chest: Effort normal.  Musculoskeletal: Normal range of motion.  Neurological: She is  oriented to person, place, and time.  Skin: Skin is warm and dry.  Vitals reviewed.   RECENT LABS AND TESTS: BMET    Component Value Date/Time   NA 144 09/01/2016 1231   K 4.5 09/01/2016 1231   CL 103 09/01/2016 1231   CO2 26 09/01/2016 1231   GLUCOSE 81 09/01/2016 1231   GLUCOSE 89 09/27/2015 1010   BUN 9 09/01/2016 1231   CREATININE 0.96 09/01/2016 1231   CALCIUM 9.1 09/01/2016 1231   GFRNONAA 76 09/01/2016 1231  GFRAA 87 09/01/2016 1231   Lab Results  Component Value Date   HGBA1C 5.6 09/01/2016   HGBA1C 5.8 (H) 05/19/2016   HGBA1C 6.1 09/27/2015   HGBA1C 5.8 09/19/2014   Lab Results  Component Value Date   INSULIN 80.9 (H) 09/01/2016   CBC    Component Value Date/Time   WBC 7.5 05/19/2016 1053   WBC 8.1 09/27/2015 1010   RBC 4.50 05/19/2016 1053   RBC 4.23 09/27/2015 1010   HGB 12.5 09/27/2015 1010   HCT 41.6 05/19/2016 1053   PLT 352 05/19/2016 1053   MCV 92 05/19/2016 1053   MCH 30.0 05/19/2016 1053   MCH 28.8 11/29/2014 0423   MCHC 32.5 05/19/2016 1053   MCHC 32.8 09/27/2015 1010   RDW 16.1 (H) 05/19/2016 1053   LYMPHSABS 3.1 05/19/2016 1053   MONOABS 0.4 11/23/2014 1339   EOSABS 0.2 05/19/2016 1053   BASOSABS 0.0 05/19/2016 1053   Iron/TIBC/Ferritin/ %Sat    Component Value Date/Time   IRON 63 05/19/2016 1105   TIBC 343 05/19/2016 1105   FERRITIN 34 05/19/2016 1105   IRONPCTSAT 18 05/19/2016 1105   Lipid Panel     Component Value Date/Time   CHOL 172 09/01/2016 1231   TRIG 85 09/01/2016 1231   HDL 67 09/01/2016 1231   CHOLHDL 3 09/27/2015 1010   VLDL 21.6 09/27/2015 1010   LDLCALC 88 09/01/2016 1231   Hepatic Function Panel     Component Value Date/Time   PROT 7.1 09/01/2016 1231   ALBUMIN 3.9 09/01/2016 1231   AST 23 09/01/2016 1231   ALT 29 09/01/2016 1231   ALKPHOS 81 09/01/2016 1231   BILITOT 0.2 09/01/2016 1231      Component Value Date/Time   TSH 1.420 05/19/2016 1053   TSH 0.239 (L) 11/26/2014 1325    ASSESSMENT  AND PLAN: Other depression  At risk for diabetes mellitus  Morbid obesity (Amanda Murphy)  PLAN:  Depression with Emotional Eating Behaviors We discussed behavior modification techniques today to help Amanda Murphy deal with her emotional eating and depression. She has agreed to start to take Wellbutrin SR 200 mg qd and agreed to follow up as directed.  Diabetes risk counselling Amanda Murphy was given extended (at least 15 minutes) diabetes prevention counseling today. She is 36 y.o. female and has risk factors for diabetes including obesity. We discussed intensive lifestyle modifications today with an emphasis on weight loss as well as increasing exercise and decreasing simple carbohydrates in her diet.  Obesity Amanda Murphy is currently in the action stage of change. As such, her goal is to continue with weight loss efforts She has agreed to follow the Category 3 plan Amanda Murphy has been instructed to work up to a goal of 150 minutes of combined cardio and strengthening exercise per week or increase steps to 5,000 per day or more for weight loss and overall health benefits. We discussed the following Behavioral Modification Stratagies today: increasing lean protein intake, decreasing simple carbohydrates  and increasing vegetables  Amanda Murphy has agreed to follow up with our clinic in 2 weeks. She was informed of the importance of frequent follow up visits to maximize her success with intensive lifestyle modifications for her multiple health conditions.  I, Doreene Nest, am acting as scribe for Dennard Nip, MD  I have reviewed the above documentation for accuracy and completeness, and I agree with the above. -Dennard Nip, MD

## 2016-10-08 NOTE — H&P (Addendum)
Amanda Murphy is an 36 y.o. female with menorrhagia & endometrial mass presents for surgical diagnosis and mngt.  Pt has known fibroids but recent US shows endometrial mass suspicious for polyp or fibroid.  No sexual activity since LMP.      LMP: 11/06/16  Past Medical History:  Diagnosis Date  . Anemia   . Asthma    as child   . Astigmatism   . Fibroids   . Headache(784.0)   . Hx of blood clots   . Joint pain    ankles and hips  . PCOS (polycystic ovarian syndrome)   . Pneumonia    3-4 yrs ago   . Pre-diabetes   . Vision abnormalities   . Vitamin D deficiency     Past Surgical History:  Procedure Laterality Date  . CRANIOTOMY N/A 10/31/2014   Procedure: CRANIOTOMY FOR RESECTION OF TUMOR;  Surgeon: Jovita Gamma, MD;  Location: [place] [facility];  Service: Neurosurgery;  Laterality: N/A;  Craniotomy for resection of tumor  . MYOMECTOMY    . WISDOM TOOTH EXTRACTION  2006    Family History  Problem Relation Age of Onset  . Hypertension Mother   . Diabetes Mother   . Obesity Mother   . Ovarian cancer    . Breast cancer    . Fibroids      Social History:  reports that she has never smoked. She has never used smokeless tobacco. She reports that she drinks alcohol. She reports that she does not use drugs.  Allergies: No Known Allergies  No prescriptions prior to admission.    ROS  AF, VSS Physical Exam  Gen - NAD Abd - NT/ND Ext - NT, no edema  PV [place]:  Multiple subserosal and intramural fibroids - largest 8cm No free fluid Multiple polypoid masses after saline infusion - largest 3cm  Assessment/Plan:  Menorrhagia Hysteroscopy, D&C, myosure resection of endometrial mass R/b/a discussed, questions answered, informed consent  Amanda Murphy 10/08/2016, 8:38 AM

## 2016-10-13 ENCOUNTER — Encounter (INDEPENDENT_AMBULATORY_CARE_PROVIDER_SITE_OTHER): Payer: Self-pay

## 2016-10-13 ENCOUNTER — Ambulatory Visit (INDEPENDENT_AMBULATORY_CARE_PROVIDER_SITE_OTHER): Payer: 59 | Admitting: Family Medicine

## 2016-10-14 ENCOUNTER — Encounter (HOSPITAL_BASED_OUTPATIENT_CLINIC_OR_DEPARTMENT_OTHER): Payer: Self-pay | Admitting: *Deleted

## 2016-10-14 NOTE — Progress Notes (Signed)
Pt instructed npo pmn 5/20 x welbutrin w sip of water. To [facility] 5/21 @ 0530.  Needs T&S on arrival.  Pt to come for cbc, cmet Fri, 5/11. Pt' bmi is 73.  Chart to be reviewed by MDA.

## 2016-10-16 NOTE — Progress Notes (Signed)
PT WAS RESCHEDULED TO GET HER LAB WORK DONE TODAY BUT NO SHOW.  CALLED AND LM FOR PT VIA PHONE TO PLEASE CALL HEATHER, OR SCHEDULER FOR DR [name] FIRST [facility] Monday 10-19-2016 MORNING BECAUSE HER CASE IS BEING MOVED TO [place]. HEATHER'S PHONE NUMBER [email or web address].

## 2016-10-16 NOTE — Progress Notes (Signed)
Spoke w/ dr rose mda to review pt chart.  Pt bmi is 54.4.  Per dr rose mda over guideline of bmi 61 and is not a ambulatory surgery center candidate. Called and spoke w/ heather via phone and relay that would need to be moved.

## 2016-10-22 ENCOUNTER — Ambulatory Visit (INDEPENDENT_AMBULATORY_CARE_PROVIDER_SITE_OTHER): Payer: 59 | Admitting: Family Medicine

## 2016-10-22 VITALS — BP 108/69 | HR 89 | Temp 98.5°F | Ht 64.0 in | Wt 313.0 lb

## 2016-10-22 DIAGNOSIS — E559 Vitamin D deficiency, unspecified: Secondary | ICD-10-CM

## 2016-10-22 DIAGNOSIS — F3289 Other specified depressive episodes: Secondary | ICD-10-CM

## 2016-10-22 DIAGNOSIS — F32A Depression, unspecified: Secondary | ICD-10-CM | POA: Insufficient documentation

## 2016-10-22 DIAGNOSIS — F329 Major depressive disorder, single episode, unspecified: Secondary | ICD-10-CM | POA: Insufficient documentation

## 2016-10-22 MED ORDER — VITAMIN D (ERGOCALCIFEROL) 1.25 MG (50000 UNIT) PO CAPS: 50000.0000 [IU] | ORAL_CAPSULE | ORAL | 0 refills | Status: DC

## 2016-10-22 MED ORDER — BUPROPION HCL ER (SR) 200 MG PO TB12: 200.0000 mg | ORAL_TABLET | Freq: Every day | ORAL | 0 refills | Status: DC

## 2016-10-22 NOTE — Progress Notes (Signed)
Office: [phone]  /  Fax: [phone]   HPI:   Chief Complaint: OBESITY Amanda Murphy is here to discuss her progress with her obesity treatment plan. She is on the  keep a food journal and follow the Category 3 plan and is following her eating plan approximately 90 % of the time. She states she is exercising water and walking 50 to 60 minutes 3 times per week. Amanda Murphy continues to do well with weight loss. She has increased steps and had some days when she did 10,000 steps. Hunger is controlled. She is still struggling at times with cravings and temptations. Her weight is (!) 313 lb (142 kg) today and has had a weight loss of 4 pounds over a period of 3 weeks since her last visit. She has lost 11 lbs since starting treatment with [place].  Vitamin D deficiency Amanda Murphy has a diagnosis of vitamin D deficiency. She has been off vit D in the last month, so likely dropping. She denies nausea, vomiting or muscle weakness.  Depression with emotional eating behaviors Amanda Murphy's mood is good on Wellbutrin. Amanda Murphy struggles with emotional eating and using food for comfort to the extent that it is negatively impacting her health. She often snacks when she is not hungry. Amanda Murphy sometimes feels she is out of control and then feels guilty that she made poor food choices. She has been working on behavior modification techniques to help reduce her emotional eating and has been somewhat successful. Her blood pressure is well controlled and she denies insomnia. Amanda Murphy shows no sign of suicidal or homicidal ideations.  Depression screen PHQ 2/9 05/19/2016  Decreased Interest 2  Down, Depressed, Hopeless 1  PHQ - 2 Score 3  Altered sleeping 1  Tired, decreased energy 3  Change in appetite 3  Feeling bad or failure about yourself  2  Trouble concentrating 1  Moving slowly or fidgety/restless 2  Suicidal thoughts 0  PHQ-9 Score 15     ALLERGIES: No Known Allergies  MEDICATIONS: Current  Outpatient Prescriptions on File Prior to Visit  Medication Sig Dispense Refill   acetaminophen (TYLENOL) 500 MG tablet Take 1,000 mg by mouth every 6 (six) hours as needed for mild pain.     ALPRAZolam (XANAX) 0.5 MG tablet Take 1 tablet (0.5 mg total) by mouth at bedtime as needed for anxiety. 20 tablet 0   aspirin EC 325 MG tablet Take 325 mg by mouth daily.     buPROPion (WELLBUTRIN SR) 200 MG 12 hr tablet Take 1 tablet (200 mg total) by mouth daily. 30 tablet 0   calcium carbonate (TUMS - DOSED IN MG ELEMENTAL CALCIUM) 500 MG chewable tablet Chew 1 tablet by mouth as needed. Reported on 07/31/2015     Cholecalciferol (VITAMIN D3) 50000 units CAPS One tab weekly 4 capsule 0   Cyanocobalamin (VITAMIN B 12 PO) Take 1 tablet by mouth as needed. Reported on 07/31/2015     docusate sodium (COLACE) 100 MG capsule Take 100 mg by mouth daily as needed for mild constipation.     Exenatide ER 2 MG PEN Inject 2 mg into the skin once a week. 4 each 11   levocetirizine (XYZAL) 5 MG tablet Take 1 tablet (5 mg total) by mouth every evening. 30 tablet 11   Prenatal Vit-Fe Fumarate-FA (PRENATAL MULTIVITAMIN) TABS tablet Take 1 tablet by mouth daily at 12 noon.     Vitamin D, Ergocalciferol, (DRISDOL) 50000 units CAPS capsule Take 1 capsule (50,000 Units total) by mouth every  7 (seven) days. 4 capsule 0   No current facility-administered medications on file prior to visit.     PAST MEDICAL HISTORY: Past Medical History:  Diagnosis Date   Abnormal uterine bleeding (AUB)    Anemia    Asthma    as child    Astigmatism    Fibroids    H/O diabetes insipidus    s/p craniotomy   Headache(784.0)    Hx of blood clots 2016   P. E.  p craniotomy. on Xarelto until 2017.   Joint pain    ankles and hips   PCOS (polycystic ovarian syndrome)    Pneumonia    3-4 yrs ago    Pre-diabetes    Vision abnormalities    Vitamin D deficiency    Wears glasses     PAST SURGICAL  HISTORY: Past Surgical History:  Procedure Laterality Date   CRANIOTOMY N/A 10/31/2014   Procedure: CRANIOTOMY FOR RESECTION OF TUMOR;  Surgeon: Jovita Gamma, MD;  Location: [facility];  Service: Neurosurgery;  Laterality: N/A;  Craniotomy for resection of tumor   MYOMECTOMY     WISDOM TOOTH EXTRACTION  2006    SOCIAL HISTORY: Social History  Substance Use Topics   Smoking status: Never Smoker   Smokeless tobacco: Never Used   Alcohol use 0.0 oz/week     Comment: occasional    FAMILY HISTORY: Family History  Problem Relation Age of Onset   Hypertension Mother    Diabetes Mother    Obesity Mother    Ovarian cancer Unknown    Breast cancer Unknown    Fibroids Unknown     ROS: Review of Systems  Constitutional: Positive for weight loss.  Gastrointestinal: Negative for nausea and vomiting.  Musculoskeletal:       Negative muscle weakness  Psychiatric/Behavioral: Positive for depression. Negative for suicidal ideas.    PHYSICAL EXAM: Blood pressure 108/69, pulse 89, temperature 98.5 F (36.9 C), temperature source Oral, height 5\' 4"  (1.626 m), weight (!) 313 lb (142 kg), SpO2 94 %. Body mass index is 53.73 kg/m. Physical Exam  Constitutional: She is oriented to person, place, and time. She appears well-developed and well-nourished.  Cardiovascular: Normal rate.   Pulmonary/Chest: Effort normal.  Musculoskeletal: Normal range of motion.  Neurological: She is oriented to person, place, and time.  Skin: Skin is warm.  Psychiatric: She has a normal mood and affect. Her behavior is normal.  Vitals reviewed.   RECENT LABS AND TESTS: BMET    Component Value Date/Time   NA 144 09/01/2016 1231   K 4.5 09/01/2016 1231   CL 103 09/01/2016 1231   CO2 26 09/01/2016 1231   GLUCOSE 81 09/01/2016 1231   GLUCOSE 89 09/27/2015 1010   BUN 9 09/01/2016 1231   CREATININE 0.96 09/01/2016 1231   CALCIUM 9.1 09/01/2016 1231   GFRNONAA 76 09/01/2016 1231   GFRAA  87 09/01/2016 1231   Lab Results  Component Value Date   HGBA1C 5.6 09/01/2016   HGBA1C 5.8 (H) 05/19/2016   HGBA1C 6.1 09/27/2015   HGBA1C 5.8 09/19/2014   Lab Results  Component Value Date   INSULIN 80.9 (H) 09/01/2016   CBC    Component Value Date/Time   WBC 7.5 05/19/2016 1053   WBC 8.1 09/27/2015 1010   RBC 4.50 05/19/2016 1053   RBC 4.23 09/27/2015 1010   HGB 12.5 09/27/2015 1010   HCT 41.6 05/19/2016 1053   PLT 352 05/19/2016 1053   MCV 92 05/19/2016 1053  MCH 30.0 05/19/2016 1053   MCH 28.8 11/29/2014 0423   MCHC 32.5 05/19/2016 1053   MCHC 32.8 09/27/2015 1010   RDW 16.1 (H) 05/19/2016 1053   LYMPHSABS 3.1 05/19/2016 1053   MONOABS 0.4 11/23/2014 1339   EOSABS 0.2 05/19/2016 1053   BASOSABS 0.0 05/19/2016 1053   Iron/TIBC/Ferritin/ %Sat    Component Value Date/Time   IRON 63 05/19/2016 1105   TIBC 343 05/19/2016 1105   FERRITIN 34 05/19/2016 1105   IRONPCTSAT 18 05/19/2016 1105   Lipid Panel     Component Value Date/Time   CHOL 172 09/01/2016 1231   TRIG 85 09/01/2016 1231   HDL 67 09/01/2016 1231   CHOLHDL 3 09/27/2015 1010   VLDL 21.6 09/27/2015 1010   LDLCALC 88 09/01/2016 1231   Hepatic Function Panel     Component Value Date/Time   PROT 7.1 09/01/2016 1231   ALBUMIN 3.9 09/01/2016 1231   AST 23 09/01/2016 1231   ALT 29 09/01/2016 1231   ALKPHOS 81 09/01/2016 1231   BILITOT 0.2 09/01/2016 1231      Component Value Date/Time   TSH 1.420 05/19/2016 1053   TSH 0.239 (L) 11/26/2014 1325    ASSESSMENT AND PLAN: Vitamin D deficiency - Plan: Vitamin D, Ergocalciferol, (DRISDOL) 50000 units CAPS capsule  Other depression - Plan: buPROPion (WELLBUTRIN SR) 200 MG 12 hr tablet  Morbid obesity ([place]) - BMI 53.9  PLAN:  Vitamin D Deficiency Amanda Murphy was informed that low vitamin D levels contributes to fatigue and are associated with obesity, breast, and colon cancer. She agrees to continue to take prescription Vit D @50 ,000 IU every week,  we will refill for 1 month and will follow up for routine testing of vitamin D, at least 2-3 times per year. She was informed of the risk of over-replacement of vitamin D and agrees to not increase her dose unless he discusses this with [place] first. Amanda Murphy agrees to follow up with our clinic in 2 weeks.  Depression with Emotional Eating Behaviors We discussed behavior modification techniques today to help Amanda Murphy deal with her emotional eating and depression. She has agreed to continue to  take Wellbutrin SR 200 mg qd, we will refill for 1 month and will follow up as directed.  Obesity Amanda Murphy is currently in the action stage of change. As such, her goal is to continue with weight loss efforts She has agreed to follow the Category 3 plan Amanda Murphy has been instructed to work up to a goal of 150 minutes of combined cardio and strengthening exercise per week for weight loss and overall health benefits. We discussed the following Behavioral Modification Strategies today: increasing lean protein intake, increasing lower sugar fruits and decreasing sodium intake  Amanda Murphy has agreed to follow up with our clinic in 2 weeks. She was informed of the importance of frequent follow up visits to maximize her success with intensive lifestyle modifications for her multiple health conditions.  I, Doreene Nest, am acting as scribe for Dennard Nip, MD  I have reviewed the above documentation for accuracy and completeness, and I agree with the above. -Dennard Nip, MD

## 2016-10-23 MED FILL — VIT D2 1.25 MG (50,000 UNIT: 1.25 MG | 28 days supply | Qty: 4 | Fill #0

## 2016-10-23 MED FILL — BUPROPION HCL SR 200 MG TAB: 200 | 30 days supply | Qty: 30 | Fill #0

## 2016-10-30 ENCOUNTER — Encounter (HOSPITAL_COMMUNITY): Payer: Self-pay | Admitting: *Deleted

## 2016-11-05 ENCOUNTER — Ambulatory Visit (INDEPENDENT_AMBULATORY_CARE_PROVIDER_SITE_OTHER): Payer: 59 | Admitting: Family Medicine

## 2016-11-05 VITALS — BP 118/76 | HR 83 | Temp 98.4°F | Ht 64.0 in | Wt 316.0 lb

## 2016-11-05 DIAGNOSIS — F3289 Other specified depressive episodes: Secondary | ICD-10-CM | POA: Diagnosis not present

## 2016-11-05 NOTE — Progress Notes (Signed)
Office: [phone]  /  Fax: [phone]   HPI:   Chief Complaint: OBESITY Amanda Murphy is here to discuss her progress with her obesity treatment plan. She is on the  follow the Category 3 plan and is following her eating plan approximately 90 % of the time. She states she is walking 30 to 45 minutes 3 times per week. Amanda Murphy has increase in water weight. Hunger is controlled but she is still struggling with craving and not eating enough protein. She is getting ready to have surgery and is worried about stress eating afterwards. Her weight is (!) 316 lb (143.3 kg) today and has had a weight gain of 3 pounds over a period of 2 weeks since her last visit. She has lost 8 lbs since starting treatment with [place].  Depression with emotional eating behaviors Amanda Murphy is on Wellbutrin, mood is stable but she is still struggling with some emotional eating and using food for comfort to the extent that it is negatively impacting her health. She often snacks when she is not hungry. Amanda Murphy sometimes feels she is out of control and then feels guilty that she made poor food choices. She has been working on behavior modification techniques to help reduce her emotional eating and has been somewhat successful. Her blood pressure is stable and she has no insomnia. She shows no sign of suicidal or homicidal ideations.  Depression screen PHQ 2/9 05/19/2016  Decreased Interest 2  Down, Depressed, Hopeless 1  PHQ - 2 Score 3  Altered sleeping 1  Tired, decreased energy 3  Change in appetite 3  Feeling bad or failure about yourself  2  Trouble concentrating 1  Moving slowly or fidgety/restless 2  Suicidal thoughts 0  PHQ-9 Score 15      ALLERGIES: No Known Allergies  MEDICATIONS: Current Outpatient Prescriptions on File Prior to Visit  Medication Sig Dispense Refill  . acetaminophen (TYLENOL) 500 MG tablet Take 1,000 mg by mouth every 6 (six) hours as needed for mild pain.    [name] ALPRAZolam (XANAX) 0.5  MG tablet Take 1 tablet (0.5 mg total) by mouth at bedtime as needed for anxiety. 20 tablet 0  . aspirin EC 325 MG tablet Take 325 mg by mouth daily.    [name] buPROPion (WELLBUTRIN SR) 200 MG 12 hr tablet Take 1 tablet (200 mg total) by mouth daily. 30 tablet 0  . calcium carbonate (TUMS - DOSED IN MG ELEMENTAL CALCIUM) 500 MG chewable tablet Chew 1 tablet by mouth as needed. Reported on 07/31/2015    . docusate sodium (COLACE) 100 MG capsule Take 100 mg by mouth daily as needed for mild constipation.    . Exenatide ER 2 MG PEN Inject 2 mg into the skin once a week. 4 each 11  . levocetirizine (XYZAL) 5 MG tablet Take 1 tablet (5 mg total) by mouth every evening. (Patient taking differently: Take 5 mg by mouth daily as needed. ) 30 tablet 11  . Prenatal Vit-Fe Fumarate-FA (PRENATAL MULTIVITAMIN) TABS tablet Take 1 tablet by mouth daily.     . Vitamin D, Ergocalciferol, (DRISDOL) 50000 units CAPS capsule Take 1 capsule (50,000 Units total) by mouth every 7 (seven) days. 4 capsule 0   No current facility-administered medications on file prior to visit.     PAST MEDICAL HISTORY: Past Medical History:  Diagnosis Date  . Abnormal uterine bleeding (AUB)   . Anemia   . Asthma    as child   . Astigmatism   .  Fibroids   . H/O diabetes insipidus    s/p craniotomy  . Headache(784.0)   . Hx of blood clots 2016   P. E.  p craniotomy. on Xarelto until 2017.  . Joint pain    ankles and hips  . PCOS (polycystic ovarian syndrome)   . Pneumonia    3-4 yrs ago   . Pre-diabetes   . Vision abnormalities   . Vitamin D deficiency   . Wears glasses     PAST SURGICAL HISTORY: Past Surgical History:  Procedure Laterality Date  . CRANIOTOMY N/A 10/31/2014   Procedure: CRANIOTOMY FOR RESECTION OF TUMOR;  Surgeon: Jovita Gamma, MD;  Location: [facility];  Service: Neurosurgery;  Laterality: N/A;  Craniotomy for resection of tumor  . MYOMECTOMY    . WISDOM TOOTH EXTRACTION  2006    SOCIAL  HISTORY: Social History  Substance Use Topics  . Smoking status: Never Smoker  . Smokeless tobacco: Never Used  . Alcohol use 0.0 oz/week     Comment: occasional    FAMILY HISTORY: Family History  Problem Relation Age of Onset  . Hypertension Mother   . Diabetes Mother   . Obesity Mother   . Ovarian cancer Unknown   . Breast cancer Unknown   . Fibroids Unknown     ROS: Review of Systems  Constitutional: Negative for weight loss.  Psychiatric/Behavioral: Positive for depression. Negative for suicidal ideas. The patient does not have insomnia.     PHYSICAL EXAM: Blood pressure 118/76, pulse 83, temperature 98.4 F (36.9 C), temperature source Oral, height 5\' 4"  (1.626 m), weight (!) 316 lb (143.3 kg), SpO2 96 %. Body mass index is 54.24 kg/m. Physical Exam  Constitutional: She is oriented to person, place, and time. She appears well-developed and well-nourished.  Cardiovascular: Normal rate.   Pulmonary/Chest: Effort normal.  Musculoskeletal: Normal range of motion.  Neurological: She is oriented to person, place, and time.  Skin: Skin is warm and dry.  Vitals reviewed.   RECENT LABS AND TESTS: BMET    Component Value Date/Time   NA 144 09/01/2016 1231   K 4.5 09/01/2016 1231   CL 103 09/01/2016 1231   CO2 26 09/01/2016 1231   GLUCOSE 81 09/01/2016 1231   GLUCOSE 89 09/27/2015 1010   BUN 9 09/01/2016 1231   CREATININE 0.96 09/01/2016 1231   CALCIUM 9.1 09/01/2016 1231   GFRNONAA 76 09/01/2016 1231   GFRAA 87 09/01/2016 1231   Lab Results  Component Value Date   HGBA1C 5.6 09/01/2016   HGBA1C 5.8 (H) 05/19/2016   HGBA1C 6.1 09/27/2015   HGBA1C 5.8 09/19/2014   Lab Results  Component Value Date   INSULIN 80.9 (H) 09/01/2016   CBC    Component Value Date/Time   WBC 7.5 05/19/2016 1053   WBC 8.1 09/27/2015 1010   RBC 4.50 05/19/2016 1053   RBC 4.23 09/27/2015 1010   HGB 12.5 09/27/2015 1010   HCT 41.6 05/19/2016 1053   PLT 352 05/19/2016 1053    MCV 92 05/19/2016 1053   MCH 30.0 05/19/2016 1053   MCH 28.8 11/29/2014 0423   MCHC 32.5 05/19/2016 1053   MCHC 32.8 09/27/2015 1010   RDW 16.1 (H) 05/19/2016 1053   LYMPHSABS 3.1 05/19/2016 1053   MONOABS 0.4 11/23/2014 1339   EOSABS 0.2 05/19/2016 1053   BASOSABS 0.0 05/19/2016 1053   Iron/TIBC/Ferritin/ %Sat    Component Value Date/Time   IRON 63 05/19/2016 1105   TIBC 343 05/19/2016 1105  FERRITIN 34 05/19/2016 1105   IRONPCTSAT 18 05/19/2016 1105   Lipid Panel     Component Value Date/Time   CHOL 172 09/01/2016 1231   TRIG 85 09/01/2016 1231   HDL 67 09/01/2016 1231   CHOLHDL 3 09/27/2015 1010   VLDL 21.6 09/27/2015 1010   LDLCALC 88 09/01/2016 1231   Hepatic Function Panel     Component Value Date/Time   PROT 7.1 09/01/2016 1231   ALBUMIN 3.9 09/01/2016 1231   AST 23 09/01/2016 1231   ALT 29 09/01/2016 1231   ALKPHOS 81 09/01/2016 1231   BILITOT 0.2 09/01/2016 1231      Component Value Date/Time   TSH 1.420 05/19/2016 1053   TSH 0.239 (L) 11/26/2014 1325    ASSESSMENT AND PLAN: Other depression  Morbid obesity ([facility])  PLAN:  Depression with Emotional Eating Behaviors We discussed behavior modification techniques today to help Amanda Murphy deal with her emotional eating and depression. She has agreed to continue to take Wellbutrin SR 200 mg qd and agreed to follow up as directed. We discussed cognitive behavioral therapy to help decrease emotional eating.  We spent > than 50% of the 15 minute visit on the counseling as documented in the note.  Obesity Amanda Murphy is currently in the action stage of change. As such, her goal is to continue with weight loss efforts She has agreed to keep a food journal with 1500 to 1700 calories and 90 grams of protein  Amanda Murphy has been instructed to work up to a goal of 150 minutes of combined cardio and strengthening exercise per week for weight loss and overall health benefits. We discussed the following Behavioral  Modification Strategies today: post-op eating strategies, increasing lean protein intake and work on meal planning and easy cooking plans  Amanda Murphy has agreed to follow up with our clinic in 2 weeks. She was informed of the importance of frequent follow up visits to maximize her success with intensive lifestyle modifications for her multiple health conditions.  I, Doreene Nest, am acting as scribe for Dennard Nip, MD  I have reviewed the above documentation for accuracy and completeness, and I agree with the above. -Dennard Nip, MD  Office: [phone]  /  Fax: [phone]  OBESITY BEHAVIORAL INTERVENTION VISIT  Today's visit was # 12 out of 22.  Starting weight: 324 lbs Starting date: 05/19/16 Today's weight : 316 lbs Today's date: 11/05/2016 Total lbs lost to date: 8 (Patients must lose 7 lbs in the first 6 months to continue with counseling)   ASK: We discussed the diagnosis of obesity with Amanda Murphy today and Amanda Murphy agreed to give [place] permission to discuss obesity behavioral modification therapy today.  ASSESS: Amanda Murphy has the diagnosis of obesity and her BMI today is @TBMI @ Amanda Murphy is in the action stage of change   ADVISE: Amanda Murphy was educated on the multiple health risks of obesity as well as the benefit of weight loss to improve her health. She was advised of the need for long term treatment and the importance of lifestyle modifications.  AGREE: Multiple dietary modification options and treatment options were discussed and  Amanda Murphy agreed to keep a food journal with 1500 to 1700 calories and 90 grams of protein  We discussed the following Behavioral Modification Strategies today: post-op eating strategies, increasing lean protein intake and work on meal planning and easy cooking plans

## 2016-11-05 NOTE — Patient Instructions (Addendum)
Your procedure is scheduled on:  Thursday, June 7  Enter through the Main Entrance of [facility] at: 11:30 am  Pick up the phone at the desk and dial [phone].  Call this number if you have problems the morning of surgery: [phone].  Remember: Do NOT eat food after midnight Wednesday  Do NOT drink clear liquids after 7 am Thursday, day of surgery  Take these medicines the morning of surgery with a SIP OF WATER:  wellbutrin  Do NOT wear jewelry (body piercing), metal hair clips/bobby pins, make-up, or nail polish. Do NOT wear lotions, powders, or perfumes.  You may wear deoderant. Do NOT shave for 48 hours prior to surgery. Do NOT bring valuables to the hospital.  Have a responsible adult drive you home and stay with you for 24 hours after your procedure.  Home with Mother Bryson Ha cell [phone]

## 2016-11-06 ENCOUNTER — Encounter (HOSPITAL_COMMUNITY)
Admission: RE | Admit: 2016-11-06 | Discharge: 2016-11-06 | Disposition: A | Discharge status: Home / Self Care | Payer: 59 | Source: Ambulatory Visit | Attending: Obstetrics and Gynecology | Admitting: Obstetrics and Gynecology

## 2016-11-06 ENCOUNTER — Encounter (HOSPITAL_COMMUNITY): Payer: Self-pay

## 2016-11-06 DIAGNOSIS — Z01818 Encounter for other preprocedural examination: Secondary | ICD-10-CM | POA: Diagnosis not present

## 2016-11-06 DIAGNOSIS — N840 Polyp of corpus uteri: Secondary | ICD-10-CM | POA: Diagnosis not present

## 2016-11-06 DIAGNOSIS — N920 Excessive and frequent menstruation with regular cycle: Secondary | ICD-10-CM | POA: Diagnosis not present

## 2016-11-06 HISTORY — DX: Other seasonal allergic rhinitis: J30.2

## 2016-11-06 HISTORY — DX: Anxiety disorder, unspecified: F41.9

## 2016-11-06 LAB — COMPREHENSIVE METABOLIC PANEL
ALBUMIN: 3.1 g/dL — AB (ref 3.5–5.0)
ALT: 24 U/L (ref 14–54)
AST: 20 U/L (ref 15–41)
Alkaline Phosphatase: 81 U/L (ref 38–126)
Anion gap: 8 (ref 5–15)
BILIRUBIN TOTAL: 0.2 mg/dL — AB (ref 0.3–1.2)
BUN: 10 mg/dL (ref 6–20)
CHLORIDE: 102 mmol/L (ref 101–111)
CO2: 26 mmol/L (ref 22–32)
Calcium: 9.1 mg/dL (ref 8.9–10.3)
Creatinine, Ser: 0.92 mg/dL (ref 0.44–1.00)
GFR calc Af Amer: >60 mL/min (ref >60)
GFR calc non Af Amer: >60 mL/min (ref >60)
GLUCOSE: 92 mg/dL (ref 65–99)
POTASSIUM: 4.1 mmol/L (ref 3.5–5.1)
SODIUM: 136 mmol/L (ref 135–145)
TOTAL PROTEIN: 7.2 g/dL (ref 6.5–8.1)

## 2016-11-06 LAB — CBC
HEMATOCRIT: 32.7 % — AB (ref 36.0–46.0)
HEMOGLOBIN: 10.2 g/dL — AB (ref 12.0–15.0)
MCH: 25.8 pg — ABNORMAL LOW (ref 26.0–34.0)
MCHC: 31.2 g/dL (ref 30.0–36.0)
MCV: 82.8 fL (ref 78.0–100.0)
Platelets: 489 10*3/uL — ABNORMAL HIGH (ref 150–400)
RBC: 3.95 MIL/uL (ref 3.87–5.11)
RDW: 16.2 % — AB (ref 11.5–15.5)
WBC: 10.9 10*3/uL — AB (ref 4.0–10.5)

## 2016-11-06 LAB — TYPE AND SCREEN
ABO/RH(D): A POS
Antibody Screen: NEGATIVE

## 2016-11-06 LAB — ABO/RH: ABO/RH(D): A POS

## 2016-11-12 ENCOUNTER — Ambulatory Visit (HOSPITAL_COMMUNITY): Payer: 59 | Admitting: Registered Nurse

## 2016-11-12 ENCOUNTER — Encounter (HOSPITAL_COMMUNITY): Payer: Self-pay

## 2016-11-12 ENCOUNTER — Encounter (HOSPITAL_COMMUNITY)
Admission: RE | Disposition: A | Discharge status: Home / Self Care | Payer: Self-pay | Source: Ambulatory Visit | Attending: Obstetrics and Gynecology

## 2016-11-12 ENCOUNTER — Encounter (INDEPENDENT_AMBULATORY_CARE_PROVIDER_SITE_OTHER): Payer: Self-pay | Admitting: Family Medicine

## 2016-11-12 ENCOUNTER — Ambulatory Visit (HOSPITAL_COMMUNITY)
Admission: RE | Admit: 2016-11-12 | Discharge: 2016-11-12 | Disposition: A | Discharge status: Home / Self Care | Payer: 59 | Source: Ambulatory Visit | Attending: Obstetrics and Gynecology | Admitting: Obstetrics and Gynecology

## 2016-11-12 DIAGNOSIS — N920 Excessive and frequent menstruation with regular cycle: Secondary | ICD-10-CM | POA: Diagnosis not present

## 2016-11-12 DIAGNOSIS — Z7982 Long term (current) use of aspirin: Secondary | ICD-10-CM | POA: Insufficient documentation

## 2016-11-12 DIAGNOSIS — Z6841 Body Mass Index (BMI) 40.0 and over, adult: Secondary | ICD-10-CM | POA: Diagnosis not present

## 2016-11-12 DIAGNOSIS — F419 Anxiety disorder, unspecified: Secondary | ICD-10-CM | POA: Diagnosis not present

## 2016-11-12 DIAGNOSIS — Z79899 Other long term (current) drug therapy: Secondary | ICD-10-CM | POA: Insufficient documentation

## 2016-11-12 DIAGNOSIS — N840 Polyp of corpus uteri: Secondary | ICD-10-CM | POA: Insufficient documentation

## 2016-11-12 HISTORY — DX: Presence of spectacles and contact lenses: Z97.3

## 2016-11-12 HISTORY — PX: DILATATION & CURETTAGE/HYSTEROSCOPY WITH MYOSURE: SHX6511

## 2016-11-12 HISTORY — DX: Abnormal uterine and vaginal bleeding, unspecified: N93.9

## 2016-11-12 HISTORY — DX: Personal history of other endocrine, nutritional and metabolic disease: Z86.39

## 2016-11-12 SURGERY — DILATATION & CURETTAGE/HYSTEROSCOPY WITH MYOSURE
Anesthesia: General | Site: Vagina

## 2016-11-12 MED ORDER — MIDAZOLAM HCL 5 MG/5ML IJ SOLN
INTRAMUSCULAR | Status: DC | PRN
  Administered 2016-11-12: 2 mg via INTRAVENOUS

## 2016-11-12 MED ORDER — KETOROLAC TROMETHAMINE 30 MG/ML IJ SOLN: 30.0000 mg | Freq: Once | INTRAMUSCULAR | Status: DC | PRN

## 2016-11-12 MED ORDER — PROPOFOL 10 MG/ML IV BOLUS
INTRAVENOUS | Status: DC | PRN
  Administered 2016-11-12: 200 mg via INTRAVENOUS

## 2016-11-12 MED ORDER — SCOPOLAMINE 1 MG/3DAYS TD PT72
MEDICATED_PATCH | TRANSDERMAL | Status: AC
  Administered 2016-11-12: 1.5 mg via TRANSDERMAL
  Filled 2016-11-12: qty 1

## 2016-11-12 MED ORDER — PROPOFOL 10 MG/ML IV BOLUS
INTRAVENOUS | Status: AC
  Filled 2016-11-12: qty 20

## 2016-11-12 MED ORDER — LIDOCAINE HCL (CARDIAC) 20 MG/ML IV SOLN
INTRAVENOUS | Status: AC
  Filled 2016-11-12: qty 5

## 2016-11-12 MED ORDER — MEPERIDINE HCL 25 MG/ML IJ SOLN: 6.2500 mg | INTRAMUSCULAR | Status: DC | PRN

## 2016-11-12 MED ORDER — LACTATED RINGERS IV SOLN
INTRAVENOUS | Status: DC
  Administered 2016-11-12 (×2): via INTRAVENOUS

## 2016-11-12 MED ORDER — LIDOCAINE HCL 1 % IJ SOLN
INTRAMUSCULAR | Status: DC | PRN
  Administered 2016-11-12: 10 mL

## 2016-11-12 MED ORDER — FENTANYL CITRATE (PF) 100 MCG/2ML IJ SOLN
INTRAMUSCULAR | Status: AC
  Filled 2016-11-12: qty 2

## 2016-11-12 MED ORDER — CEFOTETAN DISODIUM-DEXTROSE 2-2.08 GM-% IV SOLR
INTRAVENOUS | Status: AC
  Filled 2016-11-12: qty 50

## 2016-11-12 MED ORDER — HYDROMORPHONE HCL 1 MG/ML IJ SOLN: 0.2500 mg | INTRAMUSCULAR | Status: DC | PRN

## 2016-11-12 MED ORDER — KETOROLAC TROMETHAMINE 30 MG/ML IJ SOLN
INTRAMUSCULAR | Status: AC
  Filled 2016-11-12: qty 1

## 2016-11-12 MED ORDER — MIDAZOLAM HCL 2 MG/2ML IJ SOLN
INTRAMUSCULAR | Status: AC
  Filled 2016-11-12: qty 2

## 2016-11-12 MED ORDER — IBUPROFEN 600 MG PO TABS: 600.0000 mg | ORAL_TABLET | Freq: Four times a day (QID) | ORAL | 0 refills | Status: DC | PRN

## 2016-11-12 MED ORDER — DEXAMETHASONE SODIUM PHOSPHATE 10 MG/ML IJ SOLN
INTRAMUSCULAR | Status: DC | PRN
  Administered 2016-11-12: 10 mg via INTRAVENOUS

## 2016-11-12 MED ORDER — ONDANSETRON HCL 4 MG/2ML IJ SOLN
INTRAMUSCULAR | Status: AC
  Filled 2016-11-12: qty 2

## 2016-11-12 MED ORDER — SCOPOLAMINE 1 MG/3DAYS TD PT72
1.0000 | MEDICATED_PATCH | Freq: Once | TRANSDERMAL | Status: DC
  Administered 2016-11-12: 1.5 mg via TRANSDERMAL

## 2016-11-12 MED ORDER — DEXAMETHASONE SODIUM PHOSPHATE 10 MG/ML IJ SOLN
INTRAMUSCULAR | Status: AC
  Filled 2016-11-12: qty 1

## 2016-11-12 MED ORDER — ONDANSETRON HCL 4 MG/2ML IJ SOLN
INTRAMUSCULAR | Status: DC | PRN
Start: 2016-11-12 — End: 2016-11-12
  Administered 2016-11-12: 4 mg via INTRAVENOUS

## 2016-11-12 MED ORDER — KETOROLAC TROMETHAMINE 30 MG/ML IJ SOLN
INTRAMUSCULAR | Status: DC | PRN
  Administered 2016-11-12: 30 mg via INTRAVENOUS

## 2016-11-12 MED ORDER — SODIUM CHLORIDE 0.9 % IR SOLN
Status: DC | PRN
  Administered 2016-11-12: 3000 mL

## 2016-11-12 MED ORDER — LIDOCAINE 2% (20 MG/ML) 5 ML SYRINGE
INTRAMUSCULAR | Status: DC | PRN
  Administered 2016-11-12: 100 mg via INTRAVENOUS

## 2016-11-12 MED ORDER — LIDOCAINE HCL 1 % IJ SOLN
INTRAMUSCULAR | Status: AC
  Filled 2016-11-12: qty 20

## 2016-11-12 MED ORDER — CEFOTETAN DISODIUM-DEXTROSE 2-2.08 GM-% IV SOLR
2.0000 g | INTRAVENOUS | Status: AC
  Administered 2016-11-12: 2 g via INTRAVENOUS

## 2016-11-12 MED ORDER — FENTANYL CITRATE (PF) 100 MCG/2ML IJ SOLN
INTRAMUSCULAR | Status: DC | PRN
  Administered 2016-11-12 (×2): 50 ug via INTRAVENOUS

## 2016-11-12 MED ORDER — OXYCODONE-ACETAMINOPHEN 5-325 MG PO TABS: 1.0000 | ORAL_TABLET | ORAL | 0 refills | Status: DC | PRN

## 2016-11-12 MED ORDER — PROMETHAZINE HCL 25 MG/ML IJ SOLN: 6.2500 mg | INTRAMUSCULAR | Status: DC | PRN

## 2016-11-12 MED FILL — OXYCODONE W/APAP 5/325 TAB: 5-325 | 2 days supply | Qty: 15 | Fill #0

## 2016-11-12 MED FILL — IBUPROFEN 600 MG TABLET: 600 | 8 days supply | Qty: 30 | Fill #0

## 2016-11-12 SURGICAL SUPPLY — 24 items
ABLATOR ENDOMETRIAL BIPOLAR (ABLATOR) IMPLANT
BIPOLAR CUTTING LOOP 21FR (ELECTRODE)
CANISTER SUCT 3000ML PPV (MISCELLANEOUS) ×3 IMPLANT
CATH ROBINSON RED A/P 16FR (CATHETERS) ×2 IMPLANT
CLOTH BEACON ORANGE TIMEOUT ST (SAFETY) ×2 IMPLANT
CONTAINER PREFILL 10% NBF 60ML (FORM) ×4 IMPLANT
DEVICE MYOSURE LITE (MISCELLANEOUS) IMPLANT
DEVICE MYOSURE REACH (MISCELLANEOUS) IMPLANT
ELECT REM PT RETURN 9FT ADLT (ELECTROSURGICAL)
ELECTRODE REM PT RTRN 9FT ADLT (ELECTROSURGICAL) ×1 IMPLANT
FILTER ARTHROSCOPY CONVERTOR (FILTER) ×2 IMPLANT
GLOVE BIO SURGEON STRL SZ 6.5 (GLOVE) ×2 IMPLANT
GLOVE BIOGEL PI IND STRL 7.0 (GLOVE) ×2 IMPLANT
GLOVE BIOGEL PI INDICATOR 7.0 (GLOVE) ×2
GOWN STRL REUS W/TWL LRG LVL3 (GOWN DISPOSABLE) ×4 IMPLANT
LOOP CUTTING BIPOLAR 21FR (ELECTRODE) ×1 IMPLANT
MYOSURE XL FIBROID REM (MISCELLANEOUS) ×2
PACK VAGINAL MINOR WOMEN LF (CUSTOM PROCEDURE TRAY) ×2 IMPLANT
PAD OB MATERNITY 4.3X12.25 (PERSONAL CARE ITEMS) ×2 IMPLANT
SEAL ROD LENS SCOPE MYOSURE (ABLATOR) ×2 IMPLANT
SYSTEM TISS REMOVAL MYSR XL RM (MISCELLANEOUS) IMPLANT
TOWEL OR 17X24 6PK STRL BLUE (TOWEL DISPOSABLE) ×4 IMPLANT
TUBING AQUILEX INFLOW (TUBING) ×2 IMPLANT
TUBING AQUILEX OUTFLOW (TUBING) ×2 IMPLANT

## 2016-11-12 NOTE — Anesthesia Postprocedure Evaluation (Signed)
Anesthesia Post Note  Patient: Amanda Murphy  Procedure(s) Performed: Procedure(s) (LRB): DILATATION & CURETTAGE/HYSTEROSCOPY WITH MYOSURE (N/A)     Patient location during evaluation: PACU Anesthesia Type: General Level of consciousness: sedated and patient cooperative Pain management: pain level controlled Vital Signs Assessment: post-procedure vital signs reviewed and stable Respiratory status: spontaneous breathing Cardiovascular status: stable Anesthetic complications: no    Last Vitals:  Vitals:   11/12/16 1345 11/12/16 1428  BP: 96/69 119/68  Pulse: 91 83  Resp: 12 18  Temp: 36.6 C     Last Pain:  Vitals:   11/12/16 1345  TempSrc:   PainSc: 0-No pain   Pain Goal: Patients Stated Pain Goal: 3 (11/12/16 1345)               Nolon Nations

## 2016-11-12 NOTE — Discharge Instructions (Signed)
FU office 2-3 weeks for postop appointment.  Call the office [phone] for an appointment.  Personal Hygiene: Use pads not tampons x 1week You may shower, no tub baths or pools for 2-3 weeks Wipe from front to back when using restroom  Activity: Do not drive or operate any equipment for 24 hrs.   Do not rest in bed all day Walking is encouraged Walk up and down stairs slowly You may return to your normal activity in 1-2 days  Sexual Activity:  No intercourse for 2 weeks after the procedure.  Diet: Eat a light meal as desired this evening.  You may resume your usual diet tomorrow.  Return to work:  You may resume your work activities after 1-2 days  What to expect:  Expect to have vaginal bleeding/discharge for 2-3 days and spotting for 10-14 days.  It is not unusual to have soreness for 1-2 weeks.  You may have a slight burning sensation when you urinate for the first few days.  You may start your menses in 2-6 weeks.  Mild cramps may continue for a couple of days.    Call your doctor:   Excessive bleeding, saturating a pad every hour Inability to urinate 6 hours after discharge Pain not relieved with pain medications Fever of 100.4 or greater     Post Anesthesia Home Care Instructions  Activity: Get plenty of rest for the remainder of the day. A responsible individual must stay with you for 24 hours following the procedure.  For the next 24 hours, DO NOT: -Drive a car -Paediatric nurse -Drink alcoholic beverages -Take any medication unless instructed by your physician -Make any legal decisions or sign important papers.  Meals: Start with liquid foods such as gelatin or soup. Progress to regular foods as tolerated. Avoid greasy, spicy, heavy foods. If nausea and/or vomiting occur, drink only clear liquids until the nausea and/or vomiting subsides. Call your physician if vomiting continues.  Special Instructions/Symptoms: Your throat may feel dry or sore from the  anesthesia or the breathing tube placed in your throat during surgery. If this causes discomfort, gargle with warm salt water. The discomfort should disappear within 24 hours.  If you had a scopolamine patch placed behind your ear for the management of post- operative nausea and/or vomiting:  1. The medication in the patch is effective for 72 hours, after which it should be removed.  Wrap patch in a tissue and discard in the trash. Wash hands thoroughly with soap and water. 2. You may remove the patch earlier than 72 hours if you experience unpleasant side effects which may include dry mouth, dizziness or visual disturbances. 3. Avoid touching the patch. Wash your hands with soap and water after contact with the patch.      NO IBUPROFEN PRODUCTS (MOTRIN, ADVIL) OR ALEVE UNTIL 7:15pm TODAY.

## 2016-11-12 NOTE — Anesthesia Procedure Notes (Signed)
Procedure Name: LMA Insertion Date/Time: 11/12/2016 12:57 PM Performed by: Talbot Grumbling Pre-anesthesia Checklist: Patient identified, Emergency Drugs available, Suction available and Patient being monitored Patient Re-evaluated:Patient Re-evaluated prior to inductionOxygen Delivery Method: Circle system utilized Preoxygenation: Pre-oxygenation with 100% oxygen Intubation Type: IV induction Ventilation: Mask ventilation without difficulty LMA: LMA inserted LMA Size: 4.0 Number of attempts: 1 Placement Confirmation: positive ETCO2 and breath sounds checked- equal and bilateral Tube secured with: Tape Dental Injury: Teeth and Oropharynx as per pre-operative assessment

## 2016-11-12 NOTE — Op Note (Signed)
NAMEMERRYN, Amanda Murphy NO.:  [id number]  MEDICAL RECORD NO.:  [id number]  LOCATION:                                 FACILITY:  PHYSICIAN:  Marylynn Pearson, MD    DATE OF BIRTH:  01/13/1981  DATE OF PROCEDURE:  11/12/2016 DATE OF DISCHARGE:                              OPERATIVE REPORT   PREOPERATIVE DIAGNOSIS:  Menorrhagia.  POSTOPERATIVE DIAGNOSIS:  Menorrhagia, pathology pending.  PROCEDURE: 1. Paracervical block. 2. Hysteroscopy D and C with resection of endometrial mass, suspect     polyp.  SURGEON:  Marylynn Pearson, MD.  ANESTHESIA:  General.  ESTIMATED BLOOD LOSS:  Less than 100 mL.  COMPLICATIONS:  None.  PATHOLOGY SPECIMEN:  Endometrial polyp with curettings.  DESCRIPTION OF PROCEDURE:  The patient was taken to the operating room after informed consent was obtained.  She was placed in the dorsal lithotomy position using Allen stirrups.  She was given general anesthesia and prepped and draped in sterile fashion.  Bivalve speculum was placed in the vagina and 1% lidocaine was used to perform a paracervical block.  Single-tooth tenaculum was attached to the anterior lip of the cervix.  The cervix was serially dilated using Pratt dilators and the hysteroscope was inserted.  Fluffy endometrial polypoid mass noted.  No evidence of fibroid or other abnormalities.  The MyoSure was inserted through the port of the hysteroscope and polypoid mass was resected to the base.  A gentle curetting was done using the MyoSure throughout the entire endometrial cavity.  Once a clean cavity was identified, the MyoSure and hysteroscope were removed.  The tenaculum was removed from the cervix.  The cervix was hemostatic.  Speculum was removed.  She was taken to the recovery room in stable condition. Sponge, lap, needle, and instrument counts were correct x2.     Marylynn Pearson, MD    GA/MEDQ  D:  11/12/2016  T:  11/12/2016  Job:  [id number]

## 2016-11-12 NOTE — Anesthesia Preprocedure Evaluation (Signed)
Anesthesia Evaluation  Patient identified by MRN, date of birth, ID band Patient awake    Reviewed: Allergy & Precautions, NPO status , Patient's Chart, lab work & pertinent test results  History of Anesthesia Complications Negative for: history of anesthetic complications  Airway Mallampati: II  TM Distance: >3 FB Neck ROM: Full    Dental no notable dental hx. (+) Dental Advisory Given   Pulmonary asthma ,    Pulmonary exam normal breath sounds clear to auscultation       Cardiovascular negative cardio ROS Normal cardiovascular exam Rhythm:Regular Rate:Normal     Neuro/Psych  Headaches, PSYCHIATRIC DISORDERS Anxiety    GI/Hepatic negative GI ROS, Neg liver ROS,   Endo/Other  Morbid obesity  Renal/GU negative Renal ROS  negative genitourinary   Musculoskeletal negative musculoskeletal ROS (+)   Abdominal (+) + obese,   Peds negative pediatric ROS (+)  Hematology negative hematology ROS (+)   Anesthesia Other Findings   Reproductive/Obstetrics negative OB ROS                             Anesthesia Physical  Anesthesia Plan  ASA: III  Anesthesia Plan: General   Post-op Pain Management:    Induction: Intravenous  PONV Risk Score and Plan: 3 and Ondansetron, Dexamethasone, Propofol, Midazolam and Treatment may vary due to age  Airway Management Planned: LMA  Additional Equipment: Arterial line  Intra-op Plan:   Post-operative Plan: Extubation in OR  Informed Consent: I have reviewed the patients History and Physical, chart, labs and discussed the procedure including the risks, benefits and alternatives for the proposed anesthesia with the patient or authorized representative who has indicated his/her understanding and acceptance.   Dental advisory given  Plan Discussed with: CRNA  Anesthesia Plan Comments:         Anesthesia Quick Evaluation

## 2016-11-12 NOTE — Transfer of Care (Signed)
Immediate Anesthesia Transfer of Care Note  Patient: Amanda Murphy  Procedure(s) Performed: Procedure(s): DILATATION & CURETTAGE/HYSTEROSCOPY WITH MYOSURE (N/A)  Patient Location: PACU  Anesthesia Type:General  Level of Consciousness:  sedated, patient cooperative and responds to stimulation  Airway & Oxygen Therapy:Patient Spontanous Breathing and Patient connected to face mask oxgen  Post-op Assessment:  Report given to PACU RN and Post -op Vital signs reviewed and stable  Post vital signs:  Reviewed and stable  Last Vitals:  Vitals:   11/12/16 1149  BP: (!) 149/96  Pulse: 98  Resp: 20  Temp: 81.3 C    Complications: No apparent anesthesia complications

## 2016-11-13 ENCOUNTER — Encounter (HOSPITAL_COMMUNITY): Payer: Self-pay | Admitting: Obstetrics and Gynecology

## 2016-11-24 ENCOUNTER — Ambulatory Visit (INDEPENDENT_AMBULATORY_CARE_PROVIDER_SITE_OTHER): Payer: 59 | Admitting: Family Medicine

## 2016-11-24 VITALS — BP 134/81 | HR 113 | Temp 98.4°F | Ht 63.0 in | Wt 316.0 lb

## 2016-11-24 DIAGNOSIS — R7303 Prediabetes: Secondary | ICD-10-CM | POA: Diagnosis not present

## 2016-11-24 DIAGNOSIS — E559 Vitamin D deficiency, unspecified: Secondary | ICD-10-CM | POA: Diagnosis not present

## 2016-11-24 MED ORDER — LIRAGLUTIDE -WEIGHT MANAGEMENT 18 MG/3ML ~~LOC~~ SOPN: 3.0000 mg | PEN_INJECTOR | Freq: Every morning | SUBCUTANEOUS | 0 refills | Status: DC

## 2016-11-24 MED ORDER — VITAMIN D (ERGOCALCIFEROL) 1.25 MG (50000 UNIT) PO CAPS: 50000.0000 [IU] | ORAL_CAPSULE | ORAL | 0 refills | Status: DC

## 2016-11-24 MED FILL — VIT D2 1.25 MG (50,000 UNIT: 1.25 MG | 28 days supply | Qty: 4 | Fill #0

## 2016-11-24 NOTE — Progress Notes (Signed)
Office: [phone]  /  Fax: [phone]   HPI:   Chief Complaint: OBESITY Amanda Murphy is here to discuss her progress with her obesity treatment plan. She is on the  keep a food journal with 1500 to 1700 calories and 90 grams of protein  and is following her eating plan approximately 25 % of the time. She states she is exercising 0 minutes 0 times per week. Amanda Murphy has had D and C uterine surgery and has had a hard time planning ahead. She is ready to get back on track. Her weight is (!) 316 lb (143.3 kg) today and has maintained weight over a period of 2 to 3 weeks since her last visit. She has lost 8 lbs since starting treatment with [place].  Vitamin D deficiency Amanda Murphy has a diagnosis of vitamin D deficiency. She is currently taking vit D and denies nausea, vomiting or muscle weakness.  Pre-Diabetes Amanda Murphy has a diagnosis of pre-diabetes based on her elevated Hgb A1c and was informed this puts her at greater risk of developing diabetes. She is currently taking exenatide and continues to work on diet and exercise to decrease risk of diabetes. She denies nausea or hypoglycemia.   ALLERGIES: No Known Allergies  MEDICATIONS: Current Outpatient Prescriptions on File Prior to Visit  Medication Sig Dispense Refill   acetaminophen (TYLENOL) 500 MG tablet Take 1,000 mg by mouth every 6 (six) hours as needed for mild pain.     ALPRAZolam (XANAX) 0.5 MG tablet Take 1 tablet (0.5 mg total) by mouth at bedtime as needed for anxiety. 20 tablet 0   aspirin EC 325 MG tablet Take 325 mg by mouth daily.     buPROPion (WELLBUTRIN SR) 200 MG 12 hr tablet Take 1 tablet (200 mg total) by mouth daily. 30 tablet 0   calcium carbonate (TUMS - DOSED IN MG ELEMENTAL CALCIUM) 500 MG chewable tablet Chew 1 tablet by mouth as needed. Reported on 07/31/2015     docusate sodium (COLACE) 100 MG capsule Take 100 mg by mouth daily as needed for mild constipation.     ibuprofen (ADVIL,MOTRIN) 600 MG tablet  Take 1 tablet (600 mg total) by mouth every 6 (six) hours as needed. 30 tablet 0   levocetirizine (XYZAL) 5 MG tablet Take 1 tablet (5 mg total) by mouth every evening. (Patient taking differently: Take 5 mg by mouth daily as needed. ) 30 tablet 11   oxyCODONE-acetaminophen (PERCOCET/ROXICET) 5-325 MG tablet Take 1-2 tablets by mouth every 4 (four) hours as needed for severe pain. 15 tablet 0   Prenatal Vit-Fe Fumarate-FA (PRENATAL MULTIVITAMIN) TABS tablet Take 1 tablet by mouth daily.      No current facility-administered medications on file prior to visit.     PAST MEDICAL HISTORY: Past Medical History:  Diagnosis Date   Abnormal uterine bleeding (AUB)    Anemia    Anxiety    Asthma    as child - no inhaler - no problem as adult   Astigmatism    Fibroids    H/O diabetes insipidus    s/p craniotomy   Headache(784.0)    hx - none since surgery   Hx of blood clots 2016   P. E.  p craniotomy. on Xarelto until 2017.   Joint pain    ankles and hips   PCOS (polycystic ovarian syndrome)    Pneumonia    Hx 3-4 yrs ago    Pre-diabetes    Seasonal allergies    Vision abnormalities  Vitamin D deficiency    Wears glasses     PAST SURGICAL HISTORY: Past Surgical History:  Procedure Laterality Date   CRANIOTOMY N/A 10/31/2014   Procedure: CRANIOTOMY FOR RESECTION OF TUMOR;  Surgeon: Jovita Gamma, MD;  Location: [facility];  Service: Neurosurgery;  Laterality: N/A;  Craniotomy for resection of tumor   DILATATION & CURETTAGE/HYSTEROSCOPY WITH MYOSURE N/A 11/12/2016   Procedure: [place];  Surgeon: Marylynn Pearson, MD;  Location: [facility];  Service: Gynecology;  Laterality: N/A;   DILATION AND CURETTAGE OF UTERUS     myomectomy   MYOMECTOMY     vaginal -    WISDOM TOOTH EXTRACTION  2006    SOCIAL HISTORY: Social History  Substance Use Topics   Smoking status: Never Smoker   Smokeless tobacco: Never Used    Alcohol use 0.0 oz/week     Comment: occasional wine    FAMILY HISTORY: Family History  Problem Relation Age of Onset   Hypertension Mother    Diabetes Mother    Obesity Mother    Ovarian cancer Unknown    Breast cancer Unknown    Fibroids Unknown     ROS: Review of Systems  Constitutional: Negative for weight loss.  Gastrointestinal: Negative for nausea and vomiting.  Musculoskeletal:       Negative muscle weakness  Endo/Heme/Allergies:       Negative polyphagia Negative hypoglycemia    PHYSICAL EXAM: Blood pressure 134/81, pulse (!) 113, temperature 98.4 F (36.9 C), temperature source Oral, height 5\' 3"  (1.6 m), weight (!) 316 lb (143.3 kg), last menstrual period 10/31/2016, SpO2 96 %. Body mass index is 55.98 kg/m. Physical Exam  Constitutional: She is oriented to person, place, and time. She appears well-developed and well-nourished.  Cardiovascular: Normal rate.   Pulmonary/Chest: Effort normal.  Musculoskeletal: Normal range of motion.  Neurological: She is oriented to person, place, and time.  Skin: Skin is warm and dry.  Psychiatric: She has a normal mood and affect. Her behavior is normal.  Vitals reviewed.   RECENT LABS AND TESTS: BMET    Component Value Date/Time   NA 136 11/06/2016 1355   NA 144 09/01/2016 1231   K 4.1 11/06/2016 1355   CL 102 11/06/2016 1355   CO2 26 11/06/2016 1355   GLUCOSE 92 11/06/2016 1355   BUN 10 11/06/2016 1355   BUN 9 09/01/2016 1231   CREATININE 0.92 11/06/2016 1355   CALCIUM 9.1 11/06/2016 1355   GFRNONAA >60 11/06/2016 1355   GFRAA >60 11/06/2016 1355   Lab Results  Component Value Date   HGBA1C 5.6 09/01/2016   HGBA1C 5.8 (H) 05/19/2016   HGBA1C 6.1 09/27/2015   HGBA1C 5.8 09/19/2014   Lab Results  Component Value Date   INSULIN 80.9 (H) 09/01/2016   CBC    Component Value Date/Time   WBC 10.9 (H) 11/06/2016 1355   RBC 3.95 11/06/2016 1355   HGB 10.2 (L) 11/06/2016 1355   HGB 13.5 05/19/2016  1053   HCT 32.7 (L) 11/06/2016 1355   HCT 41.6 05/19/2016 1053   PLT 489 (H) 11/06/2016 1355   PLT 352 05/19/2016 1053   MCV 82.8 11/06/2016 1355   MCV 92 05/19/2016 1053   MCH 25.8 (L) 11/06/2016 1355   MCHC 31.2 11/06/2016 1355   RDW 16.2 (H) 11/06/2016 1355   RDW 16.1 (H) 05/19/2016 1053   LYMPHSABS 3.1 05/19/2016 1053   MONOABS 0.4 11/23/2014 1339   EOSABS 0.2 05/19/2016 1053  BASOSABS 0.0 05/19/2016 1053   Iron/TIBC/Ferritin/ %Sat    Component Value Date/Time   IRON 63 05/19/2016 1105   TIBC 343 05/19/2016 1105   FERRITIN 34 05/19/2016 1105   IRONPCTSAT 18 05/19/2016 1105   Lipid Panel     Component Value Date/Time   CHOL 172 09/01/2016 1231   TRIG 85 09/01/2016 1231   HDL 67 09/01/2016 1231   CHOLHDL 3 09/27/2015 1010   VLDL 21.6 09/27/2015 1010   LDLCALC 88 09/01/2016 1231   Hepatic Function Panel     Component Value Date/Time   PROT 7.2 11/06/2016 1355   PROT 7.1 09/01/2016 1231   ALBUMIN 3.1 (L) 11/06/2016 1355   ALBUMIN 3.9 09/01/2016 1231   AST 20 11/06/2016 1355   ALT 24 11/06/2016 1355   ALKPHOS 81 11/06/2016 1355   BILITOT 0.2 (L) 11/06/2016 1355   BILITOT 0.2 09/01/2016 1231      Component Value Date/Time   TSH 1.420 05/19/2016 1053   TSH 0.239 (L) 11/26/2014 1325    ASSESSMENT AND PLAN: Vitamin D deficiency - Plan: Vitamin D, Ergocalciferol, (DRISDOL) 50000 units CAPS capsule  Prediabetes  Morbid obesity ([place]) - Plan: Liraglutide -Weight Management (SAXENDA) 18 MG/3ML SOPN  PLAN:  Vitamin D Deficiency Amanda Murphy was informed that low vitamin D levels contributes to fatigue and are associated with obesity, breast, and colon cancer. She agrees to continue to take prescription Vit D @50 ,000 IU every week and will follow up for routine testing of vitamin D, at least 2-3 times per year. She was informed of the risk of over-replacement of vitamin D and agrees to not increase her dose unless he discusses this with [place]  first.  Amanda Murphy will continue to work on weight loss, exercise, and decreasing simple carbohydrates in her diet to help decrease the risk of diabetes. We dicussed metformin including benefits and risks. She was informed that eating too many simple carbohydrates or too many calories at one sitting increases the likelihood of GI side effects. Amanda Murphy will stop exenatide and she agreed to follow up with [place] as directed to monitor her progress.  Obesity Amanda Murphy is currently in the action stage of change. As such, her goal is to continue with weight loss efforts She has agreed to keep a food journal with 1500 to 1700 calories and 90+ grams of protein  Amanda Murphy has been instructed to work up to a goal of 150 minutes of combined cardio and strengthening exercise per week for weight loss and overall health benefits. We discussed the following Behavioral Modification Strategies today: increasing lean protein intake and meal planning & cooking strategies We discussed various medication options to help Amanda Murphy with her weight loss efforts and we both agreed to start Saxenda 0.6 ml once in morning (was on phentermine in the past) and will follow up with our clinic in 2 to 3 weeks.  Amanda Murphy has agreed to follow up with our clinic in 2 to 3 weeks. She was informed of the importance of frequent follow up visits to maximize her success with intensive lifestyle modifications for her multiple health conditions.  I, Doreene Nest, am acting as transcriptionist for Dennard Nip, MD  I have reviewed the above documentation for accuracy and completeness, and I agree with the above. -Dennard Nip, MD  OBESITY BEHAVIORAL INTERVENTION VISIT  Today's visit was # 13 out of 22.  Starting weight: 324 lbs Starting date: 05/19/16 Today's weight : 316 lbs  Today's date: 11/24/2016 Total lbs lost to date: 8 (  Patients must lose 7 lbs in the first 6 months to continue with counseling)   ASK: We  discussed the diagnosis of obesity with Amanda Murphy today and Amanda Murphy agreed to give [place] permission to discuss obesity behavioral modification therapy today.  ASSESS: Amanda Murphy has the diagnosis of obesity and her BMI today is @TBMI @ Amanda Murphy is in the action stage of change   ADVISE: Amanda Murphy was educated on the multiple health risks of obesity as well as the benefit of weight loss to improve her health. She was advised of the need for long term treatment and the importance of lifestyle modifications.  AGREE: Multiple dietary modification options and treatment options were discussed and  Amanda Murphy agreed to keep a food journal with 1500 to 1700 calories and 90+ grams of protein  We discussed the following Behavioral Modification Strategies today: increasing lean protein intake and meal planning & cooking strategies

## 2016-11-27 MED FILL — SAXENDA 18 MG/3 ML PEN: 18 | 30 days supply | Qty: 15 | Fill #0

## 2016-12-01 ENCOUNTER — Telehealth (INDEPENDENT_AMBULATORY_CARE_PROVIDER_SITE_OTHER): Payer: Self-pay | Admitting: Family Medicine

## 2016-12-01 ENCOUNTER — Encounter (INDEPENDENT_AMBULATORY_CARE_PROVIDER_SITE_OTHER): Payer: Self-pay | Admitting: Family Medicine

## 2016-12-01 NOTE — Telephone Encounter (Signed)
Yes mam,  I changed it and faxed it yesterday.    Thanks

## 2016-12-01 NOTE — Telephone Encounter (Signed)
saxenda needs quantity changed , contact pharmacy

## 2016-12-03 MED FILL — UNIFINE PENTIPS 31GX3/16: 31G X 5 MM | 90 days supply | Qty: 100 | Fill #0

## 2016-12-03 MED FILL — UNIFINE PENTIPS 31GX3/16": 31G X 5 MM | 90 days supply | Qty: 100 | Fill #0

## 2016-12-15 ENCOUNTER — Ambulatory Visit (INDEPENDENT_AMBULATORY_CARE_PROVIDER_SITE_OTHER): Payer: 59 | Admitting: Family Medicine

## 2016-12-15 VITALS — BP 119/74 | HR 94 | Temp 98.6°F | Ht 64.0 in | Wt 318.0 lb

## 2016-12-15 DIAGNOSIS — Z6841 Body Mass Index (BMI) 40.0 and over, adult: Secondary | ICD-10-CM

## 2016-12-15 DIAGNOSIS — IMO0001 Reserved for inherently not codable concepts without codable children: Secondary | ICD-10-CM | POA: Insufficient documentation

## 2016-12-15 DIAGNOSIS — E559 Vitamin D deficiency, unspecified: Secondary | ICD-10-CM | POA: Diagnosis not present

## 2016-12-15 DIAGNOSIS — E669 Obesity, unspecified: Secondary | ICD-10-CM

## 2016-12-15 NOTE — Progress Notes (Signed)
Office: [phone]  /  Fax: [phone]   HPI:   Chief Complaint: OBESITY Amanda Murphy is here to discuss her progress with her obesity treatment plan. She is on the  keep a food journal with 1500 to 1700 calories and 90+ grams of protein  and is following her eating plan approximately 90 % of the time. She states she is walking/water aerobics 4 times per week. Orrie has increased eating out and increased celebration eating in the last 2 weeks. She is journaling on and off and is working on increasing lean protein. She is exercising and walking approximately 8,000 steps per day. Chyan is on Saxenda for 1 week at 0.6 mg and notes mildly decreased hunger. Her weight is (!) 318 lb (144.2 kg) today and has had a weight gain of 2 pounds over a period of 3 weeks since her last visit. She has lost 6 lbs since starting treatment with [place].  Vitamin D deficiency Melana has a diagnosis of vitamin D deficiency. She is currently stable on vit D, not yet at goal. She still notes fatigue and denies nausea, vomiting or muscle weakness.   ALLERGIES: No Known Allergies  MEDICATIONS: Current Outpatient Prescriptions on File Prior to Visit  Medication Sig Dispense Refill  . acetaminophen (TYLENOL) 500 MG tablet Take 1,000 mg by mouth every 6 (six) hours as needed for mild pain.    [name] ALPRAZolam (XANAX) 0.5 MG tablet Take 1 tablet (0.5 mg total) by mouth at bedtime as needed for anxiety. 20 tablet 0  . aspirin EC 325 MG tablet Take 325 mg by mouth daily.    [name] buPROPion (WELLBUTRIN SR) 200 MG 12 hr tablet Take 1 tablet (200 mg total) by mouth daily. 30 tablet 0  . calcium carbonate (TUMS - DOSED IN MG ELEMENTAL CALCIUM) 500 MG chewable tablet Chew 1 tablet by mouth as needed. Reported on 07/31/2015    . docusate sodium (COLACE) 100 MG capsule Take 100 mg by mouth daily as needed for mild constipation.    [name] ibuprofen (ADVIL,MOTRIN) 600 MG tablet Take 1 tablet (600 mg total) by mouth every 6 (six) hours as  needed. 30 tablet 0  . levocetirizine (XYZAL) 5 MG tablet Take 1 tablet (5 mg total) by mouth every evening. (Patient taking differently: Take 5 mg by mouth daily as needed. ) 30 tablet 11  . Liraglutide -Weight Management (SAXENDA) 18 MG/3ML SOPN Inject 3 mg into the skin every morning. (Patient taking differently: Inject 3 mg into the skin every morning. ) 3 pen 0  . oxyCODONE-acetaminophen (PERCOCET/ROXICET) 5-325 MG tablet Take 1-2 tablets by mouth every 4 (four) hours as needed for severe pain. 15 tablet 0  . Prenatal Vit-Fe Fumarate-FA (PRENATAL MULTIVITAMIN) TABS tablet Take 1 tablet by mouth daily.     . Vitamin D, Ergocalciferol, (DRISDOL) 50000 units CAPS capsule Take 1 capsule (50,000 Units total) by mouth every 7 (seven) days. 4 capsule 0   No current facility-administered medications on file prior to visit.     PAST MEDICAL HISTORY: Past Medical History:  Diagnosis Date  . Abnormal uterine bleeding (AUB)   . Anemia   . Anxiety   . Asthma    as child - no inhaler - no problem as adult  . Astigmatism   . Fibroids   . H/O diabetes insipidus    s/p craniotomy  . Headache(784.0)    hx - none since surgery  . Hx of blood clots 2016   P. E.  p  craniotomy. on Xarelto until 2017.  . Joint pain    ankles and hips  . PCOS (polycystic ovarian syndrome)   . Pneumonia    Hx 3-4 yrs ago   . Pre-diabetes   . Seasonal allergies   . Vision abnormalities   . Vitamin D deficiency   . Wears glasses     PAST SURGICAL HISTORY: Past Surgical History:  Procedure Laterality Date  . CRANIOTOMY N/A 10/31/2014   Procedure: CRANIOTOMY FOR RESECTION OF TUMOR;  Surgeon: Jovita Gamma, MD;  Location: [facility];  Service: Neurosurgery;  Laterality: N/A;  Craniotomy for resection of tumor  . DILATATION & CURETTAGE/HYSTEROSCOPY WITH MYOSURE N/A 11/12/2016   Procedure: DILATATION & CURETTAGE/HYSTEROSCOPY WITH MYOSURE;  Surgeon: Marylynn Pearson, MD;  Location: [facility];  Service: Gynecology;   Laterality: N/A;  . DILATION AND CURETTAGE OF UTERUS     myomectomy  . MYOMECTOMY     vaginal -   . WISDOM TOOTH EXTRACTION  2006    SOCIAL HISTORY: Social History  Substance Use Topics  . Smoking status: Never Smoker  . Smokeless tobacco: Never Used  . Alcohol use 0.0 oz/week     Comment: occasional wine    FAMILY HISTORY: Family History  Problem Relation Age of Onset  . Hypertension Mother   . Diabetes Mother   . Obesity Mother   . Ovarian cancer Unknown   . Breast cancer Unknown   . Fibroids Unknown     ROS: Review of Systems  Constitutional: Positive for malaise/fatigue. Negative for weight loss.  Gastrointestinal: Negative for nausea and vomiting.  Musculoskeletal:       Negative muscle weakness    PHYSICAL EXAM: Blood pressure 119/74, pulse 94, temperature 98.6 F (37 C), temperature source Oral, height 5\' 4"  (1.626 m), weight (!) 318 lb (144.2 kg), SpO2 98 %. Body mass index is 54.58 kg/m. Physical Exam  Constitutional: She is oriented to person, place, and time. She appears well-developed and well-nourished.  Cardiovascular: Normal rate.   Musculoskeletal: Normal range of motion.  Neurological: She is oriented to person, place, and time.  Skin: Skin is warm and dry.  Psychiatric: She has a normal mood and affect. Her behavior is normal.  Vitals reviewed.   RECENT LABS AND TESTS: BMET    Component Value Date/Time   NA 136 11/06/2016 1355   NA 144 09/01/2016 1231   K 4.1 11/06/2016 1355   CL 102 11/06/2016 1355   CO2 26 11/06/2016 1355   GLUCOSE 92 11/06/2016 1355   BUN 10 11/06/2016 1355   BUN 9 09/01/2016 1231   CREATININE 0.92 11/06/2016 1355   CALCIUM 9.1 11/06/2016 1355   GFRNONAA >60 11/06/2016 1355   GFRAA >60 11/06/2016 1355   Lab Results  Component Value Date   HGBA1C 5.6 09/01/2016   HGBA1C 5.8 (H) 05/19/2016   HGBA1C 6.1 09/27/2015   HGBA1C 5.8 09/19/2014   Lab Results  Component Value Date   INSULIN 80.9 (H) 09/01/2016    CBC    Component Value Date/Time   WBC 10.9 (H) 11/06/2016 1355   RBC 3.95 11/06/2016 1355   HGB 10.2 (L) 11/06/2016 1355   HGB 13.5 05/19/2016 1053   HCT 32.7 (L) 11/06/2016 1355   HCT 41.6 05/19/2016 1053   PLT 489 (H) 11/06/2016 1355   PLT 352 05/19/2016 1053   MCV 82.8 11/06/2016 1355   MCV 92 05/19/2016 1053   MCH 25.8 (L) 11/06/2016 1355   MCHC 31.2 11/06/2016 1355   RDW  16.2 (H) 11/06/2016 1355   RDW 16.1 (H) 05/19/2016 1053   LYMPHSABS 3.1 05/19/2016 1053   MONOABS 0.4 11/23/2014 1339   EOSABS 0.2 05/19/2016 1053   BASOSABS 0.0 05/19/2016 1053   Iron/TIBC/Ferritin/ %Sat    Component Value Date/Time   IRON 63 05/19/2016 1105   TIBC 343 05/19/2016 1105   FERRITIN 34 05/19/2016 1105   IRONPCTSAT 18 05/19/2016 1105   Lipid Panel     Component Value Date/Time   CHOL 172 09/01/2016 1231   TRIG 85 09/01/2016 1231   HDL 67 09/01/2016 1231   CHOLHDL 3 09/27/2015 1010   VLDL 21.6 09/27/2015 1010   LDLCALC 88 09/01/2016 1231   Hepatic Function Panel     Component Value Date/Time   PROT 7.2 11/06/2016 1355   PROT 7.1 09/01/2016 1231   ALBUMIN 3.1 (L) 11/06/2016 1355   ALBUMIN 3.9 09/01/2016 1231   AST 20 11/06/2016 1355   ALT 24 11/06/2016 1355   ALKPHOS 81 11/06/2016 1355   BILITOT 0.2 (L) 11/06/2016 1355   BILITOT 0.2 09/01/2016 1231      Component Value Date/Time   TSH 1.420 05/19/2016 1053   TSH 0.239 (L) 11/26/2014 1325    ASSESSMENT AND PLAN: Vitamin D deficiency  Class 3 obesity with serious comorbidity and body mass index (BMI) of 50.0 to 59.9 in adult, unspecified obesity type (HCC)  PLAN:  Vitamin D Deficiency Aubryn was informed that low vitamin D levels contributes to fatigue and are associated with obesity, breast, and colon cancer. She agrees to continue to take prescription Vit D @50 ,000 IU every week and we will re-check labs in 1 month and will follow up for routine testing of vitamin D, at least 2-3 times per year. She was  informed of the risk of over-replacement of vitamin D and agrees to not increase her dose unless he discusses this with [place] first. Liticia agrees to follow up with our clinic in 2 weeks.  Obesity Lougenia is currently in the action stage of change. As such, her goal is to continue with weight loss efforts She has agreed to keep a food journal with 1500 to 1700 calories and 90 grams of protein  Aleyna has been instructed to work up to a goal of 150 minutes of combined cardio and strengthening exercise per week for weight loss and overall health benefits. We discussed the following Behavioral Modification Strategies today: keep a strict food journal, increasing lean protein intake and meal planning & cooking strategies Lummie can increase Saxenda to 1.2 mg Melanee has agreed to follow up with our clinic in 2 weeks. She was informed of the importance of frequent follow up visits to maximize her success with intensive lifestyle modifications for her multiple health conditions.  I, Doreene Nest, am acting as transcriptionist for Dennard Nip, MD  I have reviewed the above documentation for accuracy and completeness, and I agree with the above. -Dennard Nip, MD  OBESITY BEHAVIORAL INTERVENTION VISIT  Today's visit was # 14 out of 22.  Starting weight: 324 lbs Starting date: 05/19/16 Today's weight : 318 lbs Today's date: 12/15/2016 Total lbs lost to date: 6 (Patients must lose 7 lbs in the first 6 months to continue with counseling)   ASK: We discussed the diagnosis of obesity with Roxanne Mins today and Olin Hauser agreed to give [place] permission to discuss obesity behavioral modification therapy today.  ASSESS: Sissi has the diagnosis of obesity and her BMI today is 54.7 Lesly is in the action  stage of change   ADVISE: Sophia was educated on the multiple health risks of obesity as well as the benefit of weight loss to improve her health. She was advised of the need for  long term treatment and the importance of lifestyle modifications.  AGREE: Multiple dietary modification options and treatment options were discussed and  Marlisa agreed to keep a food journal with 1500 to 1700 calories and 90 grams of protein  We discussed the following Behavioral Modification Strategies today: keep a strict food journal, increasing lean protein intake and meal planning & cooking strategies

## 2016-12-24 DIAGNOSIS — Z30430 Encounter for insertion of intrauterine contraceptive device: Secondary | ICD-10-CM | POA: Diagnosis not present

## 2016-12-29 ENCOUNTER — Ambulatory Visit (INDEPENDENT_AMBULATORY_CARE_PROVIDER_SITE_OTHER): Payer: 59 | Admitting: Family Medicine

## 2016-12-29 VITALS — BP 124/79 | HR 92 | Temp 98.1°F | Ht 64.0 in | Wt 314.0 lb

## 2016-12-29 DIAGNOSIS — F3289 Other specified depressive episodes: Secondary | ICD-10-CM

## 2016-12-29 DIAGNOSIS — Z6841 Body Mass Index (BMI) 40.0 and over, adult: Secondary | ICD-10-CM

## 2016-12-29 DIAGNOSIS — E559 Vitamin D deficiency, unspecified: Secondary | ICD-10-CM

## 2016-12-29 DIAGNOSIS — IMO0001 Reserved for inherently not codable concepts without codable children: Secondary | ICD-10-CM

## 2016-12-29 DIAGNOSIS — Z9189 Other specified personal risk factors, not elsewhere classified: Secondary | ICD-10-CM

## 2016-12-29 DIAGNOSIS — E669 Obesity, unspecified: Secondary | ICD-10-CM

## 2016-12-29 MED ORDER — VITAMIN D (ERGOCALCIFEROL) 1.25 MG (50000 UNIT) PO CAPS: 50000.0000 [IU] | ORAL_CAPSULE | ORAL | 0 refills | Status: DC

## 2016-12-30 NOTE — Progress Notes (Signed)
Office: [phone]  /  Fax: [phone]   HPI:   Chief Complaint: OBESITY Amanda Murphy is here to discuss her progress with her obesity treatment plan. She is on the  follow the Category 3 plan and is following her eating plan approximately 50 % of the time. She states she does water aerobics and weight lifting exercises for 60 minutes 4 times per week. Gloria continues to do well with weight loss. She plans ahead well, portions controls her meals, and makes smarter food choices. She is currently on Saxenda 0.6mg  and is tolerating it well with no nausea or vomiting. Her weight is (!) 314 lb (142.4 kg) today and has had a weight loss of 4 pounds over a period of 2 weeks since her last visit. She has lost 10 lbs since starting treatment with [place].  Vitamin D deficiency Amanda Murphy has a diagnosis of vitamin D deficiency. She is currently taking vit D and denies nausea, vomiting or muscle weakness.  Depression with emotional eating behaviors Amanda Murphy is struggling with emotional eating and using food for comfort to the extent that it is negatively impacting her health. She often snacks when she is not hungry. Amanda Murphy sometimes feels she is out of control and then feels guilty that she made poor food choices. She has been working on behavior modification techniques to help reduce her emotional eating and has been somewhat successful. She shows no sign of suicidal or homicidal ideations.  Depression screen PHQ 2/9 05/19/2016  Decreased Interest 2  Down, Depressed, Hopeless 1  PHQ - 2 Score 3  Altered sleeping 1  Tired, decreased energy 3  Change in appetite 3  Feeling bad or failure about yourself  2  Trouble concentrating 1  Moving slowly or fidgety/restless 2  Suicidal thoughts 0  PHQ-9 Score 15    At risk for Osteoprosis Amanda Murphy is at higher risk of osteopenia and osteoporosis due to vitamin D deficiency.     ALLERGIES: No Known Allergies  MEDICATIONS: Current Outpatient  Prescriptions on File Prior to Visit  Medication Sig Dispense Refill  . acetaminophen (TYLENOL) 500 MG tablet Take 1,000 mg by mouth every 6 (six) hours as needed for mild pain.    [name] ALPRAZolam (XANAX) 0.5 MG tablet Take 1 tablet (0.5 mg total) by mouth at bedtime as needed for anxiety. 20 tablet 0  . aspirin EC 325 MG tablet Take 325 mg by mouth daily.    [name] buPROPion (WELLBUTRIN SR) 200 MG 12 hr tablet Take 1 tablet (200 mg total) by mouth daily. 30 tablet 0  . calcium carbonate (TUMS - DOSED IN MG ELEMENTAL CALCIUM) 500 MG chewable tablet Chew 1 tablet by mouth as needed. Reported on 07/31/2015    . docusate sodium (COLACE) 100 MG capsule Take 100 mg by mouth daily as needed for mild constipation.    [name] ibuprofen (ADVIL,MOTRIN) 600 MG tablet Take 1 tablet (600 mg total) by mouth every 6 (six) hours as needed. 30 tablet 0  . levocetirizine (XYZAL) 5 MG tablet Take 1 tablet (5 mg total) by mouth every evening. (Patient taking differently: Take 5 mg by mouth daily as needed. ) 30 tablet 11  . Liraglutide -Weight Management (SAXENDA) 18 MG/3ML SOPN Inject 3 mg into the skin every morning. (Patient taking differently: Inject 3 mg into the skin every morning. ) 3 pen 0  . oxyCODONE-acetaminophen (PERCOCET/ROXICET) 5-325 MG tablet Take 1-2 tablets by mouth every 4 (four) hours as needed for severe pain. 15 tablet 0  .  Prenatal Vit-Fe Fumarate-FA (PRENATAL MULTIVITAMIN) TABS tablet Take 1 tablet by mouth daily.      No current facility-administered medications on file prior to visit.     PAST MEDICAL HISTORY: Past Medical History:  Diagnosis Date  . Abnormal uterine bleeding (AUB)   . Anemia   . Anxiety   . Asthma    as child - no inhaler - no problem as adult  . Astigmatism   . Fibroids   . H/O diabetes insipidus    s/p craniotomy  . Headache(784.0)    hx - none since surgery  . Hx of blood clots 2016   P. E.  p craniotomy. on Xarelto until 2017.  . Joint pain    ankles and hips  . PCOS  (polycystic ovarian syndrome)   . Pneumonia    Hx 3-4 yrs ago   . Pre-diabetes   . Seasonal allergies   . Vision abnormalities   . Vitamin D deficiency   . Wears glasses     PAST SURGICAL HISTORY: Past Surgical History:  Procedure Laterality Date  . CRANIOTOMY N/A 10/31/2014   Procedure: CRANIOTOMY FOR RESECTION OF TUMOR;  Surgeon: Jovita Gamma, MD;  Location: [facility];  Service: Neurosurgery;  Laterality: N/A;  Craniotomy for resection of tumor  . DILATATION & CURETTAGE/HYSTEROSCOPY WITH MYOSURE N/A 11/12/2016   Procedure: DILATATION & CURETTAGE/HYSTEROSCOPY WITH MYOSURE;  Surgeon: Marylynn Pearson, MD;  Location: [facility];  Service: Gynecology;  Laterality: N/A;  . DILATION AND CURETTAGE OF UTERUS     myomectomy  . MYOMECTOMY     vaginal -   . WISDOM TOOTH EXTRACTION  2006    SOCIAL HISTORY: Social History  Substance Use Topics  . Smoking status: Never Smoker  . Smokeless tobacco: Never Used  . Alcohol use 0.0 oz/week     Comment: occasional wine    FAMILY HISTORY: Family History  Problem Relation Age of Onset  . Hypertension Mother   . Diabetes Mother   . Obesity Mother   . Ovarian cancer Unknown   . Breast cancer Unknown   . Fibroids Unknown     ROS: Review of Systems  Constitutional: Positive for weight loss.  Gastrointestinal: Negative for nausea and vomiting.  Musculoskeletal:       Negative muscle weakness  Psychiatric/Behavioral: Positive for depression. Negative for suicidal ideas.    PHYSICAL EXAM: Blood pressure 124/79, pulse 92, temperature 98.1 F (36.7 C), temperature source Oral, height 5\' 4"  (1.626 m), weight (!) 314 lb (142.4 kg), SpO2 98 %. Body mass index is 53.9 kg/m. Physical Exam  Constitutional: She is oriented to person, place, and time. She appears well-developed and well-nourished.  Cardiovascular: Normal rate.   Pulmonary/Chest: Effort normal.  Musculoskeletal: Normal range of motion.  Neurological: She is alert and oriented  to person, place, and time.  Skin: Skin is warm and dry.  Psychiatric: She has a normal mood and affect.    RECENT LABS AND TESTS: BMET    Component Value Date/Time   NA 136 11/06/2016 1355   NA 144 09/01/2016 1231   K 4.1 11/06/2016 1355   CL 102 11/06/2016 1355   CO2 26 11/06/2016 1355   GLUCOSE 92 11/06/2016 1355   BUN 10 11/06/2016 1355   BUN 9 09/01/2016 1231   CREATININE 0.92 11/06/2016 1355   CALCIUM 9.1 11/06/2016 1355   GFRNONAA >60 11/06/2016 1355   GFRAA >60 11/06/2016 1355   Lab Results  Component Value Date   HGBA1C 5.6 09/01/2016  HGBA1C 5.8 (H) 05/19/2016   HGBA1C 6.1 09/27/2015   HGBA1C 5.8 09/19/2014   Lab Results  Component Value Date   INSULIN 80.9 (H) 09/01/2016   CBC    Component Value Date/Time   WBC 10.9 (H) 11/06/2016 1355   RBC 3.95 11/06/2016 1355   HGB 10.2 (L) 11/06/2016 1355   HGB 13.5 05/19/2016 1053   HCT 32.7 (L) 11/06/2016 1355   HCT 41.6 05/19/2016 1053   PLT 489 (H) 11/06/2016 1355   PLT 352 05/19/2016 1053   MCV 82.8 11/06/2016 1355   MCV 92 05/19/2016 1053   MCH 25.8 (L) 11/06/2016 1355   MCHC 31.2 11/06/2016 1355   RDW 16.2 (H) 11/06/2016 1355   RDW 16.1 (H) 05/19/2016 1053   LYMPHSABS 3.1 05/19/2016 1053   MONOABS 0.4 11/23/2014 1339   EOSABS 0.2 05/19/2016 1053   BASOSABS 0.0 05/19/2016 1053   Iron/TIBC/Ferritin/ %Sat    Component Value Date/Time   IRON 63 05/19/2016 1105   TIBC 343 05/19/2016 1105   FERRITIN 34 05/19/2016 1105   IRONPCTSAT 18 05/19/2016 1105   Lipid Panel     Component Value Date/Time   CHOL 172 09/01/2016 1231   TRIG 85 09/01/2016 1231   HDL 67 09/01/2016 1231   CHOLHDL 3 09/27/2015 1010   VLDL 21.6 09/27/2015 1010   LDLCALC 88 09/01/2016 1231   Hepatic Function Panel     Component Value Date/Time   PROT 7.2 11/06/2016 1355   PROT 7.1 09/01/2016 1231   ALBUMIN 3.1 (L) 11/06/2016 1355   ALBUMIN 3.9 09/01/2016 1231   AST 20 11/06/2016 1355   ALT 24 11/06/2016 1355   ALKPHOS 81  11/06/2016 1355   BILITOT 0.2 (L) 11/06/2016 1355   BILITOT 0.2 09/01/2016 1231      Component Value Date/Time   TSH 1.420 05/19/2016 1053   TSH 0.239 (L) 11/26/2014 1325    ASSESSMENT AND PLAN: Vitamin D deficiency - Plan: Vitamin D, Ergocalciferol, (DRISDOL) 50000 units CAPS capsule  Other depression  At risk for osteoporosis  Class 3 obesity with serious comorbidity and body mass index (BMI) of 50.0 to 59.9 in adult, unspecified obesity type ([facility])  PLAN:  Vitamin D Deficiency Amanda Murphy was informed that low vitamin D levels contributes to fatigue and are associated with obesity, breast, and colon cancer. She agrees to continue to take prescription Vit D @50 ,000 IU every week, a refill was written today,  and will follow up for routine testing of vitamin D, at least 2-3 times per year. She was informed of the risk of over-replacement of vitamin D and agrees to not increase her dose unless he discusses this with [place] first.  Depression with Emotional Eating Behaviors We discussed behavior modification techniques today to help Amanda Murphy deal with her emotional eating and depression. She has agreed to continue to take Wellbutrin SR 200 mg qd and agreed to follow up as directed.  At risk for osteoprosis Amanda Murphy is at risk for osteopenia and osteoporsis due to her vitamin D deficiency. She was encouraged to take her vitamin D and follow her higher calcium diet and increase strengthening exercise to help strengthen her bones and decrease her risk of osteopenia and osteoporosis.  Obesity Amanda Murphy is currently in the action stage of change. As such, her goal is to continue with weight loss efforts She has agreed to follow the Category 3 plan Amanda Murphy has been instructed to work up to a goal of 150 minutes of combined cardio and strengthening exercise per  week for weight loss and overall health benefits. We discussed the following Behavioral Modification Stratagies today: increasing lean  protein intake and work on meal planning and easy cooking plans. We discussed various medication options to help Glenora with her weight loss efforts and we both agreed to increase Saxenda to 0.9 mg once daily.     Amanda Murphy has agreed to follow up with our clinic in 2 weeks. She was informed of the importance of frequent follow up visits to maximize her success with intensive lifestyle modifications for her multiple health conditions.  Office: [phone]  /  Fax: [phone]  OBESITY BEHAVIORAL INTERVENTION VISIT  Today's visit was # 15 out of 22.  Starting weight: 324  Starting date: 05/19/2016  Today's weight : Weight: (!) 314 lb (142.4 kg)  Today's date: 12/30/2016 Total lbs lost to date: 10 (Patients must lose 7 lbs in the first 6 months to continue with counseling)   ASK: We discussed the diagnosis of obesity with Amanda Murphy today and Amanda Murphy agreed to give [place] permission to discuss obesity behavioral modification therapy today.  ASSESS: Amanda Murphy has the diagnosis of obesity and her BMI today is 74 Amanda Murphy is in the action stage of change   ADVISE: Amanda Murphy was educated on the multiple health risks of obesity as well as the benefit of weight loss to improve her health. She was advised of the need for long term treatment and the importance of lifestyle modifications.  AGREE: Multiple dietary modification options and treatment options were discussed and  Amanda Murphy agreed to follow the Category 3 plan We discussed the following Behavioral Modification Stratagies today: increasing lean protein intake and work on meal planning and easy cooking plans     I have reviewed the above documentation for accuracy and completeness, and I agree with the above. -Lacy Duverney, PA-C  I have reviewed the above note and agree with the plan. -Dennard Nip, MD

## 2017-01-11 ENCOUNTER — Ambulatory Visit (INDEPENDENT_AMBULATORY_CARE_PROVIDER_SITE_OTHER): Payer: 59 | Admitting: Physician Assistant

## 2017-01-11 VITALS — BP 115/76 | HR 94 | Temp 98.8°F | Ht 64.0 in | Wt 314.0 lb

## 2017-01-11 DIAGNOSIS — D509 Iron deficiency anemia, unspecified: Secondary | ICD-10-CM | POA: Diagnosis not present

## 2017-01-11 DIAGNOSIS — IMO0001 Reserved for inherently not codable concepts without codable children: Secondary | ICD-10-CM

## 2017-01-11 DIAGNOSIS — E8881 Metabolic syndrome: Secondary | ICD-10-CM

## 2017-01-11 DIAGNOSIS — F3289 Other specified depressive episodes: Secondary | ICD-10-CM

## 2017-01-11 DIAGNOSIS — E118 Type 2 diabetes mellitus with unspecified complications: Secondary | ICD-10-CM | POA: Insufficient documentation

## 2017-01-11 DIAGNOSIS — Z6841 Body Mass Index (BMI) 40.0 and over, adult: Secondary | ICD-10-CM

## 2017-01-11 DIAGNOSIS — Z9189 Other specified personal risk factors, not elsewhere classified: Secondary | ICD-10-CM

## 2017-01-11 DIAGNOSIS — E669 Obesity, unspecified: Secondary | ICD-10-CM

## 2017-01-11 DIAGNOSIS — E559 Vitamin D deficiency, unspecified: Secondary | ICD-10-CM | POA: Diagnosis not present

## 2017-01-11 DIAGNOSIS — E119 Type 2 diabetes mellitus without complications: Secondary | ICD-10-CM | POA: Insufficient documentation

## 2017-01-11 MED ORDER — BUPROPION HCL ER (SR) 200 MG PO TB12: 200.0000 mg | ORAL_TABLET | Freq: Every day | ORAL | 0 refills | Status: DC

## 2017-01-11 MED FILL — BUPROPION HCL SR 200 MG TAB: 200 | 30 days supply | Qty: 30 | Fill #0

## 2017-01-11 NOTE — Progress Notes (Signed)
Office: [phone]  /  Fax: [phone]   HPI:   Chief Complaint: OBESITY Amanda Murphy is here to discuss her progress with her obesity treatment plan. She is on the Category 3 plan and is following her eating plan approximately 50 % of the time. She states she is exercising at the gym for 60 minutes 3 times per week. [name] maintained weight. She had increase in emotional eating and has not planned ahead as well. She wants variety for dinner. Her weight is (!) 314 lb (142.4 kg) today and has maintained weight over a period of 2 weeks since her last visit. She has lost 10 lbs since starting treatment with [place].  Vitamin D deficiency Amanda Murphy has a diagnosis of vitamin D deficiency. She is currently taking vit D and denies nausea, vomiting or muscle weakness.  At risk for osteopenia Amanda Murphy is at higher risk of osteopenia and osteoporosis due to vitamin D deficiency.   Depression with emotional eating behaviors Amanda Murphy is struggling with emotional eating and using food for comfort to the extent that it is negatively impacting her health. She often snacks when she is not hungry. Amanda Murphy sometimes feels she is out of control and then feels guilty that she made poor food choices. She has been working on behavior modification techniques to help reduce her emotional eating and has been somewhat successful. She shows no sign of suicidal or homicidal ideations.  Depression screen PHQ 2/9 05/19/2016  Decreased Interest 2  Down, Depressed, Hopeless 1  PHQ - 2 Score 3  Altered sleeping 1  Tired, decreased energy 3  Change in appetite 3  Feeling bad or failure about yourself  2  Trouble concentrating 1  Moving slowly or fidgety/restless 2  Suicidal thoughts 0  PHQ-9 Score 15     ALLERGIES: No Known Allergies  MEDICATIONS: Current Outpatient Prescriptions on File Prior to Visit  Medication Sig Dispense Refill  . acetaminophen (TYLENOL) 500 MG tablet Take 1,000 mg by mouth every 6  (six) hours as needed for mild pain.    [name] aspirin EC 325 MG tablet Take 325 mg by mouth daily.    . calcium carbonate (TUMS - DOSED IN MG ELEMENTAL CALCIUM) 500 MG chewable tablet Chew 1 tablet by mouth as needed. Reported on 07/31/2015    . docusate sodium (COLACE) 100 MG capsule Take 100 mg by mouth daily as needed for mild constipation.    [name] ibuprofen (ADVIL,MOTRIN) 600 MG tablet Take 1 tablet (600 mg total) by mouth every 6 (six) hours as needed. 30 tablet 0  . levocetirizine (XYZAL) 5 MG tablet Take 1 tablet (5 mg total) by mouth every evening. (Patient taking differently: Take 5 mg by mouth daily as needed. ) 30 tablet 11  . levonorgestrel (MIRENA) 20 MCG/24HR IUD 1 each by Intrauterine route once.    . Liraglutide -Weight Management (SAXENDA) 18 MG/3ML SOPN Inject 3 mg into the skin every morning. (Patient taking differently: Inject 3 mg into the skin every morning. ) 3 pen 0  . oxyCODONE-acetaminophen (PERCOCET/ROXICET) 5-325 MG tablet Take 1-2 tablets by mouth every 4 (four) hours as needed for severe pain. 15 tablet 0  . Prenatal Vit-Fe Fumarate-FA (PRENATAL MULTIVITAMIN) TABS tablet Take 1 tablet by mouth daily.     . Vitamin D, Ergocalciferol, (DRISDOL) 50000 units CAPS capsule Take 1 capsule (50,000 Units total) by mouth every 7 (seven) days. 4 capsule 0   No current facility-administered medications on file prior to visit.     PAST  MEDICAL HISTORY: Past Medical History:  Diagnosis Date  . Abnormal uterine bleeding (AUB)   . Anemia   . Anxiety   . Asthma    as child - no inhaler - no problem as adult  . Astigmatism   . Fibroids   . H/O diabetes insipidus    s/p craniotomy  . Headache(784.0)    hx - none since surgery  . Hx of blood clots 2016   P. E.  p craniotomy. on Xarelto until 2017.  . Joint pain    ankles and hips  . PCOS (polycystic ovarian syndrome)   . Pneumonia    Hx 3-4 yrs ago   . Pre-diabetes   . Seasonal allergies   . Vision abnormalities   . Vitamin D  deficiency   . Wears glasses     PAST SURGICAL HISTORY: Past Surgical History:  Procedure Laterality Date  . CRANIOTOMY N/A 10/31/2014   Procedure: CRANIOTOMY FOR RESECTION OF TUMOR;  Surgeon: Jovita Gamma, MD;  Location: [facility];  Service: Neurosurgery;  Laterality: N/A;  Craniotomy for resection of tumor  . DILATATION & CURETTAGE/HYSTEROSCOPY WITH MYOSURE N/A 11/12/2016   Procedure: DILATATION & CURETTAGE/HYSTEROSCOPY WITH MYOSURE;  Surgeon: Marylynn Pearson, MD;  Location: [facility];  Service: Gynecology;  Laterality: N/A;  . DILATION AND CURETTAGE OF UTERUS     myomectomy  . MYOMECTOMY     vaginal -   . WISDOM TOOTH EXTRACTION  2006    SOCIAL HISTORY: Social History  Substance Use Topics  . Smoking status: Never Smoker  . Smokeless tobacco: Never Used  . Alcohol use 0.0 oz/week     Comment: occasional wine    FAMILY HISTORY: Family History  Problem Relation Age of Onset  . Hypertension Mother   . Diabetes Mother   . Obesity Mother   . Ovarian cancer Unknown   . Breast cancer Unknown   . Fibroids Unknown     ROS: Review of Systems  Constitutional: Negative for weight loss.  Gastrointestinal: Negative for nausea and vomiting.  Musculoskeletal:       Negative muscle weakness  Psychiatric/Behavioral: Positive for depression. Negative for suicidal ideas.    PHYSICAL EXAM: Blood pressure 115/76, pulse 94, temperature 98.8 F (37.1 C), temperature source Oral, height 5\' 4"  (1.626 m), weight (!) 314 lb (142.4 kg), SpO2 99 %. Body mass index is 53.9 kg/m. Physical Exam  Constitutional: She is oriented to person, place, and time. She appears well-developed and well-nourished.  Cardiovascular: Normal rate.   Pulmonary/Chest: Effort normal.  Musculoskeletal: Normal range of motion.  Neurological: She is oriented to person, place, and time.  Skin: Skin is warm and dry.  Psychiatric: She has a normal mood and affect. Her behavior is normal.  Vitals  reviewed.   RECENT LABS AND TESTS: BMET    Component Value Date/Time   NA 136 11/06/2016 1355   NA 144 09/01/2016 1231   K 4.1 11/06/2016 1355   CL 102 11/06/2016 1355   CO2 26 11/06/2016 1355   GLUCOSE 92 11/06/2016 1355   BUN 10 11/06/2016 1355   BUN 9 09/01/2016 1231   CREATININE 0.92 11/06/2016 1355   CALCIUM 9.1 11/06/2016 1355   GFRNONAA >60 11/06/2016 1355   GFRAA >60 11/06/2016 1355   Lab Results  Component Value Date   HGBA1C 5.6 09/01/2016   HGBA1C 5.8 (H) 05/19/2016   HGBA1C 6.1 09/27/2015   HGBA1C 5.8 09/19/2014   Lab Results  Component Value Date   INSULIN 80.9 (  H) 09/01/2016   CBC    Component Value Date/Time   WBC 10.9 (H) 11/06/2016 1355   RBC 3.95 11/06/2016 1355   HGB 10.2 (L) 11/06/2016 1355   HGB 13.5 05/19/2016 1053   HCT 32.7 (L) 11/06/2016 1355   HCT 41.6 05/19/2016 1053   PLT 489 (H) 11/06/2016 1355   PLT 352 05/19/2016 1053   MCV 82.8 11/06/2016 1355   MCV 92 05/19/2016 1053   MCH 25.8 (L) 11/06/2016 1355   MCHC 31.2 11/06/2016 1355   RDW 16.2 (H) 11/06/2016 1355   RDW 16.1 (H) 05/19/2016 1053   LYMPHSABS 3.1 05/19/2016 1053   MONOABS 0.4 11/23/2014 1339   EOSABS 0.2 05/19/2016 1053   BASOSABS 0.0 05/19/2016 1053   Iron/TIBC/Ferritin/ %Sat    Component Value Date/Time   IRON 63 05/19/2016 1105   TIBC 343 05/19/2016 1105   FERRITIN 34 05/19/2016 1105   IRONPCTSAT 18 05/19/2016 1105   Lipid Panel     Component Value Date/Time   CHOL 172 09/01/2016 1231   TRIG 85 09/01/2016 1231   HDL 67 09/01/2016 1231   CHOLHDL 3 09/27/2015 1010   VLDL 21.6 09/27/2015 1010   LDLCALC 88 09/01/2016 1231   Hepatic Function Panel     Component Value Date/Time   PROT 7.2 11/06/2016 1355   PROT 7.1 09/01/2016 1231   ALBUMIN 3.1 (L) 11/06/2016 1355   ALBUMIN 3.9 09/01/2016 1231   AST 20 11/06/2016 1355   ALT 24 11/06/2016 1355   ALKPHOS 81 11/06/2016 1355   BILITOT 0.2 (L) 11/06/2016 1355   BILITOT 0.2 09/01/2016 1231      Component  Value Date/Time   TSH 1.420 05/19/2016 1053   TSH 0.239 (L) 11/26/2014 1325    ASSESSMENT AND PLAN: Vitamin D deficiency - Plan: VITAMIN D 25 Hydroxy (Vit-D Deficiency, Fractures)  Other depression - Plan: buPROPion (WELLBUTRIN SR) 200 MG 12 hr tablet  Iron deficiency anemia, unspecified iron deficiency anemia type - Plan: Comprehensive metabolic panel  Insulin resistance - Plan: CBC with Differential/Platelet, Hemoglobin A1c, Insulin, random  At risk for osteoporosis  Class 3 obesity without serious comorbidity with body mass index (BMI) of 50.0 to 59.9 in adult, unspecified obesity type ([place])  PLAN:  Vitamin D Deficiency Shantea was informed that low vitamin D levels contributes to fatigue and are associated with obesity, breast, and colon cancer. She agrees to continue to take prescription Vit D @50 ,000 IU every week and we will check labs and will follow up for routine testing of vitamin D, at least 2-3 times per year. She was informed of the risk of over-replacement of vitamin D and agrees to not increase her dose unless he discusses this with [place] first. Geisha agrees to follow up with our clinic in 2 weeks.  At risk for osteopenia Alisen is at risk for osteopenia and osteoporsis due to her vitamin D deficiency. She was encouraged to take her vitamin D and follow her higher calcium diet and increase strengthening exercise to help strengthen her bones and decrease her risk of osteopenia and osteoporosis.  Depression with Emotional Eating Behaviors We discussed behavior modification techniques today to help Yannet deal with her emotional eating and depression. She has agreed to increase Wellbutrin SR to 200 mg qd 330 with no refills and will follow up as directed.  Obesity Jesslynn is currently in the action stage of change. As such, her goal is to continue with weight loss efforts She has agreed to keep a food journal  with 500 to 600 calories and 40+ grams of protein at  supper and follow the Category 3 plan Milderd has been instructed to work up to a goal of 150 minutes of combined cardio and strengthening exercise per week for weight loss and overall health benefits. We discussed the following Behavioral Modification Strategies today: increasing lean protein intake and meal planning & cooking strategies  Fizza has agreed to follow up with our clinic in 2 weeks. She was informed of the importance of frequent follow up visits to maximize her success with intensive lifestyle modifications for her multiple health conditions.  I, Doreene Nest, am acting as transcriptionist for Lacy Duverney, PA-C  I have reviewed the above documentation for accuracy and completeness, and I agree with the above. -Lacy Duverney, PA-C  I have reviewed the above note and agree with the plan. -Dennard Nip, MD   OBESITY BEHAVIORAL INTERVENTION VISIT  Today's visit was # 16 out of 48.  Starting weight: 324 lbs Starting date: 05/19/16 Today's weight : 314 lbs Today's date: 01/11/2017 Total lbs lost to date: 10 (Patients must lose 7 lbs in the first 6 months to continue with counseling)   ASK: We discussed the diagnosis of obesity with Roxanne Mins today and Olin Hauser agreed to give [place] permission to discuss obesity behavioral modification therapy today.  ASSESS: Evella has the diagnosis of obesity and her BMI today is 58 Shalunda is in the action stage of change   ADVISE: Lella was educated on the multiple health risks of obesity as well as the benefit of weight loss to improve her health. She was advised of the need for long term treatment and the importance of lifestyle modifications.  AGREE: Multiple dietary modification options and treatment options were discussed and  Yesennia agreed to keep a food journal with 500 to 600 calories and 40+ grams of protein  and follow the Category 3 plan We discussed the following Behavioral Modification Strategies today:  increasing lean protein intake and meal planning & cooking strategies

## 2017-01-12 ENCOUNTER — Encounter (INDEPENDENT_AMBULATORY_CARE_PROVIDER_SITE_OTHER): Payer: Self-pay

## 2017-01-12 ENCOUNTER — Telehealth (INDEPENDENT_AMBULATORY_CARE_PROVIDER_SITE_OTHER): Payer: Self-pay

## 2017-01-12 LAB — CBC WITH DIFFERENTIAL/PLATELET
Basophils Absolute: 0 10*3/uL (ref 0.0–0.2)
Basos: 0 %
EOS (ABSOLUTE): 0.2 10*3/uL (ref 0.0–0.4)
Eos: 3 %
Hematocrit: 29.4 % — ABNORMAL LOW (ref 34.0–46.6)
Hemoglobin: 8.9 g/dL — ABNORMAL LOW (ref 11.1–15.9)
Immature Grans (Abs): 0 10*3/uL (ref 0.0–0.1)
Immature Granulocytes: 0 %
Lymphocytes Absolute: 3 10*3/uL (ref 0.7–3.1)
Lymphs: 38 %
MCH: 24.8 pg — ABNORMAL LOW (ref 26.6–33.0)
MCHC: 30.3 g/dL — ABNORMAL LOW (ref 31.5–35.7)
MCV: 82 fL (ref 79–97)
Monocytes Absolute: 0.5 10*3/uL (ref 0.1–0.9)
Monocytes: 7 %
Neutrophils Absolute: 4.1 10*3/uL (ref 1.4–7.0)
Neutrophils: 52 %
Platelets: 516 10*3/uL — ABNORMAL HIGH (ref 150–379)
RBC: 3.59 x10E6/uL — ABNORMAL LOW (ref 3.77–5.28)
RDW: 20.3 % — ABNORMAL HIGH (ref 12.3–15.4)
WBC: 7.9 10*3/uL (ref 3.4–10.8)

## 2017-01-12 LAB — COMPREHENSIVE METABOLIC PANEL
ALBUMIN: 3.7 g/dL (ref 3.5–5.5)
ALT: 22 IU/L (ref 0–32)
AST: 18 IU/L (ref 0–40)
Albumin/Globulin Ratio: 1.2 (ref 1.2–2.2)
Alkaline Phosphatase: 84 IU/L (ref 39–117)
BILIRUBIN TOTAL: 0.2 mg/dL (ref 0.0–1.2)
BUN / CREAT RATIO: 12 (ref 9–23)
BUN: 10 mg/dL (ref 6–20)
CHLORIDE: 104 mmol/L (ref 96–106)
CO2: 24 mmol/L (ref 20–29)
CREATININE: 0.84 mg/dL (ref 0.57–1.00)
Calcium: 8.6 mg/dL — ABNORMAL LOW (ref 8.7–10.2)
GFR calc non Af Amer: 89 mL/min/{1.73_m2} (ref >59)
GFR, EST AFRICAN AMERICAN: 103 mL/min/{1.73_m2} (ref >59)
GLUCOSE: 80 mg/dL (ref 65–99)
Globulin, Total: 3 g/dL (ref 1.5–4.5)
Potassium: 4.5 mmol/L (ref 3.5–5.2)
Sodium: 140 mmol/L (ref 134–144)
TOTAL PROTEIN: 6.7 g/dL (ref 6.0–8.5)

## 2017-01-12 LAB — VITAMIN D 25 HYDROXY (VIT D DEFICIENCY, FRACTURES): Vit D, 25-Hydroxy: 33.1 ng/mL (ref 30.0–100.0)

## 2017-01-12 LAB — HEMOGLOBIN A1C
Est. average glucose Bld gHb Est-mCnc: 111 mg/dL
Hgb A1c MFr Bld: 5.5 % (ref 4.8–5.6)

## 2017-01-12 LAB — INSULIN, RANDOM: INSULIN: 52.3 u[IU]/mL — ABNORMAL HIGH (ref 2.6–24.9)

## 2017-01-14 NOTE — Telephone Encounter (Signed)
Amanda Murphy called and said that she would follow up with her PCP about her Hemoglobin level being low. Informed Lacy Duverney, PA that I did speak with [place]. AW

## 2017-01-15 MED FILL — VIT D2 1.25 MG (50,000 UNIT: 1.25 MG | 28 days supply | Qty: 4 | Fill #0

## 2017-01-25 ENCOUNTER — Encounter (INDEPENDENT_AMBULATORY_CARE_PROVIDER_SITE_OTHER): Payer: Self-pay

## 2017-01-25 ENCOUNTER — Ambulatory Visit (INDEPENDENT_AMBULATORY_CARE_PROVIDER_SITE_OTHER): Payer: 59 | Admitting: Physician Assistant

## 2017-02-04 DIAGNOSIS — Z30431 Encounter for routine checking of intrauterine contraceptive device: Secondary | ICD-10-CM | POA: Diagnosis not present

## 2017-03-10 ENCOUNTER — Ambulatory Visit (INDEPENDENT_AMBULATORY_CARE_PROVIDER_SITE_OTHER): Payer: 59 | Admitting: Physician Assistant

## 2017-03-10 VITALS — BP 114/74 | HR 91 | Temp 98.8°F | Ht 64.0 in | Wt 316.0 lb

## 2017-03-10 DIAGNOSIS — D508 Other iron deficiency anemias: Secondary | ICD-10-CM | POA: Diagnosis not present

## 2017-03-10 DIAGNOSIS — Z6841 Body Mass Index (BMI) 40.0 and over, adult: Secondary | ICD-10-CM

## 2017-03-10 MED ORDER — LIRAGLUTIDE -WEIGHT MANAGEMENT 18 MG/3ML ~~LOC~~ SOPN: 3.0000 mg | PEN_INJECTOR | Freq: Every morning | SUBCUTANEOUS | 0 refills | Status: DC

## 2017-03-10 MED ORDER — INSULIN PEN NEEDLE 32G X 4 MM MISC: 1.0000 | Freq: Every day | 0 refills | Status: DC

## 2017-03-10 NOTE — Progress Notes (Signed)
Office: [phone]  /  Fax: [phone]   HPI:   Chief Complaint: OBESITY Amanda Murphy is here to discuss her progress with her obesity treatment plan. She is on the Category 3 plan and is following her eating plan approximately 0 % of the time. She states she is exercising 0 minutes 0 times per week. Amanda Murphy has not followed up from last visit and was on vacations, so she got off track. She is motivated to continue with weight loss and to get back on track. Her weight is (!) 316 lb (143.3 kg) today and has had a weight gain of 2 pounds over a period of 8 weeks since her last visit. She has lost 8 lbs since starting treatment with [place].  Iron Deficiency Anemia Amanda Murphy has a diagnosis of anemia with a history of heavy menstrual bleeding. She is now on IUD and menstrual cycles are more regular. She follows with OB GYN and she denies chest pain, dyspnea or fatigue. She is on iron supplementation.   ALLERGIES: No Known Allergies  MEDICATIONS: Current Outpatient Prescriptions on File Prior to Visit  Medication Sig Dispense Refill   acetaminophen (TYLENOL) 500 MG tablet Take 1,000 mg by mouth every 6 (six) hours as needed for mild pain.     aspirin EC 325 MG tablet Take 325 mg by mouth daily.     buPROPion (WELLBUTRIN SR) 200 MG 12 hr tablet Take 1 tablet (200 mg total) by mouth daily. 30 tablet 0   calcium carbonate (TUMS - DOSED IN MG ELEMENTAL CALCIUM) 500 MG chewable tablet Chew 1 tablet by mouth as needed. Reported on 07/31/2015     docusate sodium (COLACE) 100 MG capsule Take 100 mg by mouth daily as needed for mild constipation.     ibuprofen (ADVIL,MOTRIN) 600 MG tablet Take 1 tablet (600 mg total) by mouth every 6 (six) hours as needed. 30 tablet 0   levocetirizine (XYZAL) 5 MG tablet Take 1 tablet (5 mg total) by mouth every evening. (Patient taking differently: Take 5 mg by mouth daily as needed. ) 30 tablet 11   levonorgestrel (MIRENA) 20 MCG/24HR IUD 1 each by Intrauterine  route once.     Liraglutide -Weight Management (SAXENDA) 18 MG/3ML SOPN Inject 3 mg into the skin every morning. (Patient taking differently: Inject 3 mg into the skin every morning. ) 3 pen 0   Melatonin 3 MG TABS Take by mouth.     Prenatal Vit-Fe Fumarate-FA (PRENATAL MULTIVITAMIN) TABS tablet Take 1 tablet by mouth daily.      Vitamin D, Ergocalciferol, (DRISDOL) 50000 units CAPS capsule Take 1 capsule (50,000 Units total) by mouth every 7 (seven) days. 4 capsule 0   No current facility-administered medications on file prior to visit.     PAST MEDICAL HISTORY: Past Medical History:  Diagnosis Date   Abnormal uterine bleeding (AUB)    Anemia    Anxiety    Asthma    as child - no inhaler - no problem as adult   Astigmatism    Fibroids    H/O diabetes insipidus    s/p craniotomy   Headache(784.0)    hx - none since surgery   Hx of blood clots 2016   P. E.  p craniotomy. on Xarelto until 2017.   Joint pain    ankles and hips   PCOS (polycystic ovarian syndrome)    Pneumonia    Hx 3-4 yrs ago    Pre-diabetes    Seasonal allergies  Vision abnormalities    Vitamin D deficiency    Wears glasses     PAST SURGICAL HISTORY: Past Surgical History:  Procedure Laterality Date   CRANIOTOMY N/A 10/31/2014   Procedure: CRANIOTOMY FOR RESECTION OF TUMOR;  Surgeon: Jovita Gamma, MD;  Location: [facility];  Service: Neurosurgery;  Laterality: N/A;  Craniotomy for resection of tumor   DILATATION & CURETTAGE/HYSTEROSCOPY WITH MYOSURE N/A 11/12/2016   Procedure: [place];  Surgeon: Marylynn Pearson, MD;  Location: [facility];  Service: Gynecology;  Laterality: N/A;   DILATION AND CURETTAGE OF UTERUS     myomectomy   MYOMECTOMY     vaginal -    WISDOM TOOTH EXTRACTION  2006    SOCIAL HISTORY: Social History  Substance Use Topics   Smoking status: Never Smoker   Smokeless tobacco: Never Used   Alcohol use 0.0  oz/week     Comment: occasional wine    FAMILY HISTORY: Family History  Problem Relation Age of Onset   Hypertension Mother    Diabetes Mother    Obesity Mother    Ovarian cancer Unknown    Breast cancer Unknown    Fibroids Unknown     ROS: Review of Systems  Constitutional: Negative for malaise/fatigue and weight loss.  Respiratory: Negative for shortness of breath.   Cardiovascular: Negative for chest pain.    PHYSICAL EXAM: Blood pressure 114/74, pulse 91, temperature 98.8 F (37.1 C), height 5\' 4"  (1.626 m), weight (!) 316 lb (143.3 kg), SpO2 98 %. Body mass index is 54.24 kg/m. Physical Exam  Constitutional: She is oriented to person, place, and time. She appears well-developed and well-nourished.  Cardiovascular: Normal rate.   Pulmonary/Chest: Effort normal.  Musculoskeletal: Normal range of motion.  Neurological: She is oriented to person, place, and time.  Skin: Skin is warm and dry.  Psychiatric: She has a normal mood and affect. Her behavior is normal.  Vitals reviewed.   RECENT LABS AND TESTS: BMET    Component Value Date/Time   NA 140 01/11/2017 1205   K 4.5 01/11/2017 1205   CL 104 01/11/2017 1205   CO2 24 01/11/2017 1205   GLUCOSE 80 01/11/2017 1205   GLUCOSE 92 11/06/2016 1355   BUN 10 01/11/2017 1205   CREATININE 0.84 01/11/2017 1205   CALCIUM 8.6 (L) 01/11/2017 1205   GFRNONAA 89 01/11/2017 1205   GFRAA 103 01/11/2017 1205   Lab Results  Component Value Date   HGBA1C 5.5 01/11/2017   HGBA1C 5.6 09/01/2016   HGBA1C 5.8 (H) 05/19/2016   HGBA1C 6.1 09/27/2015   HGBA1C 5.8 09/19/2014   Lab Results  Component Value Date   INSULIN 52.3 (H) 01/11/2017   INSULIN 80.9 (H) 09/01/2016   CBC    Component Value Date/Time   WBC 7.9 01/11/2017 1205   WBC 10.9 (H) 11/06/2016 1355   RBC 3.59 (L) 01/11/2017 1205   RBC 3.95 11/06/2016 1355   HGB 8.9 (L) 01/11/2017 1205   HCT 29.4 (L) 01/11/2017 1205   PLT 516 (H) 01/11/2017 1205   MCV  82 01/11/2017 1205   MCH 24.8 (L) 01/11/2017 1205   MCH 25.8 (L) 11/06/2016 1355   MCHC 30.3 (L) 01/11/2017 1205   MCHC 31.2 11/06/2016 1355   RDW 20.3 (H) 01/11/2017 1205   LYMPHSABS 3.0 01/11/2017 1205   MONOABS 0.4 11/23/2014 1339   EOSABS 0.2 01/11/2017 1205   BASOSABS 0.0 01/11/2017 1205   Iron/TIBC/Ferritin/ %Sat    Component Value Date/Time  IRON 63 05/19/2016 1105   TIBC 343 05/19/2016 1105   FERRITIN 34 05/19/2016 1105   IRONPCTSAT 18 05/19/2016 1105   Lipid Panel     Component Value Date/Time   CHOL 172 09/01/2016 1231   TRIG 85 09/01/2016 1231   HDL 67 09/01/2016 1231   CHOLHDL 3 09/27/2015 1010   VLDL 21.6 09/27/2015 1010   LDLCALC 88 09/01/2016 1231   Hepatic Function Panel     Component Value Date/Time   PROT 6.7 01/11/2017 1205   ALBUMIN 3.7 01/11/2017 1205   AST 18 01/11/2017 1205   ALT 22 01/11/2017 1205   ALKPHOS 84 01/11/2017 1205   BILITOT 0.2 01/11/2017 1205      Component Value Date/Time   TSH 1.420 05/19/2016 1053   TSH 0.239 (L) 11/26/2014 1325    ASSESSMENT AND PLAN: Other iron deficiency anemia  Class 3 severe obesity with serious comorbidity and body mass index (BMI) of 50.0 to 59.9 in adult, unspecified obesity type ([place]) - Plan: Liraglutide -Weight Management (SAXENDA) 18 MG/3ML SOPN, Insulin Pen Needle (BD PEN NEEDLE NANO U/F) 32G X 4 MM MISC  PLAN:  Iron Deficiency Anemia The diagnosis of Iron deficiency anemia was discussed with Amanda Murphy and was explained in detail. She was given suggestions of iron rich foods. Amanda Murphy agrees to continue her medications as prescribed and will follow up as directed.  We spent > than 50% of the 15 minute visit on the counseling as documented in the note.  Obesity Amanda Murphy is currently in the action stage of change. As such, her goal is to continue with weight loss efforts She has agreed to follow the Category 3 plan Amanda Murphy has been instructed to work up to a goal of 150 minutes of combined  cardio and strengthening exercise per week for weight loss and overall health benefits. We discussed the following Behavioral Modification Strategies today: increasing lean protein intake and keeping healthy foods in the home. We discussed various medication options to help Amanda Murphy with her weight loss efforts and we both agreed to continue Saxenda 5 pens with no refills (patient is to increase to 2.1 mg) and we will refill nano needles.  Amanda Murphy has agreed to follow up with our clinic in 2 weeks. She was informed of the importance of frequent follow up visits to maximize her success with intensive lifestyle modifications for her multiple health conditions.  Amanda Murphy, Amanda Murphy, am acting as transcriptionist for Lacy Duverney, PA-C  Amanda Murphy have reviewed the above documentation for accuracy and completeness, and Amanda Murphy agree with the above. -Lacy Duverney, PA-C  Amanda Murphy have reviewed the above note and agree with the plan. -Dennard Nip, MD  OBESITY BEHAVIORAL INTERVENTION VISIT  Today's visit was # 17 out of 24.  Starting weight: 324 lbs Starting date: 05/19/16 Today's weight : 316 lbs Today's date: 03/10/2017 Total lbs lost to date: 8 (Patients must lose 7 lbs in the first 6 months to continue with counseling)   ASK: We discussed the diagnosis of obesity with Amanda Murphy today and Amanda Murphy agreed to give [place] permission to discuss obesity behavioral modification therapy today.  ASSESS: Breyah has the diagnosis of obesity and her BMI today is 62.21 Shayla is in the action stage of change   ADVISE: Nanetta was educated on the multiple health risks of obesity as well as the benefit of weight loss to improve her health. She was advised of the need for long term treatment and the importance of lifestyle modifications.  AGREE: Multiple  dietary modification options and treatment options were discussed and  Collyns agreed to follow the Category 3 plan We discussed the following Behavioral  Modification Strategies today: increasing lean protein intake and keeping healthy foods in the home

## 2017-03-12 MED FILL — UNIFINE PENTIPS 32GX5/32": 32G X 4 MM | 90 days supply | Qty: 100 | Fill #0

## 2017-03-12 MED FILL — SAXENDA 18 MG/3 ML PEN: 18 | 30 days supply | Qty: 15 | Fill #0

## 2017-03-12 MED FILL — UNIFINE PENTIPS 32GX5/32: 32G X 4 MM | 90 days supply | Qty: 100 | Fill #0

## 2017-03-16 ENCOUNTER — Encounter (INDEPENDENT_AMBULATORY_CARE_PROVIDER_SITE_OTHER): Payer: Self-pay

## 2017-03-24 ENCOUNTER — Ambulatory Visit (INDEPENDENT_AMBULATORY_CARE_PROVIDER_SITE_OTHER): Payer: 59 | Admitting: Physician Assistant

## 2017-03-24 VITALS — BP 121/75 | HR 92 | Temp 98.4°F | Ht 64.0 in | Wt 320.0 lb

## 2017-03-24 DIAGNOSIS — E559 Vitamin D deficiency, unspecified: Secondary | ICD-10-CM

## 2017-03-24 DIAGNOSIS — Z6841 Body Mass Index (BMI) 40.0 and over, adult: Secondary | ICD-10-CM | POA: Diagnosis not present

## 2017-03-24 NOTE — Progress Notes (Signed)
Office: [phone]  /  Fax: [phone]   HPI:   Chief Complaint: OBESITY Amanda Murphy is here to discuss her progress with her obesity treatment plan. She is on the Category 3 plan and is following her eating plan approximately 50 % of the time. She states she is exercising water aerobics and walking for 60 minutes 3 times per week. Amanda Murphy states she has not been planning her meals ahead as well. She has also noticed increased hunger with more exercise. Her weight is (!) 320 lb (145.2 kg) today and has had a weight gain of 4 pounds over a period of 2 weeks since her last visit. She has lost 4 lbs since starting treatment with [place].  Vitamin D deficiency Amanda Murphy has a diagnosis of vitamin D deficiency. She is currently taking vit D and denies nausea, vomiting or muscle weakness.   ALLERGIES: No Known Allergies  MEDICATIONS: Current Outpatient Prescriptions on File Prior to Visit  Medication Sig Dispense Refill  . acetaminophen (TYLENOL) 500 MG tablet Take 1,000 mg by mouth every 6 (six) hours as needed for mild pain.    [name] aspirin EC 325 MG tablet Take 325 mg by mouth daily.    . calcium carbonate (TUMS - DOSED IN MG ELEMENTAL CALCIUM) 500 MG chewable tablet Chew 1 tablet by mouth as needed. Reported on 07/31/2015    . docusate sodium (COLACE) 100 MG capsule Take 100 mg by mouth daily as needed for mild constipation.    . Insulin Pen Needle (BD PEN NEEDLE NANO U/F) 32G X 4 MM MISC 1 Package by Does not apply route daily at 12 noon. 100 each 0  . levocetirizine (XYZAL) 5 MG tablet Take 1 tablet (5 mg total) by mouth every evening. (Patient taking differently: Take 5 mg by mouth daily as needed. ) 30 tablet 11  . levonorgestrel (MIRENA) 20 MCG/24HR IUD 1 each by Intrauterine route once.    . Liraglutide -Weight Management (SAXENDA) 18 MG/3ML SOPN Inject 3 mg into the skin every morning. 5 pen 0  . Melatonin 3 MG TABS Take by mouth.    . Prenatal Vit-Fe Fumarate-FA (PRENATAL MULTIVITAMIN)  TABS tablet Take 1 tablet by mouth daily.     . Vitamin D, Ergocalciferol, (DRISDOL) 50000 units CAPS capsule Take 1 capsule (50,000 Units total) by mouth every 7 (seven) days. 4 capsule 0   No current facility-administered medications on file prior to visit.     PAST MEDICAL HISTORY: Past Medical History:  Diagnosis Date  . Abnormal uterine bleeding (AUB)   . Anemia   . Anxiety   . Asthma    as child - no inhaler - no problem as adult  . Astigmatism   . Fibroids   . H/O diabetes insipidus    s/p craniotomy  . Headache(784.0)    hx - none since surgery  . Hx of blood clots 2016   P. E.  p craniotomy. on Xarelto until 2017.  . Joint pain    ankles and hips  . PCOS (polycystic ovarian syndrome)   . Pneumonia    Hx 3-4 yrs ago   . Pre-diabetes   . Seasonal allergies   . Vision abnormalities   . Vitamin D deficiency   . Wears glasses     PAST SURGICAL HISTORY: Past Surgical History:  Procedure Laterality Date  . CRANIOTOMY N/A 10/31/2014   Procedure: CRANIOTOMY FOR RESECTION OF TUMOR;  Surgeon: Jovita Gamma, MD;  Location: [facility];  Service: Neurosurgery;  Laterality: N/A;  Craniotomy for resection of tumor  . DILATATION & CURETTAGE/HYSTEROSCOPY WITH MYOSURE N/A 11/12/2016   Procedure: DILATATION & CURETTAGE/HYSTEROSCOPY WITH MYOSURE;  Surgeon: Marylynn Pearson, MD;  Location: [facility];  Service: Gynecology;  Laterality: N/A;  . DILATION AND CURETTAGE OF UTERUS     myomectomy  . MYOMECTOMY     vaginal -   . WISDOM TOOTH EXTRACTION  2006    SOCIAL HISTORY: Social History  Substance Use Topics  . Smoking status: Never Smoker  . Smokeless tobacco: Never Used  . Alcohol use 0.0 oz/week     Comment: occasional wine    FAMILY HISTORY: Family History  Problem Relation Age of Onset  . Hypertension Mother   . Diabetes Mother   . Obesity Mother   . Ovarian cancer Unknown   . Breast cancer Unknown   . Fibroids Unknown     ROS: Review of Systems    Constitutional: Negative for weight loss.  Gastrointestinal: Negative for nausea and vomiting.  Musculoskeletal:       Negative muscle weakness    PHYSICAL EXAM: Blood pressure 121/75, pulse 92, temperature 98.4 F (36.9 C), height 5\' 4"  (1.626 m), weight (!) 320 lb (145.2 kg), last menstrual period 02/19/2017, SpO2 99 %. Body mass index is 54.93 kg/m. Physical Exam  Constitutional: She is oriented to person, place, and time. She appears well-developed and well-nourished.  Cardiovascular: Normal rate.   Pulmonary/Chest: Effort normal.  Musculoskeletal: Normal range of motion.  Neurological: She is oriented to person, place, and time.  Skin: Skin is warm and dry.  Psychiatric: She has a normal mood and affect. Her behavior is normal.  Vitals reviewed.   RECENT LABS AND TESTS: BMET    Component Value Date/Time   NA 140 01/11/2017 1205   K 4.5 01/11/2017 1205   CL 104 01/11/2017 1205   CO2 24 01/11/2017 1205   GLUCOSE 80 01/11/2017 1205   GLUCOSE 92 11/06/2016 1355   BUN 10 01/11/2017 1205   CREATININE 0.84 01/11/2017 1205   CALCIUM 8.6 (L) 01/11/2017 1205   GFRNONAA 89 01/11/2017 1205   GFRAA 103 01/11/2017 1205   Lab Results  Component Value Date   HGBA1C 5.5 01/11/2017   HGBA1C 5.6 09/01/2016   HGBA1C 5.8 (H) 05/19/2016   HGBA1C 6.1 09/27/2015   HGBA1C 5.8 09/19/2014   Lab Results  Component Value Date   INSULIN 52.3 (H) 01/11/2017   INSULIN 80.9 (H) 09/01/2016   CBC    Component Value Date/Time   WBC 7.9 01/11/2017 1205   WBC 10.9 (H) 11/06/2016 1355   RBC 3.59 (L) 01/11/2017 1205   RBC 3.95 11/06/2016 1355   HGB 8.9 (L) 01/11/2017 1205   HCT 29.4 (L) 01/11/2017 1205   PLT 516 (H) 01/11/2017 1205   MCV 82 01/11/2017 1205   MCH 24.8 (L) 01/11/2017 1205   MCH 25.8 (L) 11/06/2016 1355   MCHC 30.3 (L) 01/11/2017 1205   MCHC 31.2 11/06/2016 1355   RDW 20.3 (H) 01/11/2017 1205   LYMPHSABS 3.0 01/11/2017 1205   MONOABS 0.4 11/23/2014 1339   EOSABS 0.2  01/11/2017 1205   BASOSABS 0.0 01/11/2017 1205   Iron/TIBC/Ferritin/ %Sat    Component Value Date/Time   IRON 63 05/19/2016 1105   TIBC 343 05/19/2016 1105   FERRITIN 34 05/19/2016 1105   IRONPCTSAT 18 05/19/2016 1105   Lipid Panel     Component Value Date/Time   CHOL 172 09/01/2016 1231   TRIG 85 09/01/2016 1231  HDL 67 09/01/2016 1231   CHOLHDL 3 09/27/2015 1010   VLDL 21.6 09/27/2015 1010   LDLCALC 88 09/01/2016 1231   Hepatic Function Panel     Component Value Date/Time   PROT 6.7 01/11/2017 1205   ALBUMIN 3.7 01/11/2017 1205   AST 18 01/11/2017 1205   ALT 22 01/11/2017 1205   ALKPHOS 84 01/11/2017 1205   BILITOT 0.2 01/11/2017 1205      Component Value Date/Time   TSH 1.420 05/19/2016 1053   TSH 0.239 (L) 11/26/2014 1325    ASSESSMENT AND PLAN: Vitamin D deficiency  Class 3 severe obesity with serious comorbidity and body mass index (BMI) of 50.0 to 59.9 in adult, unspecified obesity type (HCC)  PLAN:  Vitamin D Deficiency Amanda Murphy was informed that low vitamin D levels contributes to fatigue and are associated with obesity, breast, and colon cancer. She agrees to continue to take prescription Vit D @50 ,000 IU every week and will follow up for routine testing of vitamin D, at least 2-3 times per year. She was informed of the risk of over-replacement of vitamin D and agrees to not increase her dose unless he discusses this with [place] first.  We spent > than 50% of the 15 minute visit on the counseling as documented in the note.  Obesity Amanda Murphy is currently in the action stage of change. As such, her goal is to continue with weight loss efforts She has agreed to follow the Category 3 plan Amanda Murphy has been instructed to work up to a goal of 150 minutes of combined cardio and strengthening exercise per week for weight loss and overall health benefits. We discussed the following Behavioral Modification Strategies today: increasing lean protein intake and  planning for success  Yaritzi has agreed to follow up with our clinic in 2 weeks. She was informed of the importance of frequent follow up visits to maximize her success with intensive lifestyle modifications for her multiple health conditions.  I, Amanda Murphy, am acting as transcriptionist for Amanda Duverney, PA-C  I have reviewed the above documentation for accuracy and completeness, and I agree with the above. -Amanda Duverney, PA-C  I have reviewed the above note and agree with the plan. -Amanda Nip, MD   OBESITY BEHAVIORAL INTERVENTION VISIT  Today's visit was # 18 out of 22.  Starting weight: 324 lbs Starting date: 05/19/16 Today's weight : 320 lbs  Today's date: 03/24/2017 Total lbs lost to date: 4 (Patients must lose 7 lbs in the first 6 months to continue with counseling)   ASK: We discussed the diagnosis of obesity with Amanda Murphy today and Amanda Murphy agreed to give [place] permission to discuss obesity behavioral modification therapy today.  ASSESS: Cythnia has the diagnosis of obesity and her BMI today is 15.9 Lydiah is in the action stage of change   ADVISE: Triva was educated on the multiple health risks of obesity as well as the benefit of weight loss to improve her health. She was advised of the need for long term treatment and the importance of lifestyle modifications.  AGREE: Multiple dietary modification options and treatment options were discussed and  Joan agreed to follow the Category 3 plan We discussed the following Behavioral Modification Strategies today: increasing lean protein intake and planning for success

## 2017-04-07 ENCOUNTER — Ambulatory Visit (INDEPENDENT_AMBULATORY_CARE_PROVIDER_SITE_OTHER): Payer: 59 | Admitting: Physician Assistant

## 2017-04-07 VITALS — BP 117/78 | HR 95 | Temp 98.6°F | Ht 64.0 in | Wt 315.0 lb

## 2017-04-07 DIAGNOSIS — D508 Other iron deficiency anemias: Secondary | ICD-10-CM | POA: Diagnosis not present

## 2017-04-07 DIAGNOSIS — Z9189 Other specified personal risk factors, not elsewhere classified: Secondary | ICD-10-CM

## 2017-04-07 DIAGNOSIS — D649 Anemia, unspecified: Secondary | ICD-10-CM | POA: Insufficient documentation

## 2017-04-07 DIAGNOSIS — Z6841 Body Mass Index (BMI) 40.0 and over, adult: Secondary | ICD-10-CM

## 2017-04-07 DIAGNOSIS — E559 Vitamin D deficiency, unspecified: Secondary | ICD-10-CM

## 2017-04-07 MED ORDER — VITAMIN D (ERGOCALCIFEROL) 1.25 MG (50000 UNIT) PO CAPS: 50000.0000 [IU] | ORAL_CAPSULE | ORAL | 0 refills | Status: DC

## 2017-04-07 NOTE — Progress Notes (Signed)
Office: [phone]  /  Fax: [phone]   HPI:   Chief Complaint: OBESITY Amanda Murphy is here to discuss her progress with her obesity treatment plan. She is on the Category 3 plan and is following her eating plan approximately 90 % of the time. She states she is exercising 0 minutes 0 times per week. Amanda Murphy continues to do well with weight loss. She has been focusing more on meal planning and manages to get some exercise in. She states she has been getting her required daily protein and now her hunger is better controlled. She would like more meal planning ideas.  Her weight is (!) 315 lb (142.9 kg) today and has had a weight loss of 5 pounds over a period of 2 weeks since her last visit. She has lost 9 lbs since starting treatment with [place].  Vitamin D deficiency Amanda Murphy has a diagnosis of vitamin D deficiency. She is currently taking vit D and denies nausea, vomiting or muscle weakness.  At risk for osteopenia and osteoporosis Amanda Murphy is at higher risk of osteopenia and osteoporosis due to vitamin D deficiency.   Iron Deficiency Anemia Amanda Murphy has a diagnosis of iron deficiency anemia.  She notes fatigue and is on iron supplementation. Amanda Murphy denies chest pain or dyspnea.  ALLERGIES: No Known Allergies  MEDICATIONS: Current Outpatient Prescriptions on File Prior to Visit  Medication Sig Dispense Refill  . acetaminophen (TYLENOL) 500 MG tablet Take 1,000 mg by mouth every 6 (six) hours as needed for mild pain.    [name] aspirin EC 325 MG tablet Take 325 mg by mouth daily.    . calcium carbonate (TUMS - DOSED IN MG ELEMENTAL CALCIUM) 500 MG chewable tablet Chew 1 tablet by mouth as needed. Reported on 07/31/2015    . docusate sodium (COLACE) 100 MG capsule Take 100 mg by mouth daily as needed for mild constipation.    . Insulin Pen Needle (BD PEN NEEDLE NANO U/F) 32G X 4 MM MISC 1 Package by Does not apply route daily at 12 noon. 100 each 0  . levocetirizine (XYZAL) 5 MG tablet Take  1 tablet (5 mg total) by mouth every evening. (Patient taking differently: Take 5 mg by mouth daily as needed. ) 30 tablet 11  . levonorgestrel (MIRENA) 20 MCG/24HR IUD 1 each by Intrauterine route once.    . Liraglutide -Weight Management (SAXENDA) 18 MG/3ML SOPN Inject 3 mg into the skin every morning. 5 pen 0  . Melatonin 3 MG TABS Take by mouth.    . Prenatal Vit-Fe Fumarate-FA (PRENATAL MULTIVITAMIN) TABS tablet Take 1 tablet by mouth daily.      No current facility-administered medications on file prior to visit.     PAST MEDICAL HISTORY: Past Medical History:  Diagnosis Date  . Abnormal uterine bleeding (AUB)   . Anemia   . Anxiety   . Asthma    as child - no inhaler - no problem as adult  . Astigmatism   . Fibroids   . H/O diabetes insipidus    s/p craniotomy  . Headache(784.0)    hx - none since surgery  . Hx of blood clots 2016   P. E.  p craniotomy. on Xarelto until 2017.  . Joint pain    ankles and hips  . PCOS (polycystic ovarian syndrome)   . Pneumonia    Hx 3-4 yrs ago   . Pre-diabetes   . Seasonal allergies   . Vision abnormalities   . Vitamin D deficiency   .  Wears glasses     PAST SURGICAL HISTORY: Past Surgical History:  Procedure Laterality Date  . CRANIOTOMY N/A 10/31/2014   Procedure: CRANIOTOMY FOR RESECTION OF TUMOR;  Surgeon: Jovita Gamma, MD;  Location: [facility];  Service: Neurosurgery;  Laterality: N/A;  Craniotomy for resection of tumor  . DILATATION & CURETTAGE/HYSTEROSCOPY WITH MYOSURE N/A 11/12/2016   Procedure: DILATATION & CURETTAGE/HYSTEROSCOPY WITH MYOSURE;  Surgeon: Marylynn Pearson, MD;  Location: [facility];  Service: Gynecology;  Laterality: N/A;  . DILATION AND CURETTAGE OF UTERUS     myomectomy  . MYOMECTOMY     vaginal -   . WISDOM TOOTH EXTRACTION  2006    SOCIAL HISTORY: Social History  Substance Use Topics  . Smoking status: Never Smoker  . Smokeless tobacco: Never Used  . Alcohol use 0.0 oz/week     Comment:  occasional wine    FAMILY HISTORY: Family History  Problem Relation Age of Onset  . Hypertension Mother   . Diabetes Mother   . Obesity Mother   . Ovarian cancer Unknown   . Breast cancer Unknown   . Fibroids Unknown     ROS: Review of Systems  Constitutional: Positive for malaise/fatigue and weight loss.  Respiratory: Negative for shortness of breath.   Cardiovascular: Negative for chest pain.  Gastrointestinal: Negative for nausea and vomiting.  Musculoskeletal:       Negative muscle weakness    PHYSICAL EXAM: Blood pressure 117/78, pulse 95, temperature 98.6 F (37 C), temperature source Oral, height 5\' 4"  (1.626 m), weight (!) 315 lb (142.9 kg), SpO2 98 %. Body mass index is 54.07 kg/m. Physical Exam  Constitutional: She is oriented to person, place, and time. She appears well-developed and well-nourished.  Cardiovascular: Normal rate.   Pulmonary/Chest: Effort normal.  Musculoskeletal: Normal range of motion.  Neurological: She is oriented to person, place, and time.  Skin: Skin is warm and dry.  Psychiatric: She has a normal mood and affect. Her behavior is normal.  Vitals reviewed.   RECENT LABS AND TESTS: BMET    Component Value Date/Time   NA 140 01/11/2017 1205   K 4.5 01/11/2017 1205   CL 104 01/11/2017 1205   CO2 24 01/11/2017 1205   GLUCOSE 80 01/11/2017 1205   GLUCOSE 92 11/06/2016 1355   BUN 10 01/11/2017 1205   CREATININE 0.84 01/11/2017 1205   CALCIUM 8.6 (L) 01/11/2017 1205   GFRNONAA 89 01/11/2017 1205   GFRAA 103 01/11/2017 1205   Lab Results  Component Value Date   HGBA1C 5.5 01/11/2017   HGBA1C 5.6 09/01/2016   HGBA1C 5.8 (H) 05/19/2016   HGBA1C 6.1 09/27/2015   HGBA1C 5.8 09/19/2014   Lab Results  Component Value Date   INSULIN 52.3 (H) 01/11/2017   INSULIN 80.9 (H) 09/01/2016   CBC    Component Value Date/Time   WBC 7.9 01/11/2017 1205   WBC 10.9 (H) 11/06/2016 1355   RBC 3.59 (L) 01/11/2017 1205   RBC 3.95 11/06/2016  1355   HGB 8.9 (L) 01/11/2017 1205   HCT 29.4 (L) 01/11/2017 1205   PLT 516 (H) 01/11/2017 1205   MCV 82 01/11/2017 1205   MCH 24.8 (L) 01/11/2017 1205   MCH 25.8 (L) 11/06/2016 1355   MCHC 30.3 (L) 01/11/2017 1205   MCHC 31.2 11/06/2016 1355   RDW 20.3 (H) 01/11/2017 1205   LYMPHSABS 3.0 01/11/2017 1205   MONOABS 0.4 11/23/2014 1339   EOSABS 0.2 01/11/2017 1205   BASOSABS 0.0 01/11/2017 1205  Iron/TIBC/Ferritin/ %Sat    Component Value Date/Time   IRON 63 05/19/2016 1105   TIBC 343 05/19/2016 1105   FERRITIN 34 05/19/2016 1105   IRONPCTSAT 18 05/19/2016 1105   Lipid Panel     Component Value Date/Time   CHOL 172 09/01/2016 1231   TRIG 85 09/01/2016 1231   HDL 67 09/01/2016 1231   CHOLHDL 3 09/27/2015 1010   VLDL 21.6 09/27/2015 1010   LDLCALC 88 09/01/2016 1231   Hepatic Function Panel     Component Value Date/Time   PROT 6.7 01/11/2017 1205   ALBUMIN 3.7 01/11/2017 1205   AST 18 01/11/2017 1205   ALT 22 01/11/2017 1205   ALKPHOS 84 01/11/2017 1205   BILITOT 0.2 01/11/2017 1205      Component Value Date/Time   TSH 1.420 05/19/2016 1053   TSH 0.239 (L) 11/26/2014 1325    ASSESSMENT AND PLAN: Vitamin D deficiency - Plan: Vitamin D, Ergocalciferol, (DRISDOL) 50000 units CAPS capsule  Other iron deficiency anemia  At risk for osteoporosis  Class 3 severe obesity with serious comorbidity and body mass index (BMI) of 50.0 to 59.9 in adult, unspecified obesity type ([place])  PLAN:  Vitamin D Deficiency Amanda Murphy was informed that low vitamin D levels contributes to fatigue and are associated with obesity, breast, and colon cancer. She agrees to continue to take prescription Vit D @50 ,000 IU every week #4 with no refills and will follow up for routine testing of vitamin D, at least 2-3 times per year. She was informed of the risk of over-replacement of vitamin D and agrees to not increase her dose unless he discusses this with [place] first. Amanda Murphy agrees to follow up  with our clinic in 2 weeks.  At risk for osteopenia and osteoporosis Amanda Murphy is at risk for osteopenia and osteoporosis due to her vitamin D deficiency. She was encouraged to take her vitamin D and follow her higher calcium diet and increase strengthening exercise to help strengthen her bones and decrease her risk of osteopenia and osteoporosis.  Iron Deficiency Anemia The diagnosis of Iron deficiency anemia was discussed with Amanda Murphy and was explained in detail. She was given suggestions of iron rich foods and she will continue iron supplement (patient takes this at home) and will follow up with our clinic in 2 weeks.  Obesity Amanda Murphy is currently in the action stage of change. As such, her goal is to continue with weight loss efforts She has agreed to follow the Category 3 plan Amanda Murphy has been instructed to work up to a goal of 150 minutes of combined cardio and strengthening exercise per week for weight loss and overall health benefits. We discussed the following Behavioral Modification Strategies today: increasing lean protein intake and decrease eating out  Amanda Murphy has agreed to follow up with our clinic in 2 weeks. She was informed of the importance of frequent follow up visits to maximize her success with intensive lifestyle modifications for her multiple health conditions.  I, Doreene Nest, am acting as transcriptionist for Lacy Duverney, PA-C  I have reviewed the above documentation for accuracy and completeness, and I agree with the above. -Lacy Duverney, PA-C  I have reviewed the above note and agree with the plan. -Dennard Nip, MD  OBESITY BEHAVIORAL INTERVENTION VISIT  Today's visit was # 19 out of 22.  Starting weight: 324 lbs Starting date: 05/19/16 Today's weight : 315 lbs Today's date: 04/07/2017 Total lbs lost to date: 9 (Patients must lose 7 lbs in the first 6  months to continue with counseling)   ASK: We discussed the diagnosis of obesity with Roxanne Mins today and Olin Hauser agreed to give [place] permission to discuss obesity behavioral modification therapy today.  ASSESS: Marin has the diagnosis of obesity and her BMI today is 54.04 Karyn is in the action stage of change   ADVISE: Roiza was educated on the multiple health risks of obesity as well as the benefit of weight loss to improve her health. She was advised of the need for long term treatment and the importance of lifestyle modifications.  AGREE: Multiple dietary modification options and treatment options were discussed and  Sanskriti agreed to follow the Category 3 plan We discussed the following Behavioral Modification Strategies today: increasing lean protein intake and decrease eating out

## 2017-04-26 ENCOUNTER — Ambulatory Visit (INDEPENDENT_AMBULATORY_CARE_PROVIDER_SITE_OTHER): Payer: 59 | Admitting: Physician Assistant

## 2017-04-26 VITALS — BP 122/79 | HR 93 | Temp 98.5°F | Ht 64.0 in | Wt 319.0 lb

## 2017-04-26 DIAGNOSIS — Z6841 Body Mass Index (BMI) 40.0 and over, adult: Secondary | ICD-10-CM | POA: Diagnosis not present

## 2017-04-26 DIAGNOSIS — R7303 Prediabetes: Secondary | ICD-10-CM | POA: Diagnosis not present

## 2017-04-26 DIAGNOSIS — D508 Other iron deficiency anemias: Secondary | ICD-10-CM

## 2017-04-26 DIAGNOSIS — Z9189 Other specified personal risk factors, not elsewhere classified: Secondary | ICD-10-CM

## 2017-04-26 MED ORDER — INSULIN PEN NEEDLE 32G X 4 MM MISC: 1.0000 | Freq: Every day | 0 refills | Status: DC

## 2017-04-26 MED ORDER — LIRAGLUTIDE -WEIGHT MANAGEMENT 18 MG/3ML ~~LOC~~ SOPN: 3.0000 mg | PEN_INJECTOR | Freq: Every morning | SUBCUTANEOUS | 0 refills | Status: DC

## 2017-04-26 MED FILL — UNIFINE PENTIPS 32GX5/32: 32G X 4 MM | 90 days supply | Qty: 100 | Fill #0

## 2017-04-26 MED FILL — SAXENDA 18 MG/3 ML PEN: 18 | 30 days supply | Qty: 15 | Fill #0

## 2017-04-26 MED FILL — UNIFINE PENTIPS 32GX5/32": 32G X 4 MM | 90 days supply | Qty: 100 | Fill #0

## 2017-04-26 NOTE — Progress Notes (Signed)
Office: [phone]  /  Fax: [phone]   HPI:   Chief Complaint: OBESITY Aeralyn is here to discuss her progress with her obesity treatment plan. She is on the Category 3 plan and is following her eating plan approximately 60 % of the time. She states she is exercising 0 minutes 0 times per week. Dariann has increase cravings for sweets and candy, especially after Halloween. She is working on her meal planning. Rickelle is motivated about weight loss and is ready to get back on track.  Her weight is (!) 319 lb (144.7 kg) today and has had a weight gain of 4 pounds over a period of 3 weeks since her last visit. She has lost 5 lbs since starting treatment with [place].  Pre-Diabetes Charliegh has a diagnosis of pre-diabetes based on her elevated Hgb A1c and was informed this puts her at greater risk of developing diabetes. She is not taking metformin currently and continues to work on diet and exercise to decrease her risk of diabetes. She denies nausea, polyphagia or hypoglycemia.  At risk for diabetes Chea is at higher than average risk for developing diabetes due to her obesity and pre-diabetes. She currently denies polyuria or polydipsia.  Iron Deficiency Anemia Alycea has a diagnosis of anemia. She is status post hysterectomy and she states vaginal bleeding has significantly improved. She has an appointment for her recheck in 2 weeks with her Doctor where she will have repeat Hgb recheck. She denies any dizziness, weakness, chest pain, dyspnea.   ALLERGIES: No Known Allergies  MEDICATIONS: Current Outpatient Medications on File Prior to Visit  Medication Sig Dispense Refill  . acetaminophen (TYLENOL) 500 MG tablet Take 1,000 mg by mouth every 6 (six) hours as needed for mild pain.    [name] aspirin EC 325 MG tablet Take 325 mg by mouth daily.    . calcium carbonate (TUMS - DOSED IN MG ELEMENTAL CALCIUM) 500 MG chewable tablet Chew 1 tablet by mouth as needed. Reported on 07/31/2015     . docusate sodium (COLACE) 100 MG capsule Take 100 mg by mouth daily as needed for mild constipation.    [name] levocetirizine (XYZAL) 5 MG tablet Take 1 tablet (5 mg total) by mouth every evening. (Patient taking differently: Take 5 mg by mouth daily as needed. ) 30 tablet 11  . levonorgestrel (MIRENA) 20 MCG/24HR IUD 1 each by Intrauterine route once.    . Melatonin 3 MG TABS Take by mouth.    . Prenatal Vit-Fe Fumarate-FA (PRENATAL MULTIVITAMIN) TABS tablet Take 1 tablet by mouth daily.     . Vitamin D, Ergocalciferol, (DRISDOL) 50000 units CAPS capsule Take 1 capsule (50,000 Units total) by mouth every 7 (seven) days. 4 capsule 0   No current facility-administered medications on file prior to visit.     PAST MEDICAL HISTORY: Past Medical History:  Diagnosis Date  . Abnormal uterine bleeding (AUB)   . Anemia   . Anxiety   . Asthma    as child - no inhaler - no problem as adult  . Astigmatism   . Fibroids   . H/O diabetes insipidus    s/p craniotomy  . Headache(784.0)    hx - none since surgery  . Hx of blood clots 2016   P. E.  p craniotomy. on Xarelto until 2017.  . Joint pain    ankles and hips  . PCOS (polycystic ovarian syndrome)   . Pneumonia    Hx 3-4 yrs ago   .  Pre-diabetes   . Seasonal allergies   . Vision abnormalities   . Vitamin D deficiency   . Wears glasses     PAST SURGICAL HISTORY: Past Surgical History:  Procedure Laterality Date  . CRANIOTOMY FOR RESECTION OF TUMOR N/A 10/31/2014   Performed by Jovita Gamma, MD at [facility] NEURO ORS  . DILATATION & CURETTAGE/HYSTEROSCOPY WITH MYOSURE N/A 11/12/2016   Performed by Marylynn Pearson, MD at [facility] ORS  . DILATATION & CURETTAGE/HYSTEROSCOPY WITH MYOSURE ABLATION N/A 02/07/2014   Performed by Marylynn Pearson, MD at [facility] ORS  . DILATION AND CURETTAGE OF UTERUS     myomectomy  . MYOMECTOMY     vaginal -   . WISDOM TOOTH EXTRACTION  2006    SOCIAL HISTORY: Social History   Tobacco Use  . Smoking status: Never  Smoker  . Smokeless tobacco: Never Used  Substance Use Topics  . Alcohol use: Yes    Alcohol/week: 0.0 oz    Comment: occasional wine  . Drug use: No    FAMILY HISTORY: Family History  Problem Relation Age of Onset  . Hypertension Mother   . Diabetes Mother   . Obesity Mother   . Ovarian cancer Unknown   . Breast cancer Unknown   . Fibroids Unknown     ROS: Review of Systems  Constitutional: Negative for weight loss.  Respiratory: Negative for shortness of breath.   Cardiovascular: Negative for chest pain.  Gastrointestinal: Negative for nausea.  Genitourinary: Negative for frequency.  Endo/Heme/Allergies: Negative for polydipsia.       Negative polyphagia Negative hypoglycemia    PHYSICAL EXAM: Blood pressure 122/79, pulse 93, temperature 98.5 F (36.9 C), temperature source Oral, height 5\' 4"  (1.626 m), weight (!) 319 lb (144.7 kg), SpO2 99 %. Body mass index is 54.76 kg/m. Physical Exam  Constitutional: She is oriented to person, place, and time. She appears well-developed and well-nourished.  Cardiovascular: Normal rate.  Pulmonary/Chest: Effort normal.  Musculoskeletal: Normal range of motion.  Neurological: She is oriented to person, place, and time.  Skin: Skin is warm and dry.  Psychiatric: She has a normal mood and affect. Her behavior is normal.  Vitals reviewed.   RECENT LABS AND TESTS: BMET    Component Value Date/Time   NA 140 01/11/2017 1205   K 4.5 01/11/2017 1205   CL 104 01/11/2017 1205   CO2 24 01/11/2017 1205   GLUCOSE 80 01/11/2017 1205   GLUCOSE 92 11/06/2016 1355   BUN 10 01/11/2017 1205   CREATININE 0.84 01/11/2017 1205   CALCIUM 8.6 (L) 01/11/2017 1205   GFRNONAA 89 01/11/2017 1205   GFRAA 103 01/11/2017 1205   Lab Results  Component Value Date   HGBA1C 5.5 01/11/2017   HGBA1C 5.6 09/01/2016   HGBA1C 5.8 (H) 05/19/2016   HGBA1C 6.1 09/27/2015   HGBA1C 5.8 09/19/2014   Lab Results  Component Value Date   INSULIN 52.3 (H)  01/11/2017   INSULIN 80.9 (H) 09/01/2016   CBC    Component Value Date/Time   WBC 7.9 01/11/2017 1205   WBC 10.9 (H) 11/06/2016 1355   RBC 3.59 (L) 01/11/2017 1205   RBC 3.95 11/06/2016 1355   HGB 8.9 (L) 01/11/2017 1205   HCT 29.4 (L) 01/11/2017 1205   PLT 516 (H) 01/11/2017 1205   MCV 82 01/11/2017 1205   MCH 24.8 (L) 01/11/2017 1205   MCH 25.8 (L) 11/06/2016 1355   MCHC 30.3 (L) 01/11/2017 1205   MCHC 31.2 11/06/2016 1355   RDW  20.3 (H) 01/11/2017 1205   LYMPHSABS 3.0 01/11/2017 1205   MONOABS 0.4 11/23/2014 1339   EOSABS 0.2 01/11/2017 1205   BASOSABS 0.0 01/11/2017 1205   Iron/TIBC/Ferritin/ %Sat    Component Value Date/Time   IRON 63 05/19/2016 1105   TIBC 343 05/19/2016 1105   FERRITIN 34 05/19/2016 1105   IRONPCTSAT 18 05/19/2016 1105   Lipid Panel     Component Value Date/Time   CHOL 172 09/01/2016 1231   TRIG 85 09/01/2016 1231   HDL 67 09/01/2016 1231   CHOLHDL 3 09/27/2015 1010   VLDL 21.6 09/27/2015 1010   LDLCALC 88 09/01/2016 1231   Hepatic Function Panel     Component Value Date/Time   PROT 6.7 01/11/2017 1205   ALBUMIN 3.7 01/11/2017 1205   AST 18 01/11/2017 1205   ALT 22 01/11/2017 1205   ALKPHOS 84 01/11/2017 1205   BILITOT 0.2 01/11/2017 1205      Component Value Date/Time   TSH 1.420 05/19/2016 1053   TSH 0.239 (L) 11/26/2014 1325    ASSESSMENT AND PLAN: Prediabetes  Other iron deficiency anemia  At risk for diabetes mellitus  Class 3 severe obesity with serious comorbidity and body mass index (BMI) of 50.0 to 59.9 in adult, unspecified obesity type ([place]) - Plan: Insulin Pen Needle (BD PEN NEEDLE NANO U/F) 32G X 4 MM MISC, Liraglutide -Weight Management (SAXENDA) 18 MG/3ML SOPN  PLAN:  Pre-Diabetes Harneet will continue to work on weight loss, exercise, and decreasing simple carbohydrates in her diet to help decrease the risk of diabetes. We dicussed metformin including benefits and risks. She was informed that eating too  many simple carbohydrates or too many calories at one sitting increases the likelihood of GI side effects. Jada declined metformin today and a prescription was not written today. Junella agreed to follow up with [place] as directed to monitor her progress.  Diabetes risk counseling Jennetta was given extended (15 minutes) diabetes prevention counseling today. She is 36 y.o. female and has risk factors for diabetes including obesity and pre-diabetes. We discussed intensive lifestyle modifications today with an emphasis on weight loss as well as increasing exercise and decreasing simple carbohydrates in her diet.  Iron Deficiency Anemia The diagnosis of Iron deficiency anemia was discussed with Veena and was explained in detail. She was given suggestions of iron rich foods and iron supplement was not prescribed. Rebeca will follow up with her doctor as planned.  Obesity Alexsandria is currently in the action stage of change. As such, her goal is to continue with weight loss efforts She has agreed to keep a food journal with 1400 to 1500 calories and 95 grams of protein daily Willona has been instructed to work up to a goal of 150 minutes of combined cardio and strengthening exercise per week for weight loss and overall health benefits. We discussed the following Behavioral Modification Strategies today: increasing lean protein intake and planning for success We discussed various medication options to help Helyne with her weight loss efforts and we both agreed to continue Saxenda, we will refill for 5 pens and nano needles  Alyviah has agreed to follow up with our clinic in 2 weeks. She was informed of the importance of frequent follow up visits to maximize her success with intensive lifestyle modifications for her multiple health conditions.  I, Doreene Nest, am acting as transcriptionist for Lacy Duverney, PA-C  I have reviewed the above documentation for accuracy and completeness, and I  agree with the above. -Sahar  Alene Mires, PA-C  I have reviewed the above note and agree with the plan. -Dennard Nip, MD   OBESITY BEHAVIORAL INTERVENTION VISIT  Today's visit was # 20 out of 22.  Starting weight: 324 lbs Starting date: 05/19/16 Today's weight : 319 lbs Today's date: 04/26/2017 Total lbs lost to date: 5 (Patients must lose 7 lbs in the first 6 months to continue with counseling)   ASK: We discussed the diagnosis of obesity with Roxanne Mins today and Olin Hauser agreed to give [place] permission to discuss obesity behavioral modification therapy today.  ASSESS: Zaraya has the diagnosis of obesity and her BMI today is 54.73 Alethea is in the action stage of change   ADVISE: Denecia was educated on the multiple health risks of obesity as well as the benefit of weight loss to improve her health. She was advised of the need for long term treatment and the importance of lifestyle modifications.  AGREE: Multiple dietary modification options and treatment options were discussed and  Deisha agreed to keep a food journal with 1400 to 1500 calories and 95 grams of protein daily We discussed the following Behavioral Modification Strategies today: increasing lean protein intake and planning for success

## 2017-04-27 MED FILL — VIT D2 1.25 MG (50,000 UNIT: 1.25 MG | 28 days supply | Qty: 4 | Fill #0

## 2017-05-04 DIAGNOSIS — Z01419 Encounter for gynecological examination (general) (routine) without abnormal findings: Secondary | ICD-10-CM | POA: Diagnosis not present

## 2017-05-04 DIAGNOSIS — Z6841 Body Mass Index (BMI) 40.0 and over, adult: Secondary | ICD-10-CM | POA: Diagnosis not present

## 2017-05-10 ENCOUNTER — Encounter (INDEPENDENT_AMBULATORY_CARE_PROVIDER_SITE_OTHER): Payer: Self-pay

## 2017-05-13 ENCOUNTER — Ambulatory Visit (INDEPENDENT_AMBULATORY_CARE_PROVIDER_SITE_OTHER): Payer: 59 | Admitting: Physician Assistant

## 2017-05-13 VITALS — BP 137/85 | HR 83 | Temp 97.7°F | Ht 64.0 in | Wt 319.0 lb

## 2017-05-13 DIAGNOSIS — R7303 Prediabetes: Secondary | ICD-10-CM | POA: Diagnosis not present

## 2017-05-13 DIAGNOSIS — E7849 Other hyperlipidemia: Secondary | ICD-10-CM

## 2017-05-13 DIAGNOSIS — D508 Other iron deficiency anemias: Secondary | ICD-10-CM | POA: Diagnosis not present

## 2017-05-13 DIAGNOSIS — Z9189 Other specified personal risk factors, not elsewhere classified: Secondary | ICD-10-CM

## 2017-05-13 DIAGNOSIS — Z6841 Body Mass Index (BMI) 40.0 and over, adult: Secondary | ICD-10-CM

## 2017-05-13 DIAGNOSIS — E559 Vitamin D deficiency, unspecified: Secondary | ICD-10-CM

## 2017-05-13 NOTE — Progress Notes (Signed)
Office: [phone]  /  Fax: [phone]   HPI:   Chief Complaint: OBESITY Amanda Murphy is here to discuss her progress with her obesity treatment plan. She is on the  keep a food journal with 1400 to 1500 calories and 95 grams of protein daily and is following her eating plan approximately 80 % of the time. She states she is exercising water aerobics, walking and weights for 60 minutes 3 times per week. Mirna maintained her weight. She continues to struggle with planning her meals ahead of time. Veera states she will try to keep healthy foods in the house. Her weight is (!) 319 lb (144.7 kg) today and has maintained her weight over a period of 2 weeks since her last visit. She has lost 5 lbs since starting treatment with [place]. Marjarie is currently on Saxenda.  Vitamin D deficiency Christon has a diagnosis of vitamin D deficiency. She is currently taking vit D and denies nausea, vomiting or muscle weakness.  Iron Deficiency Anemia Jaycelynn has a diagnosis of anemia and she is status post Dilation and Curettage surgery. She has had a history of heavy bleeding, but this has improved. She is currently on multivitamins with iron.  Hyperlipidemia Pama has hyperlipidemia. She is not on statin and declines medications today. Devyn has been trying to improve her cholesterol levels with intensive lifestyle modification including a low saturated fat diet, exercise and weight loss. She denies any chest pain, claudication or myalgias.  Pre-Diabetes Letisia has a diagnosis of pre-diabetes based on her elevated Hgb A1c and was informed this puts her at greater risk of developing diabetes. She is not taking metformin currently. Marene is currently on Saxenda and she continues to work on diet and exercise to decrease risk of diabetes. She denies nausea, polyphagia or hypoglycemia.  At risk for diabetes Brizeida is at higher than average risk for developing diabetes due to her obesity and  pre-diabetes. She currently denies polyuria or polydipsia.  ALLERGIES: No Known Allergies  MEDICATIONS: Current Outpatient Medications on File Prior to Visit  Medication Sig Dispense Refill  . acetaminophen (TYLENOL) 500 MG tablet Take 1,000 mg by mouth every 6 (six) hours as needed for mild pain.    [name] aspirin EC 325 MG tablet Take 325 mg by mouth daily.    . calcium carbonate (TUMS - DOSED IN MG ELEMENTAL CALCIUM) 500 MG chewable tablet Chew 1 tablet by mouth as needed. Reported on 07/31/2015    . docusate sodium (COLACE) 100 MG capsule Take 100 mg by mouth daily as needed for mild constipation.    . Insulin Pen Needle (BD PEN NEEDLE NANO U/F) 32G X 4 MM MISC 1 Package daily at 12 noon by Does not apply route. 100 each 0  . levocetirizine (XYZAL) 5 MG tablet Take 1 tablet (5 mg total) by mouth every evening. (Patient taking differently: Take 5 mg by mouth daily as needed. ) 30 tablet 11  . levonorgestrel (MIRENA) 20 MCG/24HR IUD 1 each by Intrauterine route once.    . Liraglutide -Weight Management (SAXENDA) 18 MG/3ML SOPN Inject 3 mg every morning into the skin. 5 pen 0  . Melatonin 3 MG TABS Take by mouth.    . Prenatal Vit-Fe Fumarate-FA (PRENATAL MULTIVITAMIN) TABS tablet Take 1 tablet by mouth daily.     . Vitamin D, Ergocalciferol, (DRISDOL) 50000 units CAPS capsule Take 1 capsule (50,000 Units total) by mouth every 7 (seven) days. 4 capsule 0   No current facility-administered medications on file  prior to visit.     PAST MEDICAL HISTORY: Past Medical History:  Diagnosis Date  . Abnormal uterine bleeding (AUB)   . Anemia   . Anxiety   . Asthma    as child - no inhaler - no problem as adult  . Astigmatism   . Fibroids   . H/O diabetes insipidus    s/p craniotomy  . Headache(784.0)    hx - none since surgery  . Hx of blood clots 2016   P. E.  p craniotomy. on Xarelto until 2017.  . Joint pain    ankles and hips  . PCOS (polycystic ovarian syndrome)   . Pneumonia    Hx  3-4 yrs ago   . Pre-diabetes   . Seasonal allergies   . Vision abnormalities   . Vitamin D deficiency   . Wears glasses     PAST SURGICAL HISTORY: Past Surgical History:  Procedure Laterality Date  . CRANIOTOMY N/A 10/31/2014   Procedure: CRANIOTOMY FOR RESECTION OF TUMOR;  Surgeon: Jovita Gamma, MD;  Location: [place] NEURO ORS;  Service: Neurosurgery;  Laterality: N/A;  Craniotomy for resection of tumor  . DILATATION & CURETTAGE/HYSTEROSCOPY WITH MYOSURE N/A 11/12/2016   Procedure: DILATATION & CURETTAGE/HYSTEROSCOPY WITH MYOSURE;  Surgeon: Marylynn Pearson, MD;  Location: [facility];  Service: Gynecology;  Laterality: N/A;  . DILATION AND CURETTAGE OF UTERUS     myomectomy  . MYOMECTOMY     vaginal -   . WISDOM TOOTH EXTRACTION  2006    SOCIAL HISTORY: Social History   Tobacco Use  . Smoking status: Never Smoker  . Smokeless tobacco: Never Used  Substance Use Topics  . Alcohol use: Yes    Alcohol/week: 0.0 oz    Comment: occasional wine  . Drug use: No    FAMILY HISTORY: Family History  Problem Relation Age of Onset  . Hypertension Mother   . Diabetes Mother   . Obesity Mother   . Ovarian cancer Unknown   . Breast cancer Unknown   . Fibroids Unknown     ROS: Review of Systems  Constitutional: Negative for weight loss.  Cardiovascular: Negative for chest pain and claudication.  Gastrointestinal: Negative for nausea and vomiting.  Genitourinary: Negative for frequency.  Musculoskeletal: Negative for myalgias.       Negative muscle weakness  Endo/Heme/Allergies: Negative for polydipsia.       Negative polyphagia Negative hypoglycemia    PHYSICAL EXAM: Blood pressure 137/85, pulse 83, temperature 97.7 F (36.5 C), temperature source Oral, height 5\' 4"  (1.626 m), weight (!) 319 lb (144.7 kg), SpO2 98 %. Body mass index is 54.76 kg/m. Physical Exam  Constitutional: She is oriented to person, place, and time. She appears well-developed and well-nourished.    Cardiovascular: Normal rate.  Pulmonary/Chest: Effort normal.  Musculoskeletal: Normal range of motion.  Neurological: She is oriented to person, place, and time.  Skin: Skin is warm and dry.  Psychiatric: She has a normal mood and affect. Her behavior is normal.  Vitals reviewed.   RECENT LABS AND TESTS: BMET    Component Value Date/Time   NA 140 01/11/2017 1205   K 4.5 01/11/2017 1205   CL 104 01/11/2017 1205   CO2 24 01/11/2017 1205   GLUCOSE 80 01/11/2017 1205   GLUCOSE 92 11/06/2016 1355   BUN 10 01/11/2017 1205   CREATININE 0.84 01/11/2017 1205   CALCIUM 8.6 (L) 01/11/2017 1205   GFRNONAA 89 01/11/2017 1205   GFRAA 103 01/11/2017 1205   Lab  Results  Component Value Date   HGBA1C 5.5 01/11/2017   HGBA1C 5.6 09/01/2016   HGBA1C 5.8 (H) 05/19/2016   HGBA1C 6.1 09/27/2015   HGBA1C 5.8 09/19/2014   Lab Results  Component Value Date   INSULIN 52.3 (H) 01/11/2017   INSULIN 80.9 (H) 09/01/2016   CBC    Component Value Date/Time   WBC 7.9 01/11/2017 1205   WBC 10.9 (H) 11/06/2016 1355   RBC 3.59 (L) 01/11/2017 1205   RBC 3.95 11/06/2016 1355   HGB 8.9 (L) 01/11/2017 1205   HCT 29.4 (L) 01/11/2017 1205   PLT 516 (H) 01/11/2017 1205   MCV 82 01/11/2017 1205   MCH 24.8 (L) 01/11/2017 1205   MCH 25.8 (L) 11/06/2016 1355   MCHC 30.3 (L) 01/11/2017 1205   MCHC 31.2 11/06/2016 1355   RDW 20.3 (H) 01/11/2017 1205   LYMPHSABS 3.0 01/11/2017 1205   MONOABS 0.4 11/23/2014 1339   EOSABS 0.2 01/11/2017 1205   BASOSABS 0.0 01/11/2017 1205   Iron/TIBC/Ferritin/ %Sat    Component Value Date/Time   IRON 63 05/19/2016 1105   TIBC 343 05/19/2016 1105   FERRITIN 34 05/19/2016 1105   IRONPCTSAT 18 05/19/2016 1105   Lipid Panel     Component Value Date/Time   CHOL 172 09/01/2016 1231   TRIG 85 09/01/2016 1231   HDL 67 09/01/2016 1231   CHOLHDL 3 09/27/2015 1010   VLDL 21.6 09/27/2015 1010   LDLCALC 88 09/01/2016 1231   Hepatic Function Panel     Component Value  Date/Time   PROT 6.7 01/11/2017 1205   ALBUMIN 3.7 01/11/2017 1205   AST 18 01/11/2017 1205   ALT 22 01/11/2017 1205   ALKPHOS 84 01/11/2017 1205   BILITOT 0.2 01/11/2017 1205      Component Value Date/Time   TSH 1.420 05/19/2016 1053   TSH 0.239 (L) 11/26/2014 1325    ASSESSMENT AND PLAN: Prediabetes - Plan: Comprehensive metabolic panel, Hemoglobin A1c, Insulin, random  Vitamin D deficiency - Plan: VITAMIN D 25 Hydroxy (Vit-D Deficiency, Fractures)  Other iron deficiency anemia - Plan: CBC With Differential  Other hyperlipidemia - Plan: Lipid Panel With LDL/HDL Ratio  At risk for diabetes mellitus  Class 3 severe obesity with serious comorbidity and body mass index (BMI) of 50.0 to 59.9 in adult, unspecified obesity type ([place])  PLAN:  Vitamin D Deficiency Payson was informed that low vitamin D levels contributes to fatigue and are associated with obesity, breast, and colon cancer. She agrees to continue to take prescription Vit D @50 ,000 IU every week. We will check labs and will follow up for routine testing of vitamin D, at least 2-3 times per year. She was informed of the risk of over-replacement of vitamin D and agrees to not increase her dose unless he discusses this with [place] first.   Iron Deficiency Anemia The diagnosis of Iron deficiency anemia was discussed with Magdalina and was explained in detail. She was given suggestions of iron rich foods. Lyndsi agrees to continue multivitamins with iron and will follow up with our clinic in 3 weeks.   Hyperlipidemia Elani was informed of the American Heart Association Guidelines emphasizing intensive lifestyle modifications as the first line treatment for hyperlipidemia. We discussed many lifestyle modifications today in depth, and Kinsie will continue to work on decreasing saturated fats such as fatty red meat, butter and many fried foods. She will also increase vegetables and lean protein in her diet and continue to work  on exercise and weight  loss efforts.  Pre-Diabetes Britini will continue to work on weight loss, exercise, and decreasing simple carbohydrates in her diet to help decrease the risk of diabetes.  She was informed that eating too many simple carbohydrates or too many calories at one sitting increases the likelihood of GI side effects. Alyzah agreed to follow up with [place] as directed to monitor her progress.  Diabetes risk counseling Mikalia was given extended (15 minutes) diabetes prevention counseling today. She is 36 y.o. female and has risk factors for diabetes including obesity and pre-diabetes. We discussed intensive lifestyle modifications today with an emphasis on weight loss as well as increasing exercise and decreasing simple carbohydrates in her diet.  Obesity Anais is currently in the action stage of change. As such, her goal is to continue with weight loss efforts She has agreed to keep a food journal with 140 to 1500 calories and 95 grams of protein daily Sera has been instructed to work up to a goal of 150 minutes of combined cardio and strengthening exercise per week for weight loss and overall health benefits. We discussed the following Behavioral Modification Strategies today: planning for success, increasing lean protein intake and keeping healthy foods in the home  Renalda has agreed to follow up with our clinic in 3 weeks. She was informed of the importance of frequent follow up visits to maximize her success with intensive lifestyle modifications for her multiple health conditions.  I, Doreene Nest, am acting as transcriptionist for Lacy Duverney, PA-C  I have reviewed the above documentation for accuracy and completeness, and I agree with the above. -Lacy Duverney, PA-C  I have reviewed the above note and agree with the plan. -Dennard Nip, MD   OBESITY BEHAVIORAL INTERVENTION VISIT  Today's visit was # 21 out of 22.  Starting weight: 324 lbs Starting date:  05/19/16 Today's weight : 319 lbs Today's date: 05/13/2017 Total lbs lost to date: 5 (Patients must lose 7 lbs in the first 6 months to continue with counseling)   ASK: We discussed the diagnosis of obesity with Roxanne Mins today and Olin Hauser agreed to give [place] permission to discuss obesity behavioral modification therapy today.  ASSESS: Omara has the diagnosis of obesity and her BMI today is 54.73 Taniya is in the action stage of change   ADVISE: Jhoselyn was educated on the multiple health risks of obesity as well as the benefit of weight loss to improve her health. She was advised of the need for long term treatment and the importance of lifestyle modifications.  AGREE: Multiple dietary modification options and treatment options were discussed and  Allaya agreed to keep a food journal with 1400 to 1500 calories and 95 grams of protein daily We discussed the following Behavioral Modification Strategies today: planning for success,  increasing lean protein intake and keeping healthy foods in the home

## 2017-05-14 LAB — LIPID PANEL WITH LDL/HDL RATIO
CHOLESTEROL TOTAL: 158 mg/dL (ref 100–199)
HDL: 65 mg/dL (ref >39)
LDL Calculated: 79 mg/dL (ref 0–99)
LDl/HDL Ratio: 1.2 ratio (ref 0.0–3.2)
TRIGLYCERIDES: 70 mg/dL (ref 0–149)
VLDL Cholesterol Cal: 14 mg/dL (ref 5–40)

## 2017-05-14 LAB — COMPREHENSIVE METABOLIC PANEL
A/G RATIO: 1.2 (ref 1.2–2.2)
ALBUMIN: 3.6 g/dL (ref 3.5–5.5)
ALK PHOS: 80 IU/L (ref 39–117)
ALT: 23 IU/L (ref 0–32)
AST: 20 IU/L (ref 0–40)
BUN / CREAT RATIO: 8 — AB (ref 9–23)
BUN: 8 mg/dL (ref 6–20)
Bilirubin Total: <0.2 mg/dL (ref 0.0–1.2)
CALCIUM: 9.3 mg/dL (ref 8.7–10.2)
CO2: 26 mmol/L (ref 20–29)
CREATININE: 0.98 mg/dL (ref 0.57–1.00)
Chloride: 102 mmol/L (ref 96–106)
GFR calc Af Amer: 85 mL/min/{1.73_m2} (ref >59)
GFR, EST NON AFRICAN AMERICAN: 74 mL/min/{1.73_m2} (ref >59)
GLOBULIN, TOTAL: 3.1 g/dL (ref 1.5–4.5)
Glucose: 90 mg/dL (ref 65–99)
POTASSIUM: 4.5 mmol/L (ref 3.5–5.2)
SODIUM: 141 mmol/L (ref 134–144)
Total Protein: 6.7 g/dL (ref 6.0–8.5)

## 2017-05-14 LAB — CBC WITH DIFFERENTIAL
BASOS: 0 %
Basophils Absolute: 0 10*3/uL (ref 0.0–0.2)
EOS (ABSOLUTE): 0.4 10*3/uL (ref 0.0–0.4)
Eos: 5 %
Hematocrit: 36.9 % (ref 34.0–46.6)
Hemoglobin: 11.3 g/dL (ref 11.1–15.9)
Immature Grans (Abs): 0 10*3/uL (ref 0.0–0.1)
Immature Granulocytes: 0 %
Lymphocytes Absolute: 3.4 10*3/uL — ABNORMAL HIGH (ref 0.7–3.1)
Lymphs: 42 %
MCH: 25.8 pg — AB (ref 26.6–33.0)
MCHC: 30.6 g/dL — ABNORMAL LOW (ref 31.5–35.7)
MCV: 84 fL (ref 79–97)
MONOS ABS: 0.5 10*3/uL (ref 0.1–0.9)
Monocytes: 7 %
NEUTROS ABS: 3.6 10*3/uL (ref 1.4–7.0)
NEUTROS PCT: 46 %
RBC: 4.38 x10E6/uL (ref 3.77–5.28)
RDW: 19.4 % — AB (ref 12.3–15.4)
WBC: 7.9 10*3/uL (ref 3.4–10.8)

## 2017-05-14 LAB — VITAMIN D 25 HYDROXY (VIT D DEFICIENCY, FRACTURES): Vit D, 25-Hydroxy: 29.8 ng/mL — ABNORMAL LOW (ref 30.0–100.0)

## 2017-05-14 LAB — INSULIN, RANDOM: INSULIN: 49.5 u[IU]/mL — AB (ref 2.6–24.9)

## 2017-05-14 LAB — HEMOGLOBIN A1C
Est. average glucose Bld gHb Est-mCnc: 123 mg/dL
Hgb A1c MFr Bld: 5.9 % — ABNORMAL HIGH (ref 4.8–5.6)

## 2017-05-19 ENCOUNTER — Ambulatory Visit (INDEPENDENT_AMBULATORY_CARE_PROVIDER_SITE_OTHER): Payer: 59 | Admitting: Dietician

## 2017-05-20 ENCOUNTER — Ambulatory Visit (INDEPENDENT_AMBULATORY_CARE_PROVIDER_SITE_OTHER): Payer: Self-pay | Admitting: Dietician

## 2017-05-24 ENCOUNTER — Ambulatory Visit (INDEPENDENT_AMBULATORY_CARE_PROVIDER_SITE_OTHER): Payer: 59 | Admitting: Dietician

## 2017-05-24 VITALS — Ht 64.0 in | Wt 325.0 lb

## 2017-05-24 DIAGNOSIS — Z9189 Other specified personal risk factors, not elsewhere classified: Secondary | ICD-10-CM

## 2017-05-24 DIAGNOSIS — Z6841 Body Mass Index (BMI) 40.0 and over, adult: Secondary | ICD-10-CM

## 2017-05-24 DIAGNOSIS — R7303 Prediabetes: Secondary | ICD-10-CM | POA: Diagnosis not present

## 2017-05-24 NOTE — Progress Notes (Signed)
  Office: [phone]  /  Fax: [phone]     Amanda Murphy has a diagnosis of prediabetes based on her elevated HgA1c and was informed this puts her at greater risk of developing diabetes. She is here for diabetes risk nutrition counseling.   Amanda Murphy's weight today is 325 lbs, a 6 lb weight gain since her last visit. She has had a 0 lb wt loss since beginning treatment with [place]. Amanda Murphy is on a journaling meal plan, 1400-1500 calories and 95+ grams of protein. She states she has followed approximately 50% of the time. Amanda Murphy reports that over the past couple of weeks she has been staying with friends and her snacking has increased. She states she has been eating more processed carbohydrates such as chips and crackers and more simple carbohydrates. Reviewed food nutrients ie protein, fats, simple and complex carbohydrates and how these affect insulin response. Focus on portion control,  avoiding simple carbohydrates and increasing lean protein for ongoing wt loss efforts and glucose management. Amanda Murphy states that when she gets busy she forgets to journal her intake. She continues on Saxenda and reports her hunger is manageable. She also reports she has discontinued taking the Wellbutrin. Emotional eating and boredom eating strategies were also discussed. Amanda Murphy agreed to our low carbohydrate meal plan for the next 2 weeks. Meal plan was reviewed in detail.   Amanda Murphy is on the following meal plan: Low carbohydrate  Her meal plan was individualized for maximum benefit.  Also discussed at length the following behavioral modifications to help maximize success: increasing lean protein intake,  ways to avoid boredom eating,  emotional eating strategies, avoiding temptations.   Amanda Murphy has been instructed to work up to a goal of 150 minutes of combined cardio and strengthening exercise per week for weight loss and overall health benefits. Written information was provided and the following handouts were  given:  low carbohydrate high protein meal plan.   Office: [phone]  /  Fax: [phone]  OBESITY BEHAVIORAL INTERVENTION VISIT  Today's visit was # 22 out of 22.  Starting weight: 324 Starting date: 05/19/17 Today's weight : Weight: (!) 325 lb (147.4 kg)  Today's date: 05/24/2017 Total lbs lost to date: 0 (Patients must lose 7 lbs in the first 6 months to continue with counseling)   ASK: We discussed the diagnosis of obesity with Amanda Murphy today and Amanda Murphy agreed to give [place] permission to discuss obesity behavioral modification therapy today.  ASSESS: Amanda Murphy has the diagnosis of obesity and her BMI today is 55.76 Amanda Murphy is in the action stage of change   ADVISE: Amanda Murphy was educated on the multiple health risks of obesity as well as the benefit of weight loss to improve her health. She was advised of the need for long term treatment and the importance of lifestyle modifications.  AGREE: Multiple dietary modification options and treatment options were discussed and  Amanda Murphy agreed to follow a lower carbohydrate, vegetable and lean protein rich diet plan We discussed the following Behavioral Modification Stratagies today: increasing lean protein intake, increasing vegetables, emotional eating strategies and ways to avoid boredom eating

## 2017-06-02 ENCOUNTER — Ambulatory Visit (INDEPENDENT_AMBULATORY_CARE_PROVIDER_SITE_OTHER): Payer: 59 | Admitting: Physician Assistant

## 2017-06-02 VITALS — BP 136/82 | HR 97 | Temp 98.0°F | Ht 64.0 in | Wt 323.0 lb

## 2017-06-02 DIAGNOSIS — Z6841 Body Mass Index (BMI) 40.0 and over, adult: Secondary | ICD-10-CM | POA: Diagnosis not present

## 2017-06-02 DIAGNOSIS — F3289 Other specified depressive episodes: Secondary | ICD-10-CM | POA: Diagnosis not present

## 2017-06-02 DIAGNOSIS — E559 Vitamin D deficiency, unspecified: Secondary | ICD-10-CM | POA: Diagnosis not present

## 2017-06-02 DIAGNOSIS — Z9189 Other specified personal risk factors, not elsewhere classified: Secondary | ICD-10-CM | POA: Diagnosis not present

## 2017-06-02 MED ORDER — EXENATIDE ER 2 MG ~~LOC~~ PEN: 2.0000 mg | PEN_INJECTOR | SUBCUTANEOUS | 0 refills | Status: DC

## 2017-06-02 MED ORDER — VITAMIN D (ERGOCALCIFEROL) 1.25 MG (50000 UNIT) PO CAPS: 50000.0000 [IU] | ORAL_CAPSULE | ORAL | 0 refills | Status: DC

## 2017-06-02 MED FILL — VIT D2 1.25 MG (50,000 UNIT: 1.25 MG | 28 days supply | Qty: 4 | Fill #0

## 2017-06-02 NOTE — Progress Notes (Signed)
Office: [phone]  /  Fax: [phone]   HPI:   Chief Complaint: OBESITY Amanda Murphy is here to discuss her progress with her obesity treatment plan. She is on the keep a food journal with 1400-1500 calories and 95 grams of protein daily and is following her eating plan approximately 10 % of the time. She states she is walking and doing water aerobics for 60 minutes 3 times per week. [name] continues to do well with weight loss. She is mindful of her eating and controls her portions. She has been working more on her meal planning and has been able to incorporate more variety to her meals. Her weight is (!) 323 lb (146.5 kg) today and has had a weight loss of 2 pounds over a period of 3 weeks since her last visit. She has lost 1 lbs since starting treatment with [place].  Vitamin D deficiency Amanda Murphy has a diagnosis of vitamin D deficiency. She is currently taking prescription Vit D, but not yet at goal, she has not been taking it. She denies nausea, vomiting or muscle weakness.  At risk for osteopenia and osteoporosis Amanda Murphy is at higher risk of osteopenia and osteoporosis due to vitamin D deficiency.   Depression with emotional eating behaviors Amanda Murphy is struggling with emotional eating and using food for comfort to the extent that it is negatively impacting her health. She often snacks when she is not hungry. Amanda Murphy sometimes feels she is out of control and then feels guilty that she made poor food choices. She has been working on behavior modification techniques to help reduce her emotional eating and has been somewhat successful. Her mood is stable and she shows no sign of suicidal or homicidal ideations.  Depression screen PHQ 2/9 05/19/2016  Decreased Interest 2  Down, Depressed, Hopeless 1  PHQ - 2 Score 3  Altered sleeping 1  Tired, decreased energy 3  Change in appetite 3  Feeling bad or failure about yourself  2  Trouble concentrating 1  Moving slowly or  fidgety/restless 2  Suicidal thoughts 0  PHQ-9 Score 15   ALLERGIES: No Known Allergies  MEDICATIONS: Current Outpatient Medications on File Prior to Visit  Medication Sig Dispense Refill  . acetaminophen (TYLENOL) 500 MG tablet Take 1,000 mg by mouth every 6 (six) hours as needed for mild pain.    [name] aspirin EC 325 MG tablet Take 325 mg by mouth daily.    . calcium carbonate (TUMS - DOSED IN MG ELEMENTAL CALCIUM) 500 MG chewable tablet Chew 1 tablet by mouth as needed. Reported on 07/31/2015    . docusate sodium (COLACE) 100 MG capsule Take 100 mg by mouth daily as needed for mild constipation.    . Insulin Pen Needle (BD PEN NEEDLE NANO U/F) 32G X 4 MM MISC 1 Package daily at 12 noon by Does not apply route. 100 each 0  . levocetirizine (XYZAL) 5 MG tablet Take 1 tablet (5 mg total) by mouth every evening. (Patient taking differently: Take 5 mg by mouth daily as needed. ) 30 tablet 11  . levonorgestrel (MIRENA) 20 MCG/24HR IUD 1 each by Intrauterine route once.    . Liraglutide -Weight Management (SAXENDA) 18 MG/3ML SOPN Inject 3 mg every morning into the skin. 5 pen 0  . Melatonin 3 MG TABS Take by mouth.    . Prenatal Vit-Fe Fumarate-FA (PRENATAL MULTIVITAMIN) TABS tablet Take 1 tablet by mouth daily.      No current facility-administered medications on file prior to visit.  PAST MEDICAL HISTORY: Past Medical History:  Diagnosis Date  . Abnormal uterine bleeding (AUB)   . Anemia   . Anxiety   . Asthma    as child - no inhaler - no problem as adult  . Astigmatism   . Fibroids   . H/O diabetes insipidus    s/p craniotomy  . Headache(784.0)    hx - none since surgery  . Hx of blood clots 2016   P. E.  p craniotomy. on Xarelto until 2017.  . Joint pain    ankles and hips  . PCOS (polycystic ovarian syndrome)   . Pneumonia    Hx 3-4 yrs ago   . Pre-diabetes   . Seasonal allergies   . Vision abnormalities   . Vitamin D deficiency   . Wears glasses     PAST SURGICAL  HISTORY: Past Surgical History:  Procedure Laterality Date  . CRANIOTOMY N/A 10/31/2014   Procedure: CRANIOTOMY FOR RESECTION OF TUMOR;  Surgeon: Jovita Gamma, MD;  Location: [facility];  Service: Neurosurgery;  Laterality: N/A;  Craniotomy for resection of tumor  . DILATATION & CURETTAGE/HYSTEROSCOPY WITH MYOSURE N/A 11/12/2016   Procedure: DILATATION & CURETTAGE/HYSTEROSCOPY WITH MYOSURE;  Surgeon: Marylynn Pearson, MD;  Location: [facility];  Service: Gynecology;  Laterality: N/A;  . DILATION AND CURETTAGE OF UTERUS     myomectomy  . MYOMECTOMY     vaginal -   . WISDOM TOOTH EXTRACTION  2006    SOCIAL HISTORY: Social History   Tobacco Use  . Smoking status: Never Smoker  . Smokeless tobacco: Never Used  Substance Use Topics  . Alcohol use: Yes    Alcohol/week: 0.0 oz    Comment: occasional wine  . Drug use: No    FAMILY HISTORY: Family History  Problem Relation Age of Onset  . Hypertension Mother   . Diabetes Mother   . Obesity Mother   . Ovarian cancer Unknown   . Breast cancer Unknown   . Fibroids Unknown     ROS: Review of Systems  Constitutional: Positive for weight loss.  Gastrointestinal: Negative for nausea and vomiting.  Musculoskeletal:       Negative muscle weakness  Psychiatric/Behavioral: Positive for depression. Negative for suicidal ideas.    PHYSICAL EXAM: Blood pressure 136/82, pulse 97, temperature 98 F (36.7 C), temperature source Oral, height 5\' 4"  (1.626 m), weight (!) 323 lb (146.5 kg), SpO2 98 %. Body mass index is 55.44 kg/m. Physical Exam  Constitutional: She is oriented to person, place, and time. She appears well-developed and well-nourished.  Cardiovascular: Normal rate.  Pulmonary/Chest: Effort normal.  Musculoskeletal: Normal range of motion.  Neurological: She is oriented to person, place, and time.  Skin: Skin is warm and dry.  Psychiatric: She has a normal mood and affect.  Vitals reviewed.   RECENT LABS AND  TESTS: BMET    Component Value Date/Time   NA 141 05/13/2017 0933   K 4.5 05/13/2017 0933   CL 102 05/13/2017 0933   CO2 26 05/13/2017 0933   GLUCOSE 90 05/13/2017 0933   GLUCOSE 92 11/06/2016 1355   BUN 8 05/13/2017 0933   CREATININE 0.98 05/13/2017 0933   CALCIUM 9.3 05/13/2017 0933   GFRNONAA 74 05/13/2017 0933   GFRAA 85 05/13/2017 0933   Lab Results  Component Value Date   HGBA1C 5.9 (H) 05/13/2017   HGBA1C 5.5 01/11/2017   HGBA1C 5.6 09/01/2016   HGBA1C 5.8 (H) 05/19/2016   HGBA1C 6.1 09/27/2015   Lab  Results  Component Value Date   INSULIN 49.5 (H) 05/13/2017   INSULIN 52.3 (H) 01/11/2017   INSULIN 80.9 (H) 09/01/2016   CBC    Component Value Date/Time   WBC 7.9 05/13/2017 0933   WBC 10.9 (H) 11/06/2016 1355   RBC 4.38 05/13/2017 0933   RBC 3.95 11/06/2016 1355   HGB 11.3 05/13/2017 0933   HCT 36.9 05/13/2017 0933   PLT 516 (H) 01/11/2017 1205   MCV 84 05/13/2017 0933   MCH 25.8 (L) 05/13/2017 0933   MCH 25.8 (L) 11/06/2016 1355   MCHC 30.6 (L) 05/13/2017 0933   MCHC 31.2 11/06/2016 1355   RDW 19.4 (H) 05/13/2017 0933   LYMPHSABS 3.4 (H) 05/13/2017 0933   MONOABS 0.4 11/23/2014 1339   EOSABS 0.4 05/13/2017 0933   BASOSABS 0.0 05/13/2017 0933   Iron/TIBC/Ferritin/ %Sat    Component Value Date/Time   IRON 63 05/19/2016 1105   TIBC 343 05/19/2016 1105   FERRITIN 34 05/19/2016 1105   IRONPCTSAT 18 05/19/2016 1105   Lipid Panel     Component Value Date/Time   CHOL 158 05/13/2017 0933   TRIG 70 05/13/2017 0933   HDL 65 05/13/2017 0933   CHOLHDL 3 09/27/2015 1010   VLDL 21.6 09/27/2015 1010   LDLCALC 79 05/13/2017 0933   Hepatic Function Panel     Component Value Date/Time   PROT 6.7 05/13/2017 0933   ALBUMIN 3.6 05/13/2017 0933   AST 20 05/13/2017 0933   ALT 23 05/13/2017 0933   ALKPHOS 80 05/13/2017 0933   BILITOT <0.2 05/13/2017 0933      Component Value Date/Time   TSH 1.420 05/19/2016 1053   TSH 0.239 (L) 11/26/2014 1325     ASSESSMENT AND PLAN: Vitamin D deficiency - Plan: Vitamin D, Ergocalciferol, (DRISDOL) 50000 units CAPS capsule  Other depression - with emotional eating - Plan: Exenatide ER (BYDUREON) 2 MG PEN  At risk for osteoporosis  Class 3 severe obesity with serious comorbidity and body mass index (BMI) of 50.0 to 59.9 in adult, unspecified obesity type ([place])  PLAN:  Vitamin D Deficiency Prescious was informed that low vitamin D levels contributes to fatigue and are associated with obesity, breast, and colon cancer. Demira agrees to continue taking prescription Vit D @50 ,000 IU every week #4 and we will refill for 1 month. She will follow up for routine testing of vitamin D, at least 2-3 times per year. She was informed of the risk of over-replacement of vitamin D and agrees to not increase her dose unless he discusses this with [place] first. Chalee agrees to follow up with our clinic in 2 to 3 weeks.  At risk for osteopenia and osteoporosis Chantilly is at risk for osteopenia and osteoporsis due to her vitamin D deficiency. She was encouraged to take her vitamin D and follow her higher calcium diet and increase strengthening exercise to help strengthen her bones and decrease her risk of osteopenia and osteoporosis.  Depression with Emotional Eating Behaviors We discussed behavior modification techniques today to help Laraya deal with her emotional eating and depression. Kennedie agrees to start Bydureon 2 mg pen once weekly. Shakeria agrees to follow up with our clinic in 2 to 3 weeks.  Obesity Cloria is currently in the action stage of change. As such, her goal is to continue with weight loss efforts She has agreed to keep a food journal with 1400-1500 calories and 95 grams of protein daily Versia has been instructed to work up to a goal  of 150 minutes of combined cardio and strengthening exercise per week for weight loss and overall health benefits. We discussed the following Behavioral  Modification Strategies today: increasing lean protein intake and planning for success We discussed various medication options to help Cady with her weight loss efforts and we both agreed to discontinue Saxenda.  Micky has agreed to follow up with our clinic in 2 to 3 weeks. She was informed of the importance of frequent follow up visits to maximize her success with intensive lifestyle modifications for her multiple health conditions.  I, Trixie Dredge, am acting as transcriptionist for Lacy Duverney, PA-C  I have reviewed the above documentation for accuracy and completeness, and I agree with the above. -Lacy Duverney, PA-C  I have reviewed the above note and agree with the plan. -Dennard Nip, MD     Today's visit was # 22 out of 22.  Starting weight: 324 lbs Starting date: 05/19/16 Today's weight : 323 lbs  Today's date: 06/02/2017 Total lbs lost to date: 1 (Patients must lose 7 lbs in the first 6 months to continue with counseling)   ASK: We discussed the diagnosis of obesity with Roxanne Mins today and Olin Hauser agreed to give [place] permission to discuss obesity behavioral modification therapy today.  ASSESS: Jasalyn has the diagnosis of obesity and her BMI today is 55.42 Anavey is in the action stage of change   ADVISE: Mersades was educated on the multiple health risks of obesity as well as the benefit of weight loss to improve her health. She was advised of the need for long term treatment and the importance of lifestyle modifications.  AGREE: Multiple dietary modification options and treatment options were discussed and  Geralda agreed to keep a food journal with 1400-1500 calories and 95 grams of protein daily We discussed the following Behavioral Modification Strategies today: increasing lean protein intake and planning for success

## 2017-06-05 DIAGNOSIS — H5213 Myopia, bilateral: Secondary | ICD-10-CM | POA: Diagnosis not present

## 2017-06-24 ENCOUNTER — Ambulatory Visit (INDEPENDENT_AMBULATORY_CARE_PROVIDER_SITE_OTHER): Payer: 59 | Admitting: Physician Assistant

## 2017-06-24 ENCOUNTER — Encounter (INDEPENDENT_AMBULATORY_CARE_PROVIDER_SITE_OTHER): Payer: Self-pay

## 2017-07-02 ENCOUNTER — Ambulatory Visit (INDEPENDENT_AMBULATORY_CARE_PROVIDER_SITE_OTHER): Payer: 59 | Admitting: Internal Medicine

## 2017-07-02 ENCOUNTER — Encounter: Payer: Self-pay | Admitting: Internal Medicine

## 2017-07-02 DIAGNOSIS — R220 Localized swelling, mass and lump, head: Secondary | ICD-10-CM | POA: Diagnosis not present

## 2017-07-02 DIAGNOSIS — G9389 Other specified disorders of brain: Secondary | ICD-10-CM

## 2017-07-02 DIAGNOSIS — Z0000 Encounter for general adult medical examination without abnormal findings: Secondary | ICD-10-CM | POA: Diagnosis not present

## 2017-07-02 NOTE — Progress Notes (Signed)
   Subjective:    Patient ID: Amanda Murphy, female    DOB: [birth year], 37 y.o.   MRN: [id number]  HPI The patient is a 37 YO female coming in for physical. [facility] nov 2018.[name]  PMH, [facility], social history reviewed and updated.   Review of Systems  Constitutional: Negative.   HENT: Negative.   Eyes: Negative.   Respiratory: Negative for cough, chest tightness and shortness of breath.   Cardiovascular: Negative for chest pain, palpitations and leg swelling.  Gastrointestinal: Negative for abdominal distention, abdominal pain, constipation, diarrhea, nausea and vomiting.  Musculoskeletal: Negative.   Skin: Negative.   Neurological: Negative.   Psychiatric/Behavioral: Negative.       Objective:   Physical Exam  Constitutional: She is oriented to person, place, and time. She appears well-developed and well-nourished.  Overweight  HENT:  Head: Normocephalic and atraumatic.  Eyes: EOM are normal.  Neck: Normal range of motion.  Cardiovascular: Normal rate and regular rhythm.  Pulmonary/Chest: Effort normal and breath sounds normal. No respiratory distress. She has no wheezes. She has no rales.  Abdominal: Soft. Bowel sounds are normal. She exhibits no distension. There is no tenderness. There is no rebound.  Musculoskeletal: She exhibits no edema.  Neurological: She is alert and oriented to person, place, and time. Coordination normal.  Skin: Skin is warm and dry.  Psychiatric: She has a normal mood and affect.   Vitals:   07/02/17 1000  BP: 120/90  Pulse: 86  Temp: 98.1 F (36.7 C)  TempSrc: Oral  SpO2: 100%  Weight: (!) 330 lb (149.7 kg)  Height: 5\' 4"  (1.626 m)      Assessment & Plan:

## 2017-07-02 NOTE — Assessment & Plan Note (Signed)
Working on weight loss, her work schedule is causing some struggles with her weight more. Will let her weight provider know that she was not able to get bydureon due to problems with the pharmacy to see if they can follow up with that.

## 2017-07-02 NOTE — Assessment & Plan Note (Signed)
No recurrent symptoms and no indication for imaging today.

## 2017-07-02 NOTE — Patient Instructions (Signed)

## 2017-07-02 NOTE — Assessment & Plan Note (Signed)
Pap up to date with gyn. Declines HIV screening. Flu and tetanus up to date. Counseled about diet and exercise as well as the dangers of distracted driving. Given screening recommendations.

## 2017-08-24 ENCOUNTER — Ambulatory Visit (INDEPENDENT_AMBULATORY_CARE_PROVIDER_SITE_OTHER): Payer: 59 | Admitting: Physician Assistant

## 2017-08-24 VITALS — BP 121/81 | HR 83 | Temp 98.4°F | Ht 64.0 in | Wt 324.0 lb

## 2017-08-24 DIAGNOSIS — F3289 Other specified depressive episodes: Secondary | ICD-10-CM

## 2017-08-24 DIAGNOSIS — Z6841 Body Mass Index (BMI) 40.0 and over, adult: Secondary | ICD-10-CM | POA: Diagnosis not present

## 2017-08-24 DIAGNOSIS — E559 Vitamin D deficiency, unspecified: Secondary | ICD-10-CM

## 2017-08-24 DIAGNOSIS — Z9189 Other specified personal risk factors, not elsewhere classified: Secondary | ICD-10-CM | POA: Diagnosis not present

## 2017-08-24 MED ORDER — VITAMIN D (ERGOCALCIFEROL) 1.25 MG (50000 UNIT) PO CAPS: 50000.0000 [IU] | ORAL_CAPSULE | ORAL | 0 refills | Status: DC

## 2017-08-24 MED FILL — VIT D2 1.25 MG (50,000 UNIT: 1.25 MG | 28 days supply | Qty: 4 | Fill #0

## 2017-08-24 NOTE — Progress Notes (Signed)
Office: [phone]  /  Fax: [phone]   HPI:   Chief Complaint: OBESITY Amanda Murphy is here to discuss her progress with her obesity treatment plan. She is on the keep a food journal with 1400-1500 calories and 95 grams of protein daily and is following her eating plan approximately 50 % of the time. She states she is walking for 30-45 minutes 3 times per week. Amanda Murphy has not been following up with [place], last appointment was 06/02/17. She states she has not been as mindful of her eating and has not been following the meal plan. She is motivated to get back on track and start weight loss.  Her weight is (!) 324 lb (147 kg) today and has gained 1 pound since her last visit. She has lost 0 lbs since starting treatment with [place].  Vitamin D Deficiency Amanda Murphy has a diagnosis of vitamin D deficiency. She is currently taking prescription Vit D and denies nausea, vomiting or muscle weakness.  At risk for osteopenia and osteoporosis Amanda Murphy is at higher risk of osteopenia and osteoporosis due to vitamin D deficiency.   Depression with emotional eating behaviors Amanda Murphy states she struggled with cravings after stopping Wellbutrin. She declines continuing on Wellbutrin. Amanda Murphy struggles with emotional eating and using food for comfort to the extent that it is negatively impacting her health. She often snacks when she is not hungry. Amanda Murphy sometimes feels she is out of control and then feels guilty that she made poor food choices. She has been working on behavior modification techniques to help reduce her emotional eating and has been somewhat successful. Her mood is stable and she shows no sign of suicidal or homicidal ideations.  Depression screen [facility] 2/9 07/02/2017 05/19/2016  Decreased Interest 0 2  Down, Depressed, Hopeless 0 1  PHQ - 2 Score 0 3  Altered sleeping 2 1  Tired, decreased energy 1 3  Change in appetite 0 3  Feeling bad or failure about yourself  0 2  Trouble concentrating  0 1  Moving slowly or fidgety/restless 0 2  Suicidal thoughts 0 0  PHQ-9 Score 3 15   ALLERGIES: No Known Allergies  MEDICATIONS: Current Outpatient Medications on File Prior to Visit  Medication Sig Dispense Refill  . acetaminophen (TYLENOL) 500 MG tablet Take 1,000 mg by mouth every 6 (six) hours as needed for mild pain.    [name] aspirin EC 325 MG tablet Take 325 mg by mouth daily.    . calcium carbonate (TUMS - DOSED IN MG ELEMENTAL CALCIUM) 500 MG chewable tablet Chew 1 tablet by mouth as needed. Reported on 07/31/2015    . docusate sodium (COLACE) 100 MG capsule Take 100 mg by mouth daily as needed for mild constipation.    . Insulin Pen Needle (BD PEN NEEDLE NANO U/F) 32G X 4 MM MISC 1 Package daily at 12 noon by Does not apply route. 100 each 0  . levocetirizine (XYZAL) 5 MG tablet Take 1 tablet (5 mg total) by mouth every evening. (Patient taking differently: Take 5 mg by mouth daily as needed. ) 30 tablet 11  . levonorgestrel (MIRENA) 20 MCG/24HR IUD 1 each by Intrauterine route once.    . Melatonin 3 MG TABS Take by mouth.    . Prenatal Vit-Fe Fumarate-FA (PRENATAL MULTIVITAMIN) TABS tablet Take 1 tablet by mouth daily.      No current facility-administered medications on file prior to visit.     PAST MEDICAL HISTORY: Past Medical History:  Diagnosis Date  .  Abnormal uterine bleeding (AUB)   . Anemia   . Anxiety   . Asthma    as child - no inhaler - no problem as adult  . Astigmatism   . Fibroids   . H/O diabetes insipidus    s/p craniotomy  . Headache(784.0)    hx - none since surgery  . Hx of blood clots 2016   P. E.  p craniotomy. on Xarelto until 2017.  . Joint pain    ankles and hips  . PCOS (polycystic ovarian syndrome)   . Pneumonia    Hx 3-4 yrs ago   . Pre-diabetes   . Seasonal allergies   . Vision abnormalities   . Vitamin D deficiency   . Wears glasses     PAST SURGICAL HISTORY: Past Surgical History:  Procedure Laterality Date  . CRANIOTOMY N/A  10/31/2014   Procedure: CRANIOTOMY FOR RESECTION OF TUMOR;  Surgeon: Jovita Gamma, MD;  Location: [facility];  Service: Neurosurgery;  Laterality: N/A;  Craniotomy for resection of tumor  . DILATATION & CURETTAGE/HYSTEROSCOPY WITH MYOSURE N/A 11/12/2016   Procedure: DILATATION & CURETTAGE/HYSTEROSCOPY WITH MYOSURE;  Surgeon: Marylynn Pearson, MD;  Location: [facility];  Service: Gynecology;  Laterality: N/A;  . DILATION AND CURETTAGE OF UTERUS     myomectomy  . MYOMECTOMY     vaginal -   . WISDOM TOOTH EXTRACTION  2006    SOCIAL HISTORY: Social History   Tobacco Use  . Smoking status: Never Smoker  . Smokeless tobacco: Never Used  Substance Use Topics  . Alcohol use: Yes    Alcohol/week: 0.0 oz    Comment: occasional wine  . Drug use: No    FAMILY HISTORY: Family History  Problem Relation Age of Onset  . Hypertension Mother   . Diabetes Mother   . Obesity Mother   . Ovarian cancer Unknown   . Breast cancer Unknown   . Fibroids Unknown     ROS: Review of Systems  Constitutional: Negative for weight loss.  Gastrointestinal: Negative for nausea and vomiting.  Musculoskeletal:       Negative muscle weakness  Psychiatric/Behavioral: Positive for depression. Negative for suicidal ideas.    PHYSICAL EXAM: Blood pressure 121/81, pulse 83, temperature 98.4 F (36.9 C), temperature source Oral, height 5\' 4"  (1.626 m), weight (!) 324 lb (147 kg), SpO2 100 %. Body mass index is 55.61 kg/m. Physical Exam  Constitutional: She is oriented to person, place, and time. She appears well-developed and well-nourished.  Cardiovascular: Normal rate.  Pulmonary/Chest: Effort normal.  Neurological: She is alert and oriented to person, place, and time.  Skin: Skin is warm and dry.  Psychiatric: She has a normal mood and affect.    RECENT LABS AND TESTS: BMET    Component Value Date/Time   NA 141 05/13/2017 0933   K 4.5 05/13/2017 0933   CL 102 05/13/2017 0933   CO2 26 05/13/2017  0933   GLUCOSE 90 05/13/2017 0933   GLUCOSE 92 11/06/2016 1355   BUN 8 05/13/2017 0933   CREATININE 0.98 05/13/2017 0933   CALCIUM 9.3 05/13/2017 0933   GFRNONAA 74 05/13/2017 0933   GFRAA 85 05/13/2017 0933   Lab Results  Component Value Date   HGBA1C 5.9 (H) 05/13/2017   HGBA1C 5.5 01/11/2017   HGBA1C 5.6 09/01/2016   HGBA1C 5.8 (H) 05/19/2016   HGBA1C 6.1 09/27/2015   Lab Results  Component Value Date   INSULIN 49.5 (H) 05/13/2017   INSULIN 52.3 (H) 01/11/2017  INSULIN 80.9 (H) 09/01/2016   CBC    Component Value Date/Time   WBC 7.9 05/13/2017 0933   WBC 10.9 (H) 11/06/2016 1355   RBC 4.38 05/13/2017 0933   RBC 3.95 11/06/2016 1355   HGB 11.3 05/13/2017 0933   HCT 36.9 05/13/2017 0933   PLT 516 (H) 01/11/2017 1205   MCV 84 05/13/2017 0933   MCH 25.8 (L) 05/13/2017 0933   MCH 25.8 (L) 11/06/2016 1355   MCHC 30.6 (L) 05/13/2017 0933   MCHC 31.2 11/06/2016 1355   RDW 19.4 (H) 05/13/2017 0933   LYMPHSABS 3.4 (H) 05/13/2017 0933   MONOABS 0.4 11/23/2014 1339   EOSABS 0.4 05/13/2017 0933   BASOSABS 0.0 05/13/2017 0933   Iron/TIBC/Ferritin/ %Sat    Component Value Date/Time   IRON 63 05/19/2016 1105   TIBC 343 05/19/2016 1105   FERRITIN 34 05/19/2016 1105   IRONPCTSAT 18 05/19/2016 1105   Lipid Panel     Component Value Date/Time   CHOL 158 05/13/2017 0933   TRIG 70 05/13/2017 0933   HDL 65 05/13/2017 0933   CHOLHDL 3 09/27/2015 1010   VLDL 21.6 09/27/2015 1010   LDLCALC 79 05/13/2017 0933   Hepatic Function Panel     Component Value Date/Time   PROT 6.7 05/13/2017 0933   ALBUMIN 3.6 05/13/2017 0933   AST 20 05/13/2017 0933   ALT 23 05/13/2017 0933   ALKPHOS 80 05/13/2017 0933   BILITOT <0.2 05/13/2017 0933      Component Value Date/Time   TSH 1.420 05/19/2016 1053   TSH 0.239 (L) 11/26/2014 1325   Results for EXIE, CHRISMER (MRN [id number]) as of 08/24/2017 15:29  Ref. Range 05/13/2017 09:33  Vitamin D, 25-Hydroxy Latest Ref Range: 30.0 -  100.0 ng/mL 29.8 (L)   ASSESSMENT AND PLAN: Vitamin D deficiency - Plan: Vitamin D, Ergocalciferol, (DRISDOL) 50000 units CAPS capsule  Other depression - with emotional eating   At risk for osteopenia  Class 3 severe obesity with serious comorbidity and body mass index (BMI) of 50.0 to 59.9 in adult, unspecified obesity type ([place])  PLAN:  Vitamin D Deficiency Amanda Murphy was informed that low vitamin D levels contributes to fatigue and are associated with obesity, breast, and colon cancer. Amanda Murphy agrees to continue taking prescription Vit D @50 ,000 IU every week #4 and we will refill for 1 month. She will follow up for routine testing of vitamin D, at least 2-3 times per year. She was informed of the risk of over-replacement of vitamin D and agrees to not increase her dose unless she discusses this with [place] first. Amanda Murphy agrees to follow up with our clinic in 2 weeks.  At risk for osteopenia and osteoporosis Amanda Murphy is at risk for osteopenia and osteoporsis due to her vitamin D deficiency. She was encouraged to take her vitamin D and follow her higher calcium diet and increase strengthening exercise to help strengthen her bones and decrease her risk of osteopenia and osteoporosis.  Depression with Emotional Eating Behaviors We discussed behavior modification techniques today to help Amanda Murphy deal with her emotional eating and depression. Amanda Murphy agrees to discontinue Wellbutrin and she agrees to follow up with our clinic in 2 weeks.  Obesity Amanda Murphy is currently in the action stage of change. As such, her goal is to get back to weightloss efforts  She has agreed to follow the Category 3 plan Amanda Murphy has been instructed to work up to a goal of 150 minutes of combined cardio and strengthening exercise per week  for weight loss and overall health benefits. We discussed the following Behavioral Modification Strategies today: increasing lean protein intake and planning for success We  discussed various medication options to help Amanda Murphy with her weight loss efforts and we both agreed to discontinue Saxenda (Pt has not been taking [place]).  Amanda Murphy has agreed to follow up with our clinic in 2 weeks. She was informed of the importance of frequent follow up visits to maximize her success with intensive lifestyle modifications for her multiple health conditions.   Amanda Murphy, am acting as transcriptionist for Lacy Duverney, PA-C I, Lacy Duverney [facility], have reviewed this note and agree with its content

## 2017-08-30 ENCOUNTER — Encounter: Payer: Self-pay | Admitting: Internal Medicine

## 2017-08-30 ENCOUNTER — Ambulatory Visit: Payer: 59 | Admitting: Internal Medicine

## 2017-08-30 VITALS — BP 124/86 | HR 98 | Temp 97.6°F | Ht 64.0 in | Wt 329.0 lb

## 2017-08-30 DIAGNOSIS — J019 Acute sinusitis, unspecified: Secondary | ICD-10-CM | POA: Diagnosis not present

## 2017-08-30 MED ORDER — AZITHROMYCIN 250 MG PO TABS: ORAL_TABLET | ORAL | 1 refills | Status: DC

## 2017-08-30 MED FILL — AZITHROMYCIN 250 MG TABLET: 250 | 5 days supply | Qty: 6 | Fill #0

## 2017-08-30 NOTE — Progress Notes (Signed)
Subjective:    Patient ID: Amanda Murphy, female    DOB: [birth year], 36 y.o.   MRN: [id number]  HPI   Here with 2-3 days acute onset fever, right ear > left ear and facial pain, hearing loss on the right,  pressure, headache, general weakness and malaise, and greenish d/c, with mild ST and cough, but pt denies chest pain, wheezing, increased sob or doe, orthopnea, PND, increased LE swelling, palpitations, dizziness or syncope.  Past Medical History:  Diagnosis Date  . Abnormal uterine bleeding (AUB)   . Anemia   . Anxiety   . Asthma    as child - no inhaler - no problem as adult  . Astigmatism   . Fibroids   . H/O diabetes insipidus    s/p craniotomy  . Headache(784.0)    hx - none since surgery  . Hx of blood clots 2016   P. E.  p craniotomy. on Xarelto until 2017.  . Joint pain    ankles and hips  . PCOS (polycystic ovarian syndrome)   . Pneumonia    Hx 3-4 yrs ago   . Pre-diabetes   . Seasonal allergies   . Vision abnormalities   . Vitamin D deficiency   . Wears glasses    Past Surgical History:  Procedure Laterality Date  . CRANIOTOMY N/A 10/31/2014   Procedure: CRANIOTOMY FOR RESECTION OF TUMOR;  Surgeon: Jovita Gamma, MD;  Location: [facility];  Service: Neurosurgery;  Laterality: N/A;  Craniotomy for resection of tumor  . DILATATION & CURETTAGE/HYSTEROSCOPY WITH MYOSURE N/A 11/12/2016   Procedure: DILATATION & CURETTAGE/HYSTEROSCOPY WITH MYOSURE;  Surgeon: Marylynn Pearson, MD;  Location: [facility];  Service: Gynecology;  Laterality: N/A;  . DILATION AND CURETTAGE OF UTERUS     myomectomy  . MYOMECTOMY     vaginal -   . WISDOM TOOTH EXTRACTION  2006    reports that she has never smoked. She has never used smokeless tobacco. She reports that she drinks alcohol. She reports that she does not use drugs. family history includes Breast cancer in her unknown relative; Diabetes in her mother; Fibroids in her unknown relative; Hypertension in her mother; Obesity in her  mother; Ovarian cancer in her unknown relative. No Known Allergies Current Outpatient Medications on File Prior to Visit  Medication Sig Dispense Refill  . acetaminophen (TYLENOL) 500 MG tablet Take 1,000 mg by mouth every 6 (six) hours as needed for mild pain.    [name] aspirin EC 325 MG tablet Take 325 mg by mouth daily.    . calcium carbonate (TUMS - DOSED IN MG ELEMENTAL CALCIUM) 500 MG chewable tablet Chew 1 tablet by mouth as needed. Reported on 07/31/2015    . docusate sodium (COLACE) 100 MG capsule Take 100 mg by mouth daily as needed for mild constipation.    . Insulin Pen Needle (BD PEN NEEDLE NANO U/F) 32G X 4 MM MISC 1 Package daily at 12 noon by Does not apply route. 100 each 0  . levocetirizine (XYZAL) 5 MG tablet Take 1 tablet (5 mg total) by mouth every evening. (Patient taking differently: Take 5 mg by mouth daily as needed. ) 30 tablet 11  . levonorgestrel (MIRENA) 20 MCG/24HR IUD 1 each by Intrauterine route once.    . Melatonin 3 MG TABS Take by mouth.    . Prenatal Vit-Fe Fumarate-FA (PRENATAL MULTIVITAMIN) TABS tablet Take 1 tablet by mouth daily.     . Vitamin D, Ergocalciferol, (DRISDOL) 50000 units  CAPS capsule Take 1 capsule (50,000 Units total) by mouth every 7 (seven) days. 4 capsule 0   No current facility-administered medications on file prior to visit.    Review of Systems All otherwise neg per pt    Objective:   Physical Exam BP 124/86   Pulse 98   Temp 97.6 F (36.4 C) (Oral)   Ht 5\' 4"  (1.626 m)   Wt (!) 329 lb (149.2 kg)   SpO2 96%   BMI 56.47 kg/m  VS noted, mild ill Constitutional: Pt appears in NAD HENT: Head: NCAT.  Right Ear: External ear normal.  Left Ear: External ear normal.  Bilat tm's with severe right > left erythema with mild effusions.  Max sinus areas mild tender.  Pharynx with mild erythema, no exudate Eyes: . Pupils are equal, round, and reactive to light. Conjunctivae and EOM are normal Nose: without d/c or deformity Neck: Neck  supple. Gross normal ROM Cardiovascular: Normal rate and regular rhythm.   Pulmonary/Chest: Effort normal and breath sounds without rales or wheezing.  Neurological: Pt is alert. At baseline orientation, motor grossly intact Skin: Skin is warm. No rashes, other new lesions, no LE edema Psychiatric: Pt behavior is normal without agitation  No other exam findings    Assessment & Plan:

## 2017-08-30 NOTE — Assessment & Plan Note (Signed)
Mild to mod, for antibx course,  to f/u any worsening symptoms or concerns, also for mucinex otc prn

## 2017-08-30 NOTE — Patient Instructions (Signed)
Please take all new medication as prescribed - the antibiotic  You can also take Delsym OTC for cough, and/or Mucinex (or it's generic off brand) for congestion, and tylenol as needed for pain.  Please continue all other medications as before, and refills have been done if requested.  Please have the pharmacy call with any other refills you may need.  Please keep your appointments with your specialists as you may have planned    

## 2017-09-08 ENCOUNTER — Ambulatory Visit (INDEPENDENT_AMBULATORY_CARE_PROVIDER_SITE_OTHER): Payer: 59 | Admitting: Physician Assistant

## 2017-09-08 VITALS — BP 121/81 | HR 69 | Temp 98.3°F | Ht 64.0 in | Wt 325.0 lb

## 2017-09-08 DIAGNOSIS — Z6841 Body Mass Index (BMI) 40.0 and over, adult: Secondary | ICD-10-CM | POA: Diagnosis not present

## 2017-09-08 DIAGNOSIS — E559 Vitamin D deficiency, unspecified: Secondary | ICD-10-CM | POA: Diagnosis not present

## 2017-09-08 NOTE — Progress Notes (Signed)
Office: [phone]  /  Fax: [phone]   HPI:   Chief Complaint: OBESITY Amanda Murphy is here to discuss her progress with her obesity treatment plan. She is on the Category 3 plan and is following her eating plan approximately 50 % of the time. She states she is doing water aerobics and zumba for 60 minutes 3 times per week. Amanda Murphy has been sick with an upper respiratory infection and has not been as mindful of her eating. She is now motivated to get back on track and continue with weight loss. Her weight is (!) 325 lb (147.4 kg) today and has had a weight gain of 1 pound over a period of 2 weeks since her last visit. She has gained 1 lb since starting treatment with [place].  Vitamin D deficiency Amanda Murphy has a diagnosis of vitamin D deficiency. She is currently taking vit D and denies nausea, vomiting or muscle weakness.  ALLERGIES: No Known Allergies  MEDICATIONS: Current Outpatient Medications on File Prior to Visit  Medication Sig Dispense Refill  . acetaminophen (TYLENOL) 500 MG tablet Take 1,000 mg by mouth every 6 (six) hours as needed for mild pain.    [name] aspirin EC 325 MG tablet Take 325 mg by mouth daily.    . calcium carbonate (TUMS - DOSED IN MG ELEMENTAL CALCIUM) 500 MG chewable tablet Chew 1 tablet by mouth as needed. Reported on 07/31/2015    . docusate sodium (COLACE) 100 MG capsule Take 100 mg by mouth daily as needed for mild constipation.    . Insulin Pen Needle (BD PEN NEEDLE NANO U/F) 32G X 4 MM MISC 1 Package daily at 12 noon by Does not apply route. 100 each 0  . levocetirizine (XYZAL) 5 MG tablet Take 1 tablet (5 mg total) by mouth every evening. (Patient taking differently: Take 5 mg by mouth daily as needed. ) 30 tablet 11  . levonorgestrel (MIRENA) 20 MCG/24HR IUD 1 each by Intrauterine route once.    . Melatonin 3 MG TABS Take by mouth.    . Prenatal Vit-Fe Fumarate-FA (PRENATAL MULTIVITAMIN) TABS tablet Take 1 tablet by mouth daily.     . Vitamin D,  Ergocalciferol, (DRISDOL) 50000 units CAPS capsule Take 1 capsule (50,000 Units total) by mouth every 7 (seven) days. 4 capsule 0   No current facility-administered medications on file prior to visit.     PAST MEDICAL HISTORY: Past Medical History:  Diagnosis Date  . Abnormal uterine bleeding (AUB)   . Anemia   . Anxiety   . Asthma    as child - no inhaler - no problem as adult  . Astigmatism   . Fibroids   . H/O diabetes insipidus    s/p craniotomy  . Headache(784.0)    hx - none since surgery  . Hx of blood clots 2016   P. E.  p craniotomy. on Xarelto until 2017.  . Joint pain    ankles and hips  . PCOS (polycystic ovarian syndrome)   . Pneumonia    Hx 3-4 yrs ago   . Pre-diabetes   . Seasonal allergies   . Vision abnormalities   . Vitamin D deficiency   . Wears glasses     PAST SURGICAL HISTORY: Past Surgical History:  Procedure Laterality Date  . CRANIOTOMY N/A 10/31/2014   Procedure: CRANIOTOMY FOR RESECTION OF TUMOR;  Surgeon: Jovita Gamma, MD;  Location: [facility];  Service: Neurosurgery;  Laterality: N/A;  Craniotomy for resection of tumor  .  DILATATION & CURETTAGE/HYSTEROSCOPY WITH MYOSURE N/A 11/12/2016   Procedure: DILATATION & CURETTAGE/HYSTEROSCOPY WITH MYOSURE;  Surgeon: Marylynn Pearson, MD;  Location: [facility];  Service: Gynecology;  Laterality: N/A;  . DILATION AND CURETTAGE OF UTERUS     myomectomy  . MYOMECTOMY     vaginal -   . WISDOM TOOTH EXTRACTION  2006    SOCIAL HISTORY: Social History   Tobacco Use  . Smoking status: Never Smoker  . Smokeless tobacco: Never Used  Substance Use Topics  . Alcohol use: Yes    Alcohol/week: 0.0 oz    Comment: occasional wine  . Drug use: No    FAMILY HISTORY: Family History  Problem Relation Age of Onset  . Hypertension Mother   . Diabetes Mother   . Obesity Mother   . Ovarian cancer Unknown   . Breast cancer Unknown   . Fibroids Unknown     ROS: Review of Systems  Constitutional:  Negative for weight loss.  Gastrointestinal: Negative for nausea and vomiting.  Musculoskeletal:       Negative for muscle weakness    PHYSICAL EXAM: Blood pressure 121/81, pulse 69, temperature 98.3 F (36.8 C), temperature source Oral, height 5\' 4"  (1.626 m), weight (!) 325 lb (147.4 kg), SpO2 98 %. Body mass index is 55.79 kg/m. Physical Exam  Constitutional: She is oriented to person, place, and time. She appears well-developed and well-nourished.  Cardiovascular: Normal rate.  Pulmonary/Chest: Effort normal.  Musculoskeletal: Normal range of motion.  Neurological: She is oriented to person, place, and time.  Skin: Skin is warm and dry.  Psychiatric: She has a normal mood and affect. Her behavior is normal.  Vitals reviewed.   RECENT LABS AND TESTS: BMET    Component Value Date/Time   NA 141 05/13/2017 0933   K 4.5 05/13/2017 0933   CL 102 05/13/2017 0933   CO2 26 05/13/2017 0933   GLUCOSE 90 05/13/2017 0933   GLUCOSE 92 11/06/2016 1355   BUN 8 05/13/2017 0933   CREATININE 0.98 05/13/2017 0933   CALCIUM 9.3 05/13/2017 0933   GFRNONAA 74 05/13/2017 0933   GFRAA 85 05/13/2017 0933   Lab Results  Component Value Date   HGBA1C 5.9 (H) 05/13/2017   HGBA1C 5.5 01/11/2017   HGBA1C 5.6 09/01/2016   HGBA1C 5.8 (H) 05/19/2016   HGBA1C 6.1 09/27/2015   Lab Results  Component Value Date   INSULIN 49.5 (H) 05/13/2017   INSULIN 52.3 (H) 01/11/2017   INSULIN 80.9 (H) 09/01/2016   CBC    Component Value Date/Time   WBC 7.9 05/13/2017 0933   WBC 10.9 (H) 11/06/2016 1355   RBC 4.38 05/13/2017 0933   RBC 3.95 11/06/2016 1355   HGB 11.3 05/13/2017 0933   HCT 36.9 05/13/2017 0933   PLT 516 (H) 01/11/2017 1205   MCV 84 05/13/2017 0933   MCH 25.8 (L) 05/13/2017 0933   MCH 25.8 (L) 11/06/2016 1355   MCHC 30.6 (L) 05/13/2017 0933   MCHC 31.2 11/06/2016 1355   RDW 19.4 (H) 05/13/2017 0933   LYMPHSABS 3.4 (H) 05/13/2017 0933   MONOABS 0.4 11/23/2014 1339   EOSABS 0.4  05/13/2017 0933   BASOSABS 0.0 05/13/2017 0933   Iron/TIBC/Ferritin/ %Sat    Component Value Date/Time   IRON 63 05/19/2016 1105   TIBC 343 05/19/2016 1105   FERRITIN 34 05/19/2016 1105   IRONPCTSAT 18 05/19/2016 1105   Lipid Panel     Component Value Date/Time   CHOL 158 05/13/2017 0933  TRIG 70 05/13/2017 0933   HDL 65 05/13/2017 0933   CHOLHDL 3 09/27/2015 1010   VLDL 21.6 09/27/2015 1010   LDLCALC 79 05/13/2017 0933   Hepatic Function Panel     Component Value Date/Time   PROT 6.7 05/13/2017 0933   ALBUMIN 3.6 05/13/2017 0933   AST 20 05/13/2017 0933   ALT 23 05/13/2017 0933   ALKPHOS 80 05/13/2017 0933   BILITOT <0.2 05/13/2017 0933      Component Value Date/Time   TSH 1.420 05/19/2016 1053   TSH 0.239 (L) 11/26/2014 1325   Results for FERRIN, LIEBIG (MRN [id number]) as of 09/08/2017 15:44  Ref. Range 05/13/2017 09:33  Vitamin D, 25-Hydroxy Latest Ref Range: 30.0 - 100.0 ng/mL 29.8 (L)   ASSESSMENT AND PLAN: Vitamin D deficiency  Class 3 severe obesity with serious comorbidity and body mass index (BMI) of 50.0 to 59.9 in adult, unspecified obesity type ([place])  PLAN:  Vitamin D Deficiency Amanda Murphy was informed that low vitamin D levels contributes to fatigue and are associated with obesity, breast, and colon cancer. She agrees to continue to take prescription Vit D @50 ,000 IU every week and will follow up for routine testing of vitamin D, at least 2-3 times per year. She was informed of the risk of over-replacement of vitamin D and agrees to not increase her dose unless she discusses this with [place] first.  We spent > than 50% of the 15 minute visit on the counseling as documented in the note.  Obesity Amanda Murphy is currently in the action stage of change. As such, her goal is to continue with weight loss efforts She has agreed to follow the Category 3 plan Amanda Murphy has been instructed to work up to a goal of 150 minutes of combined cardio and strengthening  exercise per week for weight loss and overall health benefits. We discussed the following Behavioral Modification Strategies today: increasing lean protein intake and work on meal planning and easy cooking plans  Amanda Murphy has agreed to follow up with our clinic in 2 weeks. She was informed of the importance of frequent follow up visits to maximize her success with intensive lifestyle modifications for her multiple health conditions.  Amanda Murphy, am acting as transcriptionist for [employer], PA-C I, Amanda Murphy [facility], have reviewed this note and agree with its content

## 2017-09-22 ENCOUNTER — Ambulatory Visit (INDEPENDENT_AMBULATORY_CARE_PROVIDER_SITE_OTHER): Payer: 59 | Admitting: Physician Assistant

## 2017-09-22 VITALS — BP 129/84 | HR 94 | Temp 98.5°F | Ht 64.0 in | Wt 327.0 lb

## 2017-09-22 DIAGNOSIS — F3289 Other specified depressive episodes: Secondary | ICD-10-CM

## 2017-09-22 DIAGNOSIS — Z6841 Body Mass Index (BMI) 40.0 and over, adult: Secondary | ICD-10-CM

## 2017-09-22 DIAGNOSIS — Z9189 Other specified personal risk factors, not elsewhere classified: Secondary | ICD-10-CM | POA: Diagnosis not present

## 2017-09-22 DIAGNOSIS — E559 Vitamin D deficiency, unspecified: Secondary | ICD-10-CM

## 2017-09-22 MED ORDER — VITAMIN D (ERGOCALCIFEROL) 1.25 MG (50000 UNIT) PO CAPS: 50000.0000 [IU] | ORAL_CAPSULE | ORAL | 0 refills | Status: DC

## 2017-09-22 MED ORDER — BUPROPION HCL ER (SR) 100 MG PO TB12
100.0000 mg | ORAL_TABLET | Freq: Every day | ORAL | 0 refills | Status: DC
Start: 2017-09-22 — End: 2017-09-22

## 2017-09-22 MED ORDER — BUPROPION HCL ER (SR) 200 MG PO TB12
200.0000 mg | ORAL_TABLET | Freq: Every day | ORAL | 0 refills | Status: DC
Start: 2017-09-22 — End: 2018-02-01

## 2017-09-22 MED FILL — VIT D2 1.25 MG (50,000 UNIT: 1.25 MG | 28 days supply | Qty: 4 | Fill #0

## 2017-09-22 NOTE — Progress Notes (Signed)
Office: [phone]  /  Fax: [phone]   HPI:   Chief Complaint: OBESITY Amanda Murphy is here to discuss her progress with her obesity treatment plan. She is on the Category 3 plan and is following her eating plan approximately 10 % of the time. She states she is doing water aerobics for 60-90 minutes 4 times per week. Amanda Murphy struggled with emotional eating. She lost a family member unexpectedly.  Her weight is (!) 327 lb (148.3 kg) today and has gained 2 pounds since her last visit. She has lost 0 lbs since starting treatment with [place].  Vitamin D Deficiency Amanda Murphy has a diagnosis of vitamin D deficiency. She is currently taking prescription Vit D and denies nausea, vomiting or muscle weakness.  At risk for osteopenia and osteoporosis Amanda Murphy is at higher risk of osteopenia and osteoporosis due to vitamin D deficiency.   Depression with emotional eating behaviors Amanda Murphy's mood is stable. She noticed increase in emotional eating and cravings, and using food for comfort to the extent that it is negatively impacting her health. She often snacks when she is not hungry. Amanda Murphy sometimes feels she is out of control and then feels guilty that she made poor food choices. She has been working on behavior modification techniques to help reduce her emotional eating and has been somewhat successful. She shows no sign of suicidal or homicidal ideations.  Depression screen Amanda Murphy 2/9 07/02/2017 05/19/2016  Decreased Interest 0 2  Down, Depressed, Hopeless 0 1  PHQ - 2 Score 0 3  Altered sleeping 2 1  Tired, decreased energy 1 3  Change in appetite 0 3  Feeling bad or failure about yourself  0 2  Trouble concentrating 0 1  Moving slowly or fidgety/restless 0 2  Suicidal thoughts 0 0  PHQ-9 Score 3 15    ALLERGIES: No Known Allergies  MEDICATIONS: Current Outpatient Medications on File Prior to Visit  Medication Sig Dispense Refill  . acetaminophen (TYLENOL) 500 MG tablet Take 1,000 mg  by mouth every 6 (six) hours as needed for mild pain.    [name] aspirin EC 325 MG tablet Take 325 mg by mouth daily.    . calcium carbonate (TUMS - DOSED IN MG ELEMENTAL CALCIUM) 500 MG chewable tablet Chew 1 tablet by mouth as needed. Reported on 07/31/2015    . docusate sodium (COLACE) 100 MG capsule Take 100 mg by mouth daily as needed for mild constipation.    . Insulin Pen Needle (BD PEN NEEDLE NANO U/F) 32G X 4 MM MISC 1 Package daily at 12 noon by Does not apply route. 100 each 0  . levocetirizine (XYZAL) 5 MG tablet Take 1 tablet (5 mg total) by mouth every evening. (Patient taking differently: Take 5 mg by mouth daily as needed. ) 30 tablet 11  . levonorgestrel (MIRENA) 20 MCG/24HR IUD 1 each by Intrauterine route once.    . Melatonin 3 MG TABS Take by mouth.    . Prenatal Vit-Fe Fumarate-FA (PRENATAL MULTIVITAMIN) TABS tablet Take 1 tablet by mouth daily.      No current facility-administered medications on file prior to visit.     PAST MEDICAL HISTORY: Past Medical History:  Diagnosis Date  . Abnormal uterine bleeding (AUB)   . Anemia   . Anxiety   . Asthma    as child - no inhaler - no problem as adult  . Astigmatism   . Fibroids   . H/O diabetes insipidus    s/p craniotomy  . Headache(784.0)  hx - none since surgery  . Hx of blood clots 2016   P. E.  p craniotomy. on Xarelto until 2017.  . Joint pain    ankles and hips  . PCOS (polycystic ovarian syndrome)   . Pneumonia    Hx 3-4 yrs ago   . Pre-diabetes   . Seasonal allergies   . Vision abnormalities   . Vitamin D deficiency   . Wears glasses     PAST SURGICAL HISTORY: Past Surgical History:  Procedure Laterality Date  . CRANIOTOMY N/A 10/31/2014   Procedure: CRANIOTOMY FOR RESECTION OF TUMOR;  Surgeon: Jovita Gamma, MD;  Location: [facility];  Service: Neurosurgery;  Laterality: N/A;  Craniotomy for resection of tumor  . DILATATION & CURETTAGE/HYSTEROSCOPY WITH MYOSURE N/A 11/12/2016   Procedure: DILATATION  & CURETTAGE/HYSTEROSCOPY WITH MYOSURE;  Surgeon: Marylynn Pearson, MD;  Location: [facility];  Service: Gynecology;  Laterality: N/A;  . DILATION AND CURETTAGE OF UTERUS     myomectomy  . MYOMECTOMY     vaginal -   . WISDOM TOOTH EXTRACTION  2006    SOCIAL HISTORY: Social History   Tobacco Use  . Smoking status: Never Smoker  . Smokeless tobacco: Never Used  Substance Use Topics  . Alcohol use: Yes    Alcohol/week: 0.0 oz    Comment: occasional wine  . Drug use: No    FAMILY HISTORY: Family History  Problem Relation Age of Onset  . Hypertension Mother   . Diabetes Mother   . Obesity Mother   . Ovarian cancer Unknown   . Breast cancer Unknown   . Fibroids Unknown     ROS: Review of Systems  Constitutional: Negative for weight loss.  Gastrointestinal: Negative for nausea and vomiting.  Musculoskeletal:       Negative muscle weakness  Psychiatric/Behavioral: Positive for depression. Negative for suicidal ideas.    PHYSICAL EXAM: Blood pressure 129/84, pulse 94, temperature 98.5 F (36.9 C), height 5\' 4"  (1.626 m), weight (!) 327 lb (148.3 kg), SpO2 97 %. Body mass index is 56.13 kg/m. Physical Exam  Constitutional: She is oriented to person, place, and time. She appears well-developed and well-nourished.  Cardiovascular: Normal rate.  Pulmonary/Chest: Effort normal.  Musculoskeletal: Normal range of motion.  Neurological: She is oriented to person, place, and time.  Skin: Skin is warm and dry.  Psychiatric: She has a normal mood and affect. Her behavior is normal.  Vitals reviewed.   RECENT LABS AND TESTS: BMET    Component Value Date/Time   NA 141 05/13/2017 0933   K 4.5 05/13/2017 0933   CL 102 05/13/2017 0933   CO2 26 05/13/2017 0933   GLUCOSE 90 05/13/2017 0933   GLUCOSE 92 11/06/2016 1355   BUN 8 05/13/2017 0933   CREATININE 0.98 05/13/2017 0933   CALCIUM 9.3 05/13/2017 0933   GFRNONAA 74 05/13/2017 0933   GFRAA 85 05/13/2017 0933   Lab Results    Component Value Date   HGBA1C 5.9 (H) 05/13/2017   HGBA1C 5.5 01/11/2017   HGBA1C 5.6 09/01/2016   HGBA1C 5.8 (H) 05/19/2016   HGBA1C 6.1 09/27/2015   Lab Results  Component Value Date   INSULIN 49.5 (H) 05/13/2017   INSULIN 52.3 (H) 01/11/2017   INSULIN 80.9 (H) 09/01/2016   CBC    Component Value Date/Time   WBC 7.9 05/13/2017 0933   WBC 10.9 (H) 11/06/2016 1355   RBC 4.38 05/13/2017 0933   RBC 3.95 11/06/2016 1355   HGB 11.3 05/13/2017  0933   HCT 36.9 05/13/2017 0933   PLT 516 (H) 01/11/2017 1205   MCV 84 05/13/2017 0933   MCH 25.8 (L) 05/13/2017 0933   MCH 25.8 (L) 11/06/2016 1355   MCHC 30.6 (L) 05/13/2017 0933   MCHC 31.2 11/06/2016 1355   RDW 19.4 (H) 05/13/2017 0933   LYMPHSABS 3.4 (H) 05/13/2017 0933   MONOABS 0.4 11/23/2014 1339   EOSABS 0.4 05/13/2017 0933   BASOSABS 0.0 05/13/2017 0933   Iron/TIBC/Ferritin/ %Sat    Component Value Date/Time   IRON 63 05/19/2016 1105   TIBC 343 05/19/2016 1105   FERRITIN 34 05/19/2016 1105   IRONPCTSAT 18 05/19/2016 1105   Lipid Panel     Component Value Date/Time   CHOL 158 05/13/2017 0933   TRIG 70 05/13/2017 0933   HDL 65 05/13/2017 0933   CHOLHDL 3 09/27/2015 1010   VLDL 21.6 09/27/2015 1010   LDLCALC 79 05/13/2017 0933   Hepatic Function Panel     Component Value Date/Time   PROT 6.7 05/13/2017 0933   ALBUMIN 3.6 05/13/2017 0933   AST 20 05/13/2017 0933   ALT 23 05/13/2017 0933   ALKPHOS 80 05/13/2017 0933   BILITOT <0.2 05/13/2017 0933      Component Value Date/Time   TSH 1.420 05/19/2016 1053   TSH 0.239 (L) 11/26/2014 1325  Results for RACQUEL, ARKIN (MRN [id number]) as of 09/22/2017 17:15  Ref. Range 05/13/2017 09:33  Vitamin D, 25-Hydroxy Latest Ref Range: 30.0 - 100.0 ng/mL 29.8 (L)    ASSESSMENT AND PLAN: Vitamin D deficiency - Plan: Vitamin D, Ergocalciferol, (DRISDOL) 50000 units CAPS capsule  Other depression - with emotional eating - Plan: buPROPion (WELLBUTRIN SR) 100 MG 12 hr  tablet  At risk for osteoporosis  Class 3 severe obesity with serious comorbidity and body mass index (BMI) of 50.0 to 59.9 in adult, unspecified obesity type (Amanda Murphy)  PLAN:  Vitamin D Deficiency Khamora was informed that low vitamin D levels contributes to fatigue and are associated with obesity, breast, and colon cancer. Yazleen agrees to continue taking prescription Vit D @50 ,000 IU every week #4 and we will refill for 1 month. She will follow up for routine testing of vitamin D, at least 2-3 times per year. She was informed of the risk of over-replacement of vitamin D and agrees to not increase her dose unless she discusses this with [place] first. Ijanae agrees to follow up with our clinic in 2 weeks.  At risk for osteopenia and osteoporosis Gratia is at risk for osteopenia and osteoporsis due to her vitamin D deficiency. She was encouraged to take her vitamin D and follow her higher calcium diet and increase strengthening exercise to help strengthen her bones and decrease her risk of osteopenia and osteoporosis.  Depression with Emotional Eating Behaviors We discussed behavior modification techniques today to help Mairely deal with her emotional eating and depression. Caden agrees to restart Wellbutrin SR 200 mg qd #30 with no refills and she agrees to follow up with our clinic in 2 weeks.  Obesity Nami is currently in the action stage of change. As such, her goal is to continue with weight loss efforts She has agreed to follow the Category 3 plan Yatziri has been instructed to work up to a goal of 150 minutes of combined cardio and strengthening exercise per week for weight loss and overall health benefits. We discussed the following Behavioral Modification Strategies today: increasing lean protein intake and emotional eating strategies   Maxcine has  agreed to follow up with our clinic in 2 weeks. She was informed of the importance of frequent follow up visits to maximize her  success with intensive lifestyle modifications for her multiple health conditions.   Wilhemena Durie, am acting as transcriptionist for Lacy Duverney, PA-C I, Lacy Duverney [facility], have reviewed this note and agree with its content

## 2017-09-23 MED FILL — BUPROPION HCL SR 200 MG TAB: 200 | 30 days supply | Qty: 30 | Fill #0

## 2017-10-06 ENCOUNTER — Ambulatory Visit (INDEPENDENT_AMBULATORY_CARE_PROVIDER_SITE_OTHER): Payer: 59 | Admitting: Physician Assistant

## 2017-10-06 VITALS — BP 130/83 | HR 90 | Temp 98.6°F | Ht 64.0 in | Wt 324.0 lb

## 2017-10-06 DIAGNOSIS — R7303 Prediabetes: Secondary | ICD-10-CM

## 2017-10-06 DIAGNOSIS — Z6841 Body Mass Index (BMI) 40.0 and over, adult: Secondary | ICD-10-CM

## 2017-10-06 MED ORDER — EXENATIDE ER 2 MG ~~LOC~~ PEN: 2.0000 mg | PEN_INJECTOR | SUBCUTANEOUS | 0 refills | Status: DC

## 2017-10-07 NOTE — Progress Notes (Signed)
Office: [phone]  /  Fax: [phone]   HPI:   Chief Complaint: OBESITY Amanda Murphy is here to discuss her progress with her obesity treatment plan. She is on the Category 3 plan and is following her eating plan approximately 70 % of the time. She states she is walking and doing water aerobics for 60 minutes 4 times per week. Amanda Murphy continues to do well with weight loss. Has been concentrating more on pre-planning her meals. She has noticed some hunger in the evening.  Her weight is (!) 324 lb (147 kg) today and has had a weight loss of 3 pounds over a period of 2 weeks since her last visit. She has lost 0 lbs since starting treatment with [place].  Pre-Diabetes Amanda Murphy has a diagnosis of pre-diabetes based on her elevated Hgb A1c and was informed this puts her at greater risk of developing diabetes. She notes polyphagia, she requests to go back on Bydureon and declines Saxenda. She continues to work on diet and exercise to decrease risk of diabetes. She denies nausea or hypoglycemia.  ALLERGIES: No Known Allergies  MEDICATIONS: Current Outpatient Medications on File Prior to Visit  Medication Sig Dispense Refill  . acetaminophen (TYLENOL) 500 MG tablet Take 1,000 mg by mouth every 6 (six) hours as needed for mild pain.    [name] aspirin EC 325 MG tablet Take 325 mg by mouth daily.    [name] buPROPion (WELLBUTRIN SR) 200 MG 12 hr tablet Take 1 tablet (200 mg total) by mouth daily. 30 tablet 0  . calcium carbonate (TUMS - DOSED IN MG ELEMENTAL CALCIUM) 500 MG chewable tablet Chew 1 tablet by mouth as needed. Reported on 07/31/2015    . docusate sodium (COLACE) 100 MG capsule Take 100 mg by mouth daily as needed for mild constipation.    . Insulin Pen Needle (BD PEN NEEDLE NANO U/F) 32G X 4 MM MISC 1 Package daily at 12 noon by Does not apply route. 100 each 0  . levocetirizine (XYZAL) 5 MG tablet Take 1 tablet (5 mg total) by mouth every evening. (Patient taking differently: Take 5 mg by mouth daily  as needed. ) 30 tablet 11  . levonorgestrel (MIRENA) 20 MCG/24HR IUD 1 each by Intrauterine route once.    . Melatonin 3 MG TABS Take by mouth.    . Prenatal Vit-Fe Fumarate-FA (PRENATAL MULTIVITAMIN) TABS tablet Take 1 tablet by mouth daily.     . Vitamin D, Ergocalciferol, (DRISDOL) 50000 units CAPS capsule Take 1 capsule (50,000 Units total) by mouth every 7 (seven) days. 4 capsule 0   No current facility-administered medications on file prior to visit.     PAST MEDICAL HISTORY: Past Medical History:  Diagnosis Date  . Abnormal uterine bleeding (AUB)   . Anemia   . Anxiety   . Asthma    as child - no inhaler - no problem as adult  . Astigmatism   . Fibroids   . H/O diabetes insipidus    s/p craniotomy  . Headache(784.0)    hx - none since surgery  . Hx of blood clots 2016   P. E.  p craniotomy. on Xarelto until 2017.  . Joint pain    ankles and hips  . PCOS (polycystic ovarian syndrome)   . Pneumonia    Hx 3-4 yrs ago   . Pre-diabetes   . Seasonal allergies   . Vision abnormalities   . Vitamin D deficiency   . Wears glasses  PAST SURGICAL HISTORY: Past Surgical History:  Procedure Laterality Date  . CRANIOTOMY N/A 10/31/2014   Procedure: CRANIOTOMY FOR RESECTION OF TUMOR;  Surgeon: Jovita Gamma, MD;  Location: [place];  Service: Neurosurgery;  Laterality: N/A;  Craniotomy for resection of tumor  . DILATATION & CURETTAGE/HYSTEROSCOPY WITH MYOSURE N/A 11/12/2016   Procedure: DILATATION & CURETTAGE/HYSTEROSCOPY WITH MYOSURE;  Surgeon: Marylynn Pearson, MD;  Location: [facility];  Service: Gynecology;  Laterality: N/A;  . DILATION AND CURETTAGE OF UTERUS     myomectomy  . MYOMECTOMY     vaginal -   . WISDOM TOOTH EXTRACTION  2006    SOCIAL HISTORY: Social History   Tobacco Use  . Smoking status: Never Smoker  . Smokeless tobacco: Never Used  Substance Use Topics  . Alcohol use: Yes    Alcohol/week: 0.0 oz    Comment: occasional wine  . Drug use: No     FAMILY HISTORY: Family History  Problem Relation Age of Onset  . Hypertension Mother   . Diabetes Mother   . Obesity Mother   . Ovarian cancer Unknown   . Breast cancer Unknown   . Fibroids Unknown     ROS: Review of Systems  Constitutional: Positive for weight loss.  Gastrointestinal: Negative for nausea.  Endo/Heme/Allergies:       Positive polyphagia Negative hypoglycemia    PHYSICAL EXAM: Blood pressure 130/83, pulse 90, temperature 98.6 F (37 C), temperature source Oral, height 5\' 4"  (1.626 m), weight (!) 324 lb (147 kg), SpO2 96 %. Body mass index is 55.61 kg/m. Physical Exam  Constitutional: She is oriented to person, place, and time. She appears well-developed and well-nourished.  Cardiovascular: Normal rate.  Pulmonary/Chest: Effort normal.  Musculoskeletal: Normal range of motion.  Neurological: She is oriented to person, place, and time.  Skin: Skin is warm and dry.  Psychiatric: She has a normal mood and affect. Her behavior is normal.  Vitals reviewed.   RECENT LABS AND TESTS: BMET    Component Value Date/Time   NA 141 05/13/2017 0933   K 4.5 05/13/2017 0933   CL 102 05/13/2017 0933   CO2 26 05/13/2017 0933   GLUCOSE 90 05/13/2017 0933   GLUCOSE 92 11/06/2016 1355   BUN 8 05/13/2017 0933   CREATININE 0.98 05/13/2017 0933   CALCIUM 9.3 05/13/2017 0933   GFRNONAA 74 05/13/2017 0933   GFRAA 85 05/13/2017 0933   Lab Results  Component Value Date   HGBA1C 5.9 (H) 05/13/2017   HGBA1C 5.5 01/11/2017   HGBA1C 5.6 09/01/2016   HGBA1C 5.8 (H) 05/19/2016   HGBA1C 6.1 09/27/2015   Lab Results  Component Value Date   INSULIN 49.5 (H) 05/13/2017   INSULIN 52.3 (H) 01/11/2017   INSULIN 80.9 (H) 09/01/2016   CBC    Component Value Date/Time   WBC 7.9 05/13/2017 0933   WBC 10.9 (H) 11/06/2016 1355   RBC 4.38 05/13/2017 0933   RBC 3.95 11/06/2016 1355   HGB 11.3 05/13/2017 0933   HCT 36.9 05/13/2017 0933   PLT 516 (H) 01/11/2017 1205   MCV  84 05/13/2017 0933   MCH 25.8 (L) 05/13/2017 0933   MCH 25.8 (L) 11/06/2016 1355   MCHC 30.6 (L) 05/13/2017 0933   MCHC 31.2 11/06/2016 1355   RDW 19.4 (H) 05/13/2017 0933   LYMPHSABS 3.4 (H) 05/13/2017 0933   MONOABS 0.4 11/23/2014 1339   EOSABS 0.4 05/13/2017 0933   BASOSABS 0.0 05/13/2017 0933   Iron/TIBC/Ferritin/ %Sat    Component  Value Date/Time   IRON 63 05/19/2016 1105   TIBC 343 05/19/2016 1105   FERRITIN 34 05/19/2016 1105   IRONPCTSAT 18 05/19/2016 1105   Lipid Panel     Component Value Date/Time   CHOL 158 05/13/2017 0933   TRIG 70 05/13/2017 0933   HDL 65 05/13/2017 0933   CHOLHDL 3 09/27/2015 1010   VLDL 21.6 09/27/2015 1010   LDLCALC 79 05/13/2017 0933   Hepatic Function Panel     Component Value Date/Time   PROT 6.7 05/13/2017 0933   ALBUMIN 3.6 05/13/2017 0933   AST 20 05/13/2017 0933   ALT 23 05/13/2017 0933   ALKPHOS 80 05/13/2017 0933   BILITOT <0.2 05/13/2017 0933      Component Value Date/Time   TSH 1.420 05/19/2016 1053   TSH 0.239 (L) 11/26/2014 1325    ASSESSMENT AND PLAN: Prediabetes  Class 3 severe obesity with serious comorbidity and body mass index (BMI) of 50.0 to 59.9 in adult, unspecified obesity type ([place]) - Plan: Exenatide ER (BYDUREON) 2 MG PEN  PLAN:  Pre-Diabetes Amanda Murphy will continue to work on weight loss, diet, exercise, and decreasing simple carbohydrates in her diet to help decrease the risk of diabetes. We dicussed metformin including benefits and risks. She was informed that eating too many simple carbohydrates or too many calories at one sitting increases the likelihood of GI side effects. Amanda Murphy agrees to follow up with our clinic in 3 weeks as directed to monitor her progress.  We spent > than 50% of the 15 minute visit on the counseling as documented in the note.  Obesity Amanda Murphy is currently in the action stage of change. As such, her goal is to continue with weight loss efforts She has agreed to follow  the Category 3 plan Amanda Murphy has been instructed to work up to a goal of 150 minutes of combined cardio and strengthening exercise per week for weight loss and overall health benefits. We discussed the following Behavioral Modification Strategies today: increasing lean protein intake and work on meal planning and easy cooking plans We discussed various medication options to help [place] with her weight loss efforts and we both agreed to restart Bydureon 2 mg Pen once weekly (#4 pens) with no refills.  Amanda Murphy has agreed to follow up with our clinic in 3 weeks. She was informed of the importance of frequent follow up visits to maximize her success with intensive lifestyle modifications for her multiple health conditions.   Amanda Murphy, am acting as transcriptionist for Lacy Duverney, PA-C I, Lacy Duverney [facility], have reviewed this note and agree with its content

## 2017-10-25 ENCOUNTER — Encounter (INDEPENDENT_AMBULATORY_CARE_PROVIDER_SITE_OTHER): Payer: Self-pay

## 2017-10-25 ENCOUNTER — Ambulatory Visit (INDEPENDENT_AMBULATORY_CARE_PROVIDER_SITE_OTHER): Payer: 59 | Admitting: Physician Assistant

## 2017-10-26 ENCOUNTER — Ambulatory Visit (INDEPENDENT_AMBULATORY_CARE_PROVIDER_SITE_OTHER): Payer: 59 | Admitting: Physician Assistant

## 2017-10-26 VITALS — BP 120/81 | HR 90 | Temp 98.5°F | Ht 64.0 in | Wt 327.0 lb

## 2017-10-26 DIAGNOSIS — E559 Vitamin D deficiency, unspecified: Secondary | ICD-10-CM

## 2017-10-26 DIAGNOSIS — R7303 Prediabetes: Secondary | ICD-10-CM

## 2017-10-26 DIAGNOSIS — Z6841 Body Mass Index (BMI) 40.0 and over, adult: Secondary | ICD-10-CM | POA: Diagnosis not present

## 2017-10-26 DIAGNOSIS — Z9189 Other specified personal risk factors, not elsewhere classified: Secondary | ICD-10-CM

## 2017-10-26 MED ORDER — VITAMIN D (ERGOCALCIFEROL) 1.25 MG (50000 UNIT) PO CAPS: 50000.0000 [IU] | ORAL_CAPSULE | ORAL | 0 refills | Status: DC

## 2017-10-26 NOTE — Progress Notes (Signed)
Office: [phone]  /  Fax: [phone]   HPI:   Chief Complaint: OBESITY Amanda Murphy is here to discuss her progress with her obesity treatment plan. She is on the Category 3 plan and is following her eating plan approximately 50 % of the time. She states she is doing boot camp for 30 to 45 minutes 3 times per week. Kaymarie had increased celebration eating and has not been as mindful of her eating. She states she is motivated to get back to weight loss over the next few weeks. Her weight is (!) 327 lb (148.3 kg) today and has had a weight gain of 3 pounds over a period of 2 to 3 weeks since her last visit. She has gained 3 lbs since starting treatment with [place].  Vitamin D deficiency Amanda Murphy has a diagnosis of vitamin D deficiency. She is currently taking vit D and denies nausea, vomiting or muscle weakness.  At risk for osteopenia and osteoporosis Amanda Murphy is at higher risk of osteopenia and osteoporosis due to vitamin D deficiency.   Pre-Diabetes Amanda Murphy has a diagnosis of prediabetes based on her elevated Hgb A1c and was informed this puts her at greater risk of developing diabetes. Amanda Murphy is not on metformin and she declines metformin due to previously experienced side effects. Amanda Murphy continues to work on diet and exercise to decrease risk of diabetes. She denies nausea or hypoglycemia.  ALLERGIES: No Known Allergies  MEDICATIONS: Current Outpatient Medications on File Prior to Visit  Medication Sig Dispense Refill  . acetaminophen (TYLENOL) 500 MG tablet Take 1,000 mg by mouth every 6 (six) hours as needed for mild pain.    [name] aspirin EC 325 MG tablet Take 325 mg by mouth daily.    [name] buPROPion (WELLBUTRIN SR) 200 MG 12 hr tablet Take 1 tablet (200 mg total) by mouth daily. 30 tablet 0  . calcium carbonate (TUMS - DOSED IN MG ELEMENTAL CALCIUM) 500 MG chewable tablet Chew 1 tablet by mouth as needed. Reported on 07/31/2015    . docusate sodium (COLACE) 100 MG capsule Take 100  mg by mouth daily as needed for mild constipation.    [name] levocetirizine (XYZAL) 5 MG tablet Take 1 tablet (5 mg total) by mouth every evening. (Patient taking differently: Take 5 mg by mouth daily as needed. ) 30 tablet 11  . levonorgestrel (MIRENA) 20 MCG/24HR IUD 1 each by Intrauterine route once.    . Melatonin 3 MG TABS Take by mouth.    . Prenatal Vit-Fe Fumarate-FA (PRENATAL MULTIVITAMIN) TABS tablet Take 1 tablet by mouth daily.      No current facility-administered medications on file prior to visit.     PAST MEDICAL HISTORY: Past Medical History:  Diagnosis Date  . Abnormal uterine bleeding (AUB)   . Anemia   . Anxiety   . Asthma    as child - no inhaler - no problem as adult  . Astigmatism   . Fibroids   . H/O diabetes insipidus    s/p craniotomy  . Headache(784.0)    hx - none since surgery  . Hx of blood clots 2016   P. E.  p craniotomy. on Xarelto until 2017.  . Joint pain    ankles and hips  . PCOS (polycystic ovarian syndrome)   . Pneumonia    Hx 3-4 yrs ago   . Pre-diabetes   . Seasonal allergies   . Vision abnormalities   . Vitamin D deficiency   . Wears glasses  PAST SURGICAL HISTORY: Past Surgical History:  Procedure Laterality Date  . CRANIOTOMY N/A 10/31/2014   Procedure: CRANIOTOMY FOR RESECTION OF TUMOR;  Surgeon: Jovita Gamma, MD;  Location: [facility];  Service: Neurosurgery;  Laterality: N/A;  Craniotomy for resection of tumor  . DILATATION & CURETTAGE/HYSTEROSCOPY WITH MYOSURE N/A 11/12/2016   Procedure: DILATATION & CURETTAGE/HYSTEROSCOPY WITH MYOSURE;  Surgeon: Marylynn Pearson, MD;  Location: [facility];  Service: Gynecology;  Laterality: N/A;  . DILATION AND CURETTAGE OF UTERUS     myomectomy  . MYOMECTOMY     vaginal -   . WISDOM TOOTH EXTRACTION  2006    SOCIAL HISTORY: Social History   Tobacco Use  . Smoking status: Never Smoker  . Smokeless tobacco: Never Used  Substance Use Topics  . Alcohol use: Yes    Alcohol/week: 0.0  oz    Comment: occasional wine  . Drug use: No    FAMILY HISTORY: Family History  Problem Relation Age of Onset  . Hypertension Mother   . Diabetes Mother   . Obesity Mother   . Ovarian cancer Unknown   . Breast cancer Unknown   . Fibroids Unknown     ROS: Review of Systems  Constitutional: Negative for weight loss.  Gastrointestinal: Negative for nausea and vomiting.  Musculoskeletal:       Negative for muscle weakness  Endo/Heme/Allergies:       Negative for hypoglycemia    PHYSICAL EXAM: Blood pressure 120/81, pulse 90, temperature 98.5 F (36.9 C), height 5\' 4"  (1.626 m), weight (!) 327 lb (148.3 kg), SpO2 97 %. Body mass index is 56.13 kg/m. Physical Exam  Constitutional: She is oriented to person, place, and time. She appears well-developed and well-nourished.  Cardiovascular: Normal rate.  Pulmonary/Chest: Effort normal.  Musculoskeletal: Normal range of motion.  Neurological: She is oriented to person, place, and time.  Skin: Skin is warm and dry.  Psychiatric: She has a normal mood and affect. Her behavior is normal.  Vitals reviewed.   RECENT LABS AND TESTS: BMET    Component Value Date/Time   NA 141 05/13/2017 0933   K 4.5 05/13/2017 0933   CL 102 05/13/2017 0933   CO2 26 05/13/2017 0933   GLUCOSE 90 05/13/2017 0933   GLUCOSE 92 11/06/2016 1355   BUN 8 05/13/2017 0933   CREATININE 0.98 05/13/2017 0933   CALCIUM 9.3 05/13/2017 0933   GFRNONAA 74 05/13/2017 0933   GFRAA 85 05/13/2017 0933   Lab Results  Component Value Date   HGBA1C 5.9 (H) 05/13/2017   HGBA1C 5.5 01/11/2017   HGBA1C 5.6 09/01/2016   HGBA1C 5.8 (H) 05/19/2016   HGBA1C 6.1 09/27/2015   Lab Results  Component Value Date   INSULIN 49.5 (H) 05/13/2017   INSULIN 52.3 (H) 01/11/2017   INSULIN 80.9 (H) 09/01/2016   CBC    Component Value Date/Time   WBC 7.9 05/13/2017 0933   WBC 10.9 (H) 11/06/2016 1355   RBC 4.38 05/13/2017 0933   RBC 3.95 11/06/2016 1355   HGB 11.3  05/13/2017 0933   HCT 36.9 05/13/2017 0933   PLT 516 (H) 01/11/2017 1205   MCV 84 05/13/2017 0933   MCH 25.8 (L) 05/13/2017 0933   MCH 25.8 (L) 11/06/2016 1355   MCHC 30.6 (L) 05/13/2017 0933   MCHC 31.2 11/06/2016 1355   RDW 19.4 (H) 05/13/2017 0933   LYMPHSABS 3.4 (H) 05/13/2017 0933   MONOABS 0.4 11/23/2014 1339   EOSABS 0.4 05/13/2017 0933   BASOSABS 0.0  05/13/2017 0933   Iron/TIBC/Ferritin/ %Sat    Component Value Date/Time   IRON 63 05/19/2016 1105   TIBC 343 05/19/2016 1105   FERRITIN 34 05/19/2016 1105   IRONPCTSAT 18 05/19/2016 1105   Lipid Panel     Component Value Date/Time   CHOL 158 05/13/2017 0933   TRIG 70 05/13/2017 0933   HDL 65 05/13/2017 0933   CHOLHDL 3 09/27/2015 1010   VLDL 21.6 09/27/2015 1010   LDLCALC 79 05/13/2017 0933   Hepatic Function Panel     Component Value Date/Time   PROT 6.7 05/13/2017 0933   ALBUMIN 3.6 05/13/2017 0933   AST 20 05/13/2017 0933   ALT 23 05/13/2017 0933   ALKPHOS 80 05/13/2017 0933   BILITOT <0.2 05/13/2017 0933      Component Value Date/Time   TSH 1.420 05/19/2016 1053   TSH 0.239 (L) 11/26/2014 1325   Results for SYRIAH, DELISI (MRN [id number]) as of 10/26/2017 10:06  Ref. Range 05/13/2017 09:33  Vitamin D, 25-Hydroxy Latest Ref Range: 30.0 - 100.0 ng/mL 29.8 (L)   ASSESSMENT AND PLAN: Vitamin D deficiency - Plan: Vitamin D, Ergocalciferol, (DRISDOL) 50000 units CAPS capsule  Prediabetes  At risk for osteoporosis  Class 3 severe obesity with serious comorbidity and body mass index (BMI) of 50.0 to 59.9 in adult, unspecified obesity type ([place])  PLAN:  Vitamin D Deficiency Amanda Murphy was informed that low vitamin D levels contributes to fatigue and are associated with obesity, breast, and colon cancer. She agrees to continue to take prescription Vit D @50 ,000 IU every week #4 with no refills and will follow up for routine testing of vitamin D, at least 2-3 times per year. She was informed of the risk of  over-replacement of vitamin D and agrees to not increase her dose unless she discusses this with [place] first. Amanda Murphy agrees to follow up as directed.  At risk for osteopenia and osteoporosis Amanda Murphy is at risk for osteopenia and osteoporosis due to her vitamin D deficiency. She was encouraged to take her vitamin D and follow her higher calcium diet and increase strengthening exercise to help strengthen her bones and decrease her risk of osteopenia and osteoporosis.  Pre-Diabetes Amanda Murphy will continue to work on weight loss, exercise, and decreasing simple carbohydrates in her diet to help decrease the risk of diabetes. We dicussed metformin including benefits and risks. She was informed that eating too many simple carbohydrates or too many calories at one sitting increases the likelihood of GI side effects. Amanda Murphy declined metformin due to previously experienced side effects. Amanda Murphy agreed to follow up with [place] as directed to monitor her progress.  Obesity Amanda Murphy is currently in the action stage of change. As such, her goal is to continue with weight loss efforts She has agreed to follow the Category 3 plan Amanda Murphy has been instructed to work up to a goal of 150 minutes of combined cardio and strengthening exercise per week for weight loss and overall health benefits. We discussed the following Behavioral Modification Strategies today: decrease junk food and planning for success  Amanda Murphy has agreed to follow up with our clinic in 3 weeks. She was informed of the importance of frequent follow up visits to maximize her success with intensive lifestyle modifications for her multiple health conditions.  Corey Skains, am acting as transcriptionist for [employer], PA-C I, Lacy Duverney [facility], have reviewed this note and agree with its content

## 2017-11-16 ENCOUNTER — Ambulatory Visit (INDEPENDENT_AMBULATORY_CARE_PROVIDER_SITE_OTHER): Payer: 59 | Admitting: Physician Assistant

## 2017-11-16 VITALS — BP 127/83 | HR 77 | Temp 98.5°F | Ht 64.0 in | Wt 332.0 lb

## 2017-11-16 DIAGNOSIS — R7303 Prediabetes: Secondary | ICD-10-CM

## 2017-11-16 DIAGNOSIS — Z6841 Body Mass Index (BMI) 40.0 and over, adult: Secondary | ICD-10-CM

## 2017-11-16 DIAGNOSIS — Z9189 Other specified personal risk factors, not elsewhere classified: Secondary | ICD-10-CM

## 2017-11-16 DIAGNOSIS — E7849 Other hyperlipidemia: Secondary | ICD-10-CM

## 2017-11-16 DIAGNOSIS — E559 Vitamin D deficiency, unspecified: Secondary | ICD-10-CM | POA: Diagnosis not present

## 2017-11-16 NOTE — Progress Notes (Signed)
Office: [phone]  /  Fax: [phone]   HPI:   Chief Complaint: OBESITY Amanda Murphy is here to discuss her progress with her obesity treatment plan. She is on the Category 3 plan and is following her eating plan approximately 25 % of the time. She states she is doing water aerobics and cardio for 60 minutes 3 times per week. Amanda Murphy has not been following the meal plan and eats out more. She would like travel eating strategies.  Her weight is (!) 332 lb (150.6 kg) today and has gained 5 pounds since her last visit. She has lost 0 lbs since starting treatment with [place].  Vitamin D Deficiency Amanda Murphy has a diagnosis of vitamin D deficiency. She is currently taking prescription Vit D and denies nausea, vomiting or muscle weakness.  Pre-Diabetes Amanda Murphy has a diagnosis of pre-diabetes based on her elevated Hgb A1c and was informed this puts her at greater risk of developing diabetes. She is not on metformin currently and continues to work on diet and exercise to decrease risk of diabetes. She denies polyphagia, nausea, or hypoglycemia.  Hyperlipidemia Amanda Murphy has hyperlipidemia and has been trying to improve her cholesterol levels with intensive lifestyle modification including a low saturated fat diet, exercise and weight loss. She is not on medications and she denies any chest pain, claudication or myalgias.  At risk for cardiovascular disease Amanda Murphy is at a higher than average risk for cardiovascular disease due to obesity and hyperlipidemia. She currently denies any chest pain.  ALLERGIES: No Known Allergies  MEDICATIONS: Current Outpatient Medications on File Prior to Visit  Medication Sig Dispense Refill  . acetaminophen (TYLENOL) 500 MG tablet Take 1,000 mg by mouth every 6 (six) hours as needed for mild pain.    [name] aspirin EC 325 MG tablet Take 325 mg by mouth daily.    [name] buPROPion (WELLBUTRIN SR) 200 MG 12 hr tablet Take 1 tablet (200 mg total) by mouth daily. 30 tablet 0    . calcium carbonate (TUMS - DOSED IN MG ELEMENTAL CALCIUM) 500 MG chewable tablet Chew 1 tablet by mouth as needed. Reported on 07/31/2015    . docusate sodium (COLACE) 100 MG capsule Take 100 mg by mouth daily as needed for mild constipation.    [name] levocetirizine (XYZAL) 5 MG tablet Take 1 tablet (5 mg total) by mouth every evening. (Patient taking differently: Take 5 mg by mouth daily as needed. ) 30 tablet 11  . levonorgestrel (MIRENA) 20 MCG/24HR IUD 1 each by Intrauterine route once.    . Melatonin 3 MG TABS Take by mouth.    . Prenatal Vit-Fe Fumarate-FA (PRENATAL MULTIVITAMIN) TABS tablet Take 1 tablet by mouth daily.     . Vitamin D, Ergocalciferol, (DRISDOL) 50000 units CAPS capsule Take 1 capsule (50,000 Units total) by mouth every 7 (seven) days. 4 capsule 0   No current facility-administered medications on file prior to visit.     PAST MEDICAL HISTORY: Past Medical History:  Diagnosis Date  . Abnormal uterine bleeding (AUB)   . Anemia   . Anxiety   . Asthma    as child - no inhaler - no problem as adult  . Astigmatism   . Fibroids   . H/O diabetes insipidus    s/p craniotomy  . Headache(784.0)    hx - none since surgery  . Hx of blood clots 2016   P. E.  p craniotomy. on Xarelto until 2017.  . Joint pain    ankles and hips  .  PCOS (polycystic ovarian syndrome)   . Pneumonia    Hx 3-4 yrs ago   . Pre-diabetes   . Seasonal allergies   . Vision abnormalities   . Vitamin D deficiency   . Wears glasses     PAST SURGICAL HISTORY: Past Surgical History:  Procedure Laterality Date  . CRANIOTOMY N/A 10/31/2014   Procedure: CRANIOTOMY FOR RESECTION OF TUMOR;  Surgeon: Jovita Gamma, MD;  Location: [facility];  Service: Neurosurgery;  Laterality: N/A;  Craniotomy for resection of tumor  . DILATATION & CURETTAGE/HYSTEROSCOPY WITH MYOSURE N/A 11/12/2016   Procedure: DILATATION & CURETTAGE/HYSTEROSCOPY WITH MYOSURE;  Surgeon: Marylynn Pearson, MD;  Location: [facility];   Service: Gynecology;  Laterality: N/A;  . DILATION AND CURETTAGE OF UTERUS     myomectomy  . MYOMECTOMY     vaginal -   . WISDOM TOOTH EXTRACTION  2006    SOCIAL HISTORY: Social History   Tobacco Use  . Smoking status: Never Smoker  . Smokeless tobacco: Never Used  Substance Use Topics  . Alcohol use: Yes    Alcohol/week: 0.0 oz    Comment: occasional wine  . Drug use: No    FAMILY HISTORY: Family History  Problem Relation Age of Onset  . Hypertension Mother   . Diabetes Mother   . Obesity Mother   . Ovarian cancer Unknown   . Breast cancer Unknown   . Fibroids Unknown     ROS: Review of Systems  Constitutional: Negative for weight loss.  Cardiovascular: Negative for chest pain and claudication.  Gastrointestinal: Negative for nausea and vomiting.  Musculoskeletal: Negative for myalgias.       Negative muscle weakness  Endo/Heme/Allergies:       Negative polyphagia Negative muscle weakness    PHYSICAL EXAM: Blood pressure 127/83, pulse 77, temperature 98.5 F (36.9 C), temperature source Oral, height 5\' 4"  (1.626 m), weight (!) 332 lb (150.6 kg), SpO2 100 %. Body mass index is 56.99 kg/m. Physical Exam  Constitutional: She is oriented to person, place, and time. She appears well-developed and well-nourished.  Cardiovascular: Normal rate.  Pulmonary/Chest: Effort normal.  Musculoskeletal: Normal range of motion.  Neurological: She is oriented to person, place, and time.  Skin: Skin is warm and dry.  Psychiatric: She has a normal mood and affect. Her behavior is normal.  Vitals reviewed.   RECENT LABS AND TESTS: BMET    Component Value Date/Time   NA 141 05/13/2017 0933   K 4.5 05/13/2017 0933   CL 102 05/13/2017 0933   CO2 26 05/13/2017 0933   GLUCOSE 90 05/13/2017 0933   GLUCOSE 92 11/06/2016 1355   BUN 8 05/13/2017 0933   CREATININE 0.98 05/13/2017 0933   CALCIUM 9.3 05/13/2017 0933   GFRNONAA 74 05/13/2017 0933   GFRAA 85 05/13/2017 0933    Lab Results  Component Value Date   HGBA1C 5.9 (H) 05/13/2017   HGBA1C 5.5 01/11/2017   HGBA1C 5.6 09/01/2016   HGBA1C 5.8 (H) 05/19/2016   HGBA1C 6.1 09/27/2015   Lab Results  Component Value Date   INSULIN 49.5 (H) 05/13/2017   INSULIN 52.3 (H) 01/11/2017   INSULIN 80.9 (H) 09/01/2016   CBC    Component Value Date/Time   WBC 7.9 05/13/2017 0933   WBC 10.9 (H) 11/06/2016 1355   RBC 4.38 05/13/2017 0933   RBC 3.95 11/06/2016 1355   HGB 11.3 05/13/2017 0933   HCT 36.9 05/13/2017 0933   PLT 516 (H) 01/11/2017 1205   MCV 84  05/13/2017 0933   MCH 25.8 (L) 05/13/2017 0933   MCH 25.8 (L) 11/06/2016 1355   MCHC 30.6 (L) 05/13/2017 0933   MCHC 31.2 11/06/2016 1355   RDW 19.4 (H) 05/13/2017 0933   LYMPHSABS 3.4 (H) 05/13/2017 0933   MONOABS 0.4 11/23/2014 1339   EOSABS 0.4 05/13/2017 0933   BASOSABS 0.0 05/13/2017 0933   Iron/TIBC/Ferritin/ %Sat    Component Value Date/Time   IRON 63 05/19/2016 1105   TIBC 343 05/19/2016 1105   FERRITIN 34 05/19/2016 1105   IRONPCTSAT 18 05/19/2016 1105   Lipid Panel     Component Value Date/Time   CHOL 158 05/13/2017 0933   TRIG 70 05/13/2017 0933   HDL 65 05/13/2017 0933   CHOLHDL 3 09/27/2015 1010   VLDL 21.6 09/27/2015 1010   LDLCALC 79 05/13/2017 0933   Hepatic Function Panel     Component Value Date/Time   PROT 6.7 05/13/2017 0933   ALBUMIN 3.6 05/13/2017 0933   AST 20 05/13/2017 0933   ALT 23 05/13/2017 0933   ALKPHOS 80 05/13/2017 0933   BILITOT <0.2 05/13/2017 0933      Component Value Date/Time   TSH 1.420 05/19/2016 1053   TSH 0.239 (L) 11/26/2014 1325  Results for BREYONA, SWANDER (MRN [id number]) as of 11/16/2017 12:26  Ref. Range 05/13/2017 09:33  Vitamin D, 25-Hydroxy Latest Ref Range: 30.0 - 100.0 ng/mL 29.8 (L)    ASSESSMENT AND PLAN: Vitamin D deficiency - Plan: VITAMIN D 25 Hydroxy (Vit-D Deficiency, Fractures)  Prediabetes - Plan: Comprehensive metabolic panel, Hemoglobin A1c, Insulin,  random  Other hyperlipidemia - Plan: Lipid Panel With LDL/HDL Ratio  At risk for heart disease  Class 3 severe obesity with serious comorbidity and body mass index (BMI) of 50.0 to 59.9 in adult, unspecified obesity type ([place])  PLAN:  Vitamin D Deficiency Amanda Murphy was informed that low vitamin D levels contributes to fatigue and are associated with obesity, breast, and colon cancer. Amanda Murphy agrees to continue taking prescription Vit D @50 ,000 IU every week and will follow up for routine testing of vitamin D, at least 2-3 times per year. She was informed of the risk of over-replacement of vitamin D and agrees to not increase her dose unless she discusses this with [place] first. We will check labs and Amanda Murphy agrees to follow up with our clinic in 2 weeks.  Pre-Diabetes Amanda Murphy will continue to work on weight loss, diet, exercise, and decreasing simple carbohydrates in her diet to help decrease the risk of diabetes. We dicussed metformin including benefits and risks. She was informed that eating too many simple carbohydrates or too many calories at one sitting increases the likelihood of GI side effects. Amanda Murphy declined metformin for now and a prescription was not written today. We will check labs and Amanda Murphy agrees to follow up with our clinic in 2 weeks as directed to monitor her progress.  Hyperlipidemia Amanda Murphy was informed of the American Heart Association Guidelines emphasizing intensive lifestyle modifications as the first line treatment for hyperlipidemia. We discussed many lifestyle modifications today in depth, and Amanda Murphy will continue to work on decreasing saturated fats such as fatty red meat, butter and many fried foods. She will also increase vegetables and lean protein in her diet and continue to work on diet, exercise, and weight loss efforts. We will check labs and Amanda Murphy agrees to follow up with our clinic in 2 weeks.  Cardiovascular risk counselling Amanda Murphy was given  extended (15 minutes) coronary artery disease prevention counseling today.  She is 37 y.o. female and has risk factors for heart disease including obesity and hyperlipidemia. We discussed intensive lifestyle modifications today with an emphasis on specific weight loss instructions and strategies. Pt was also informed of the importance of increasing exercise and decreasing saturated fats to help prevent heart disease.  Obesity Amanda Murphy is currently in the action stage of change. As such, her goal is to continue with weight loss efforts She has agreed to keep a food journal with 500-600 calories and 40 grams of protein at supper daily and follow the Category 3 plan Amanda Murphy has been instructed to work up to a goal of 150 minutes of combined cardio and strengthening exercise per week for weight loss and overall health benefits. We discussed the following Behavioral Modification Strategies today: decrease eating out, travel eating strategies, and keep a strict food journal    Amanda Murphy has agreed to follow up with our clinic in 2 weeks. She was informed of the importance of frequent follow up visits to maximize her success with intensive lifestyle modifications for her multiple health conditions.   Wilhemena Durie, am acting as transcriptionist for Lacy Duverney, PA-C  I, Lacy Duverney [facility], have reviewed this note and agree with its content

## 2017-11-17 LAB — COMPREHENSIVE METABOLIC PANEL
A/G RATIO: 1.2 (ref 1.2–2.2)
ALBUMIN: 3.8 g/dL (ref 3.5–5.5)
ALK PHOS: 87 IU/L (ref 39–117)
ALT: 24 IU/L (ref 0–32)
AST: 16 IU/L (ref 0–40)
BILIRUBIN TOTAL: 0.5 mg/dL (ref 0.0–1.2)
BUN / CREAT RATIO: 10 (ref 9–23)
BUN: 9 mg/dL (ref 6–20)
CHLORIDE: 101 mmol/L (ref 96–106)
CO2: 25 mmol/L (ref 20–29)
Calcium: 8.9 mg/dL (ref 8.7–10.2)
Creatinine, Ser: 0.86 mg/dL (ref 0.57–1.00)
GFR calc Af Amer: 99 mL/min/{1.73_m2} (ref >59)
GFR calc non Af Amer: 86 mL/min/{1.73_m2} (ref >59)
GLOBULIN, TOTAL: 3.2 g/dL (ref 1.5–4.5)
GLUCOSE: 83 mg/dL (ref 65–99)
POTASSIUM: 4.3 mmol/L (ref 3.5–5.2)
SODIUM: 141 mmol/L (ref 134–144)
TOTAL PROTEIN: 7 g/dL (ref 6.0–8.5)

## 2017-11-17 LAB — LIPID PANEL WITH LDL/HDL RATIO
Cholesterol, Total: 165 mg/dL (ref 100–199)
HDL: 65 mg/dL (ref >39)
LDL CALC: 85 mg/dL (ref 0–99)
LDL/HDL RATIO: 1.3 ratio (ref 0.0–3.2)
Triglycerides: 76 mg/dL (ref 0–149)
VLDL CHOLESTEROL CAL: 15 mg/dL (ref 5–40)

## 2017-11-17 LAB — INSULIN, RANDOM: INSULIN: 53.1 u[IU]/mL — ABNORMAL HIGH (ref 2.6–24.9)

## 2017-11-17 LAB — VITAMIN D 25 HYDROXY (VIT D DEFICIENCY, FRACTURES): VIT D 25 HYDROXY: 30.3 ng/mL (ref 30.0–100.0)

## 2017-11-17 LAB — HEMOGLOBIN A1C
Est. average glucose Bld gHb Est-mCnc: 126 mg/dL
Hgb A1c MFr Bld: 6 % — ABNORMAL HIGH (ref 4.8–5.6)

## 2017-11-30 ENCOUNTER — Ambulatory Visit (INDEPENDENT_AMBULATORY_CARE_PROVIDER_SITE_OTHER): Payer: 59 | Admitting: Physician Assistant

## 2017-11-30 VITALS — BP 104/72 | HR 100 | Temp 98.4°F | Ht 64.0 in | Wt 334.0 lb

## 2017-11-30 DIAGNOSIS — R7303 Prediabetes: Secondary | ICD-10-CM

## 2017-11-30 DIAGNOSIS — Z6841 Body Mass Index (BMI) 40.0 and over, adult: Secondary | ICD-10-CM

## 2017-11-30 NOTE — Progress Notes (Signed)
Office: [phone]  /  Fax: [phone]   HPI:   Chief Complaint: OBESITY Amanda Murphy is here to discuss her progress with her obesity treatment plan. She is on the keep a food journal with 500-600 calories and 40 grams of protein at supper daily and follow the Category 3 plan and is following her eating plan approximately 50 % of the time. She states she is doing water aerobics and walking for 60 minutes 3 times per week. Jessalyn traveled out of town and has not been mindful of her eating. She continues to have challenges with pre-planning her meals.  Her weight is (!) 334 lb (151.5 kg) today and has gained 2 pounds since her last visit. She has lost 0 lbs since starting treatment with [place].  Pre-Diabetes Amanda Murphy has a diagnosis of pre-diabetes based on her elevated Hgb A1c and was informed this puts her at greater risk of developing diabetes. A1c increased to 6.0, she denies polyphagia. She denies metformin, states she "felt bad" with it in the past and her BGs dropped. Have tried GIPI in the past but due to cost, have not continued with them. She continues to work on diet and exercise to decrease risk of diabetes. She denies nausea or hypoglycemia.  ALLERGIES: No Known Allergies  MEDICATIONS: Current Outpatient Medications on File Prior to Visit  Medication Sig Dispense Refill  . acetaminophen (TYLENOL) 500 MG tablet Take 1,000 mg by mouth every 6 (six) hours as needed for mild pain.    [name] aspirin EC 325 MG tablet Take 325 mg by mouth daily.    [name] buPROPion (WELLBUTRIN SR) 200 MG 12 hr tablet Take 1 tablet (200 mg total) by mouth daily. 30 tablet 0  . calcium carbonate (TUMS - DOSED IN MG ELEMENTAL CALCIUM) 500 MG chewable tablet Chew 1 tablet by mouth as needed. Reported on 07/31/2015    . docusate sodium (COLACE) 100 MG capsule Take 100 mg by mouth daily as needed for mild constipation.    [name] levocetirizine (XYZAL) 5 MG tablet Take 1 tablet (5 mg total) by mouth every evening. (Patient  taking differently: Take 5 mg by mouth daily as needed. ) 30 tablet 11  . levonorgestrel (MIRENA) 20 MCG/24HR IUD 1 each by Intrauterine route once.    . Melatonin 3 MG TABS Take by mouth.    . Prenatal Vit-Fe Fumarate-FA (PRENATAL MULTIVITAMIN) TABS tablet Take 1 tablet by mouth daily.     . Vitamin D, Ergocalciferol, (DRISDOL) 50000 units CAPS capsule Take 1 capsule (50,000 Units total) by mouth every 7 (seven) days. 4 capsule 0   No current facility-administered medications on file prior to visit.     PAST MEDICAL HISTORY: Past Medical History:  Diagnosis Date  . Abnormal uterine bleeding (AUB)   . Anemia   . Anxiety   . Asthma    as child - no inhaler - no problem as adult  . Astigmatism   . Fibroids   . H/O diabetes insipidus    s/p craniotomy  . Headache(784.0)    hx - none since surgery  . Hx of blood clots 2016   P. E.  p craniotomy. on Xarelto until 2017.  . Joint pain    ankles and hips  . PCOS (polycystic ovarian syndrome)   . Pneumonia    Hx 3-4 yrs ago   . Pre-diabetes   . Seasonal allergies   . Vision abnormalities   . Vitamin D deficiency   . Wears glasses  PAST SURGICAL HISTORY: Past Surgical History:  Procedure Laterality Date  . CRANIOTOMY N/A 10/31/2014   Procedure: CRANIOTOMY FOR RESECTION OF TUMOR;  Surgeon: Jovita Gamma, MD;  Location: [place] [facility];  Service: Neurosurgery;  Laterality: N/A;  Craniotomy for resection of tumor  . DILATATION & CURETTAGE/HYSTEROSCOPY WITH MYOSURE N/A 11/12/2016   Procedure: DILATATION & CURETTAGE/HYSTEROSCOPY WITH MYOSURE;  Surgeon: Marylynn Pearson, MD;  Location: [place] [facility];  Service: Gynecology;  Laterality: N/A;  . DILATION AND CURETTAGE OF UTERUS     myomectomy  . MYOMECTOMY     vaginal -   . WISDOM TOOTH EXTRACTION  2006    SOCIAL HISTORY: Social History   Tobacco Use  . Smoking status: Never Smoker  . Smokeless tobacco: Never Used  Substance Use Topics  . Alcohol use: Yes    Alcohol/week: 0.0 oz     Comment: occasional wine  . Drug use: No    FAMILY HISTORY: Family History  Problem Relation Age of Onset  . Hypertension Mother   . Diabetes Mother   . Obesity Mother   . Ovarian cancer Unknown   . Breast cancer Unknown   . Fibroids Unknown     ROS: Review of Systems  Constitutional: Negative for weight loss.  Gastrointestinal: Negative for nausea.  Endo/Heme/Allergies:       Negative polyphagia Negative hypoglycemia    PHYSICAL EXAM: Blood pressure 104/72, pulse 100, temperature 98.4 F (36.9 C), temperature source Oral, height 5\' 4"  (1.626 m), weight (!) 334 lb (151.5 kg), SpO2 98 %. Body mass index is 57.33 kg/m. Physical Exam  Constitutional: She is oriented to person, place, and time. She appears well-developed and well-nourished.  Cardiovascular: Normal rate.  Pulmonary/Chest: Effort normal.  Musculoskeletal: Normal range of motion.  Neurological: She is oriented to person, place, and time.  Skin: Skin is warm and dry.  Psychiatric: She has a normal mood and affect. Her behavior is normal.  Vitals reviewed.   RECENT LABS AND TESTS: BMET    Component Value Date/Time   NA 141 11/16/2017 1141   K 4.3 11/16/2017 1141   CL 101 11/16/2017 1141   CO2 25 11/16/2017 1141   GLUCOSE 83 11/16/2017 1141   GLUCOSE 92 11/06/2016 1355   BUN 9 11/16/2017 1141   CREATININE 0.86 11/16/2017 1141   CALCIUM 8.9 11/16/2017 1141   GFRNONAA 86 11/16/2017 1141   GFRAA 99 11/16/2017 1141   Lab Results  Component Value Date   HGBA1C 6.0 (H) 11/16/2017   HGBA1C 5.9 (H) 05/13/2017   HGBA1C 5.5 01/11/2017   HGBA1C 5.6 09/01/2016   HGBA1C 5.8 (H) 05/19/2016   Lab Results  Component Value Date   INSULIN 53.1 (H) 11/16/2017   INSULIN 49.5 (H) 05/13/2017   INSULIN 52.3 (H) 01/11/2017   INSULIN 80.9 (H) 09/01/2016   CBC    Component Value Date/Time   WBC 7.9 05/13/2017 0933   WBC 10.9 (H) 11/06/2016 1355   RBC 4.38 05/13/2017 0933   RBC 3.95 11/06/2016 1355   HGB 11.3  05/13/2017 0933   HCT 36.9 05/13/2017 0933   PLT 516 (H) 01/11/2017 1205   MCV 84 05/13/2017 0933   MCH 25.8 (L) 05/13/2017 0933   MCH 25.8 (L) 11/06/2016 1355   MCHC 30.6 (L) 05/13/2017 0933   MCHC 31.2 11/06/2016 1355   RDW 19.4 (H) 05/13/2017 0933   LYMPHSABS 3.4 (H) 05/13/2017 0933   MONOABS 0.4 11/23/2014 1339   EOSABS 0.4 05/13/2017 0933   BASOSABS 0.0 05/13/2017 0933  Iron/TIBC/Ferritin/ %Sat    Component Value Date/Time   IRON 63 05/19/2016 1105   TIBC 343 05/19/2016 1105   FERRITIN 34 05/19/2016 1105   IRONPCTSAT 18 05/19/2016 1105   Lipid Panel     Component Value Date/Time   CHOL 165 11/16/2017 1141   TRIG 76 11/16/2017 1141   HDL 65 11/16/2017 1141   CHOLHDL 3 09/27/2015 1010   VLDL 21.6 09/27/2015 1010   LDLCALC 85 11/16/2017 1141   Hepatic Function Panel     Component Value Date/Time   PROT 7.0 11/16/2017 1141   ALBUMIN 3.8 11/16/2017 1141   AST 16 11/16/2017 1141   ALT 24 11/16/2017 1141   ALKPHOS 87 11/16/2017 1141   BILITOT 0.5 11/16/2017 1141      Component Value Date/Time   TSH 1.420 05/19/2016 1053   TSH 0.239 (L) 11/26/2014 1325    ASSESSMENT AND PLAN: Prediabetes  Class 3 severe obesity with serious comorbidity and body mass index (BMI) of 50.0 to 59.9 in adult, unspecified obesity type ([place])  PLAN:  Pre-Diabetes Shaddai will continue to work on weight loss, diet, exercise, and decreasing simple carbohydrates in her diet to help decrease the risk of diabetes. We dicussed metformin including benefits and risks. She was informed that eating too many simple carbohydrates or too many calories at one sitting increases the likelihood of GI side effects. Alyvia declined metformin for now and a prescription was not written today. Annlouise agrees to follow up with our clinic in 2 weeks as directed to monitor her progress.  We spent > than 50% of the 15 minute visit on the counseling as documented in the note.  Obesity Alara is currently  in the action stage of change. As such, her goal is to continue with weight loss efforts She has agreed to portion control better and make smarter food choices, such as increase vegetables and decrease simple carbohydrates  Jakyrah has been instructed to work up to a goal of 150 minutes of combined cardio and strengthening exercise per week for weight loss and overall health benefits. We discussed the following Behavioral Modification Strategies today: increasing lean protein intake and work on meal planning and easy cooking plans   Malachi has agreed to follow up with our clinic in 2 weeks. She was informed of the importance of frequent follow up visits to maximize her success with intensive lifestyle modifications for her multiple health conditions.   Wilhemena Durie, am acting as transcriptionist for Lacy Duverney, PA-C I, Lacy Duverney [facility], have reviewed this note and agree with its content

## 2017-12-16 ENCOUNTER — Encounter: Payer: Self-pay | Admitting: Internal Medicine

## 2017-12-16 ENCOUNTER — Ambulatory Visit: Payer: 59 | Admitting: Internal Medicine

## 2017-12-16 DIAGNOSIS — L0291 Cutaneous abscess, unspecified: Secondary | ICD-10-CM | POA: Diagnosis not present

## 2017-12-16 MED ORDER — SULFAMETHOXAZOLE-TRIMETHOPRIM 800-160 MG PO TABS: 1.0000 | ORAL_TABLET | Freq: Two times a day (BID) | ORAL | 0 refills | Status: DC

## 2017-12-16 MED FILL — VIT D2 1.25 MG (50,000 UNIT: 1.25 MG | 28 days supply | Qty: 4 | Fill #0

## 2017-12-16 MED FILL — SULFAMETHOXAZOLE-TMP DS TAB: 800-160 | 7 days supply | Qty: 14 | Fill #0

## 2017-12-16 NOTE — Progress Notes (Signed)
   Subjective:    Patient ID: Amanda Murphy, female    DOB: 13-Sep-1980, 37 y.o.   MRN: [id number]  HPI The patient is a 37 YO female coming in for swelling and lesion under her left arm. Started about 2 days ago. Overall gradually worsening since she noticed it. It is painful to touch and some red. She denies fevers or chills. She is worried about if her deodorant if the cause. She denies drainage. Has not taken anything for it.   Review of Systems  Constitutional: Negative.   Respiratory: Negative for cough, chest tightness and shortness of breath.   Cardiovascular: Negative for chest pain, palpitations and leg swelling.  Gastrointestinal: Negative for abdominal distention, abdominal pain, constipation, diarrhea, nausea and vomiting.  Musculoskeletal: Positive for myalgias.  Skin: Positive for color change.  Neurological: Negative.   Psychiatric/Behavioral: Negative.       Objective:   Physical Exam  Constitutional: She is oriented to person, place, and time. She appears well-developed and well-nourished.  HENT:  Head: Normocephalic and atraumatic.  Eyes: EOM are normal.  Neck: Normal range of motion.  Cardiovascular: Normal rate and regular rhythm.  Pulmonary/Chest: Effort normal and breath sounds normal. No respiratory distress. She has no wheezes. She has no rales.  Abdominal: Soft.  Musculoskeletal: She exhibits no edema.  Neurological: She is alert and oriented to person, place, and time. Coordination normal.  Skin: Skin is warm and dry. Rash noted.  Under left armpit there is an obstructed sweat gland, minimal fluctuance, some surrounding erythema and tenderness to palpation   Vitals:   12/16/17 1444  BP: 130/82  Pulse: 87  Temp: 98.1 F (36.7 C)  TempSrc: Oral  SpO2: 99%  Weight: (!) 337 lb (152.9 kg)  Height: 5\' 4"  (1.626 m)      Assessment & Plan:

## 2017-12-16 NOTE — Patient Instructions (Signed)
We have sent in bactrim to take 1 pill twice a day for 1 week. °

## 2017-12-17 ENCOUNTER — Encounter: Payer: Self-pay | Admitting: Internal Medicine

## 2017-12-17 DIAGNOSIS — S21109A Unspecified open wound of unspecified front wall of thorax without penetration into thoracic cavity, initial encounter: Secondary | ICD-10-CM | POA: Insufficient documentation

## 2017-12-17 DIAGNOSIS — L0291 Cutaneous abscess, unspecified: Secondary | ICD-10-CM | POA: Insufficient documentation

## 2017-12-17 NOTE — Assessment & Plan Note (Signed)
Rx for bactrim 1 week therapy. Not amenable to I and D at this time.

## 2017-12-21 ENCOUNTER — Ambulatory Visit (INDEPENDENT_AMBULATORY_CARE_PROVIDER_SITE_OTHER): Payer: 59 | Admitting: Physician Assistant

## 2017-12-21 VITALS — BP 111/77 | HR 90 | Temp 97.8°F | Ht 64.0 in | Wt 335.0 lb

## 2017-12-21 DIAGNOSIS — Z6841 Body Mass Index (BMI) 40.0 and over, adult: Secondary | ICD-10-CM

## 2017-12-21 DIAGNOSIS — E559 Vitamin D deficiency, unspecified: Secondary | ICD-10-CM | POA: Diagnosis not present

## 2017-12-21 NOTE — Progress Notes (Signed)
Office: [phone]  /  Fax: [phone]   HPI:   Chief Complaint: OBESITY Amanda Murphy is here to discuss her progress with her obesity treatment plan. She is on the portion control better and make smarter food choices plan and is following her eating plan approximately 75 % of the time. She states she is walking and doing water aerobics 60 minutes 4 times per week. Amanda Murphy continues to struggle with preplanning her meals. She states she was out of town and got off track with her eating. Her weight is (!) 335 lb (152 kg) today and has had a weight gain of 1 pound over a period of 3 weeks since her last visit. She has gained 11 lbs since starting treatment with [place].  Vitamin D deficiency Amanda Murphy has a diagnosis of vitamin D deficiency. She is currently taking vit D and denies nausea, vomiting or muscle weakness.  ALLERGIES: No Known Allergies  MEDICATIONS: Current Outpatient Medications on File Prior to Visit  Medication Sig Dispense Refill  . acetaminophen (TYLENOL) 500 MG tablet Take 1,000 mg by mouth every 6 (six) hours as needed for mild pain.    [name] aspirin EC 325 MG tablet Take 325 mg by mouth daily.    [name] buPROPion (WELLBUTRIN SR) 200 MG 12 hr tablet Take 1 tablet (200 mg total) by mouth daily. 30 tablet 0  . calcium carbonate (TUMS - DOSED IN MG ELEMENTAL CALCIUM) 500 MG chewable tablet Chew 1 tablet by mouth as needed. Reported on 07/31/2015    . docusate sodium (COLACE) 100 MG capsule Take 100 mg by mouth daily as needed for mild constipation.    [name] levocetirizine (XYZAL) 5 MG tablet Take 1 tablet (5 mg total) by mouth every evening. (Patient taking differently: Take 5 mg by mouth daily as needed. ) 30 tablet 11  . levonorgestrel (MIRENA) 20 MCG/24HR IUD 1 each by Intrauterine route once.    . Melatonin 3 MG TABS Take by mouth.    . Prenatal Vit-Fe Fumarate-FA (PRENATAL MULTIVITAMIN) TABS tablet Take 1 tablet by mouth daily.     [name] sulfamethoxazole-trimethoprim (BACTRIM DS,SEPTRA  DS) 800-160 MG tablet Take 1 tablet by mouth 2 (two) times daily. 14 tablet 0  . Vitamin D, Ergocalciferol, (DRISDOL) 50000 units CAPS capsule Take 1 capsule (50,000 Units total) by mouth every 7 (seven) days. 4 capsule 0   No current facility-administered medications on file prior to visit.     PAST MEDICAL HISTORY: Past Medical History:  Diagnosis Date  . Abnormal uterine bleeding (AUB)   . Anemia   . Anxiety   . Asthma    as child - no inhaler - no problem as adult  . Astigmatism   . Fibroids   . H/O diabetes insipidus    s/p craniotomy  . Headache(784.0)    hx - none since surgery  . Hx of blood clots 2016   P. E.  p craniotomy. on Xarelto until 2017.  . Joint pain    ankles and hips  . PCOS (polycystic ovarian syndrome)   . Pneumonia    Hx 3-4 yrs ago   . Pre-diabetes   . Seasonal allergies   . Vision abnormalities   . Vitamin D deficiency   . Wears glasses     PAST SURGICAL HISTORY: Past Surgical History:  Procedure Laterality Date  . CRANIOTOMY N/A 10/31/2014   Procedure: CRANIOTOMY FOR RESECTION OF TUMOR;  Surgeon: Jovita Gamma, MD;  Location: [place] [facility];  Service: Neurosurgery;  Laterality:  N/A;  Craniotomy for resection of tumor  . DILATATION & CURETTAGE/HYSTEROSCOPY WITH MYOSURE N/A 11/12/2016   Procedure: DILATATION & CURETTAGE/HYSTEROSCOPY WITH MYOSURE;  Surgeon: Marylynn Pearson, MD;  Location: [place] [facility];  Service: Gynecology;  Laterality: N/A;  . DILATION AND CURETTAGE OF UTERUS     myomectomy  . MYOMECTOMY     vaginal -   . WISDOM TOOTH EXTRACTION  2006    SOCIAL HISTORY: Social History   Tobacco Use  . Smoking status: Never Smoker  . Smokeless tobacco: Never Used  Substance Use Topics  . Alcohol use: Yes    Alcohol/week: 0.0 oz    Comment: occasional wine  . Drug use: No    FAMILY HISTORY: Family History  Problem Relation Age of Onset  . Hypertension Mother   . Diabetes Mother   . Obesity Mother   . Ovarian cancer Unknown   . Breast  cancer Unknown   . Fibroids Unknown     ROS: Review of Systems  Constitutional: Negative for weight loss.  Gastrointestinal: Negative for nausea and vomiting.  Musculoskeletal:       Negative for muscle weakness    PHYSICAL EXAM: Blood pressure 111/77, pulse 90, temperature 97.8 F (36.6 C), temperature source Oral, height 5\' 4"  (1.626 Murphy), weight (!) 335 lb (152 kg), SpO2 95 %. Body mass index is 57.5 kg/Murphy. Physical Exam  Constitutional: She is oriented to person, place, and time. She appears well-developed and well-nourished.  Cardiovascular: Normal rate.  Pulmonary/Chest: Effort normal.  Musculoskeletal: Normal range of motion.  Neurological: She is oriented to person, place, and time.  Skin: Skin is warm and dry.  Psychiatric: She has a normal mood and affect. Her behavior is normal.  Vitals reviewed.   RECENT LABS AND TESTS: BMET    Component Value Date/Time   NA 141 11/16/2017 1141   K 4.3 11/16/2017 1141   CL 101 11/16/2017 1141   CO2 25 11/16/2017 1141   GLUCOSE 83 11/16/2017 1141   GLUCOSE 92 11/06/2016 1355   BUN 9 11/16/2017 1141   CREATININE 0.86 11/16/2017 1141   CALCIUM 8.9 11/16/2017 1141   GFRNONAA 86 11/16/2017 1141   GFRAA 99 11/16/2017 1141   Lab Results  Component Value Date   HGBA1C 6.0 (H) 11/16/2017   HGBA1C 5.9 (H) 05/13/2017   HGBA1C 5.5 01/11/2017   HGBA1C 5.6 09/01/2016   HGBA1C 5.8 (H) 05/19/2016   Lab Results  Component Value Date   INSULIN 53.1 (H) 11/16/2017   INSULIN 49.5 (H) 05/13/2017   INSULIN 52.3 (H) 01/11/2017   INSULIN 80.9 (H) 09/01/2016   CBC    Component Value Date/Time   WBC 7.9 05/13/2017 0933   WBC 10.9 (H) 11/06/2016 1355   RBC 4.38 05/13/2017 0933   RBC 3.95 11/06/2016 1355   HGB 11.3 05/13/2017 0933   HCT 36.9 05/13/2017 0933   PLT 516 (H) 01/11/2017 1205   MCV 84 05/13/2017 0933   MCH 25.8 (L) 05/13/2017 0933   MCH 25.8 (L) 11/06/2016 1355   MCHC 30.6 (L) 05/13/2017 0933   MCHC 31.2 11/06/2016  1355   RDW 19.4 (H) 05/13/2017 0933   LYMPHSABS 3.4 (H) 05/13/2017 0933   MONOABS 0.4 11/23/2014 1339   EOSABS 0.4 05/13/2017 0933   BASOSABS 0.0 05/13/2017 0933   Iron/TIBC/Ferritin/ %Sat    Component Value Date/Time   IRON 63 05/19/2016 1105   TIBC 343 05/19/2016 1105   FERRITIN 34 05/19/2016 1105   IRONPCTSAT 18 05/19/2016 1105   Lipid  Panel     Component Value Date/Time   CHOL 165 11/16/2017 1141   TRIG 76 11/16/2017 1141   HDL 65 11/16/2017 1141   CHOLHDL 3 09/27/2015 1010   VLDL 21.6 09/27/2015 1010   LDLCALC 85 11/16/2017 1141   Hepatic Function Panel     Component Value Date/Time   PROT 7.0 11/16/2017 1141   ALBUMIN 3.8 11/16/2017 1141   AST 16 11/16/2017 1141   ALT 24 11/16/2017 1141   ALKPHOS 87 11/16/2017 1141   BILITOT 0.5 11/16/2017 1141      Component Value Date/Time   TSH 1.420 05/19/2016 1053   TSH 0.239 (L) 11/26/2014 1325   Results for Amanda Murphy, Amanda Murphy "Shara Blazing" (MRN [id number]) as of 12/21/2017 14:34  Ref. Range 11/16/2017 11:41  Vitamin D, 25-Hydroxy Latest Ref Range: 30.0 - 100.0 ng/mL 30.3   ASSESSMENT AND PLAN: Vitamin D deficiency  Class 3 severe obesity with serious comorbidity and body mass index (BMI) of 50.0 to 59.9 in adult, unspecified obesity type ([place])  PLAN:  Vitamin D Deficiency Amanda Murphy was informed that low vitamin D levels contributes to fatigue and are associated with obesity, breast, and colon cancer. She agrees to continue to take prescription Vit D @50 ,000 IU every week and will follow up for routine testing of vitamin D, at least 2-3 times per year. She was informed of the risk of over-replacement of vitamin D and agrees to not increase her dose unless she discusses this with [place] first.  We spent > than 50% of the 15 minute visit on the counseling as documented in the note.  Obesity Amanda Murphy is currently in the action stage of change. As such, her goal is to continue with weight loss efforts She has agreed to portion  control better and make smarter food choices, such as increase vegetables and decrease simple carbohydrates  Amanda Murphy has been instructed to work up to a goal of 150 minutes of combined cardio and strengthening exercise per week for weight loss and overall health benefits. We discussed the following Behavioral Modification Strategies today: increasing lean protein intake and work on meal planning and easy cooking plans  Terah has agreed to follow up with our clinic in 2 to 3 weeks. She was informed of the importance of frequent follow up visits to maximize her success with intensive lifestyle modifications for her multiple health conditions.  Corey Skains, am acting as transcriptionist for [employer], PA-C I, Lacy Duverney [facility], have reviewed this note and agree with its content

## 2018-01-05 ENCOUNTER — Ambulatory Visit (INDEPENDENT_AMBULATORY_CARE_PROVIDER_SITE_OTHER): Payer: Self-pay | Admitting: Family Medicine

## 2018-01-11 ENCOUNTER — Ambulatory Visit (INDEPENDENT_AMBULATORY_CARE_PROVIDER_SITE_OTHER): Payer: 59 | Admitting: Family Medicine

## 2018-01-11 VITALS — BP 126/73 | HR 84 | Temp 98.2°F | Ht 64.0 in | Wt 336.0 lb

## 2018-01-11 DIAGNOSIS — E559 Vitamin D deficiency, unspecified: Secondary | ICD-10-CM | POA: Diagnosis not present

## 2018-01-11 DIAGNOSIS — Z6841 Body Mass Index (BMI) 40.0 and over, adult: Secondary | ICD-10-CM | POA: Diagnosis not present

## 2018-01-12 MED ORDER — VITAMIN D (ERGOCALCIFEROL) 1.25 MG (50000 UNIT) PO CAPS: 50000.0000 [IU] | ORAL_CAPSULE | ORAL | 0 refills | Status: DC

## 2018-01-12 MED FILL — VIT D2 1.25 MG (50,000 UNIT: 1.25 MG | 30 days supply | Qty: 10 | Fill #0

## 2018-01-13 NOTE — Progress Notes (Signed)
Office: [phone]  /  Fax: [phone]   HPI:   Chief Complaint: OBESITY Amanda Murphy is here to discuss her progress with her obesity treatment plan. She is on the portion control better and make smarter food choices plan and is following her eating plan approximately 50 % of the time. She states she is walking 20 to 60 minutes 3 to 4 times per week. Amanda Murphy is still struggling with increased cravings and emotional eating.Amanda Murphy is not doing well with journaling. Her weight is (!) 336 lb (152.4 kg) today and has had a weight gain of 1 pound over a period of 3 weeks since her last visit. She has gained 12 lbs since starting treatment with [place].  Vitamin D deficiency Amanda Murphy has a diagnosis of vitamin D deficiency. Amanda Murphy is on prescription vit D and is not yet at goal. She admits fatigue and denies nausea, vomiting or muscle weakness.  ALLERGIES: No Known Allergies  MEDICATIONS: Current Outpatient Medications on File Prior to Visit  Medication Sig Dispense Refill  . acetaminophen (TYLENOL) 500 MG tablet Take 1,000 mg by mouth every 6 (six) hours as needed for mild pain.    [name] aspirin EC 325 MG tablet Take 325 mg by mouth daily.    [name] buPROPion (WELLBUTRIN SR) 200 MG 12 hr tablet Take 1 tablet (200 mg total) by mouth daily. 30 tablet 0  . calcium carbonate (TUMS - DOSED IN MG ELEMENTAL CALCIUM) 500 MG chewable tablet Chew 1 tablet by mouth as needed. Reported on 07/31/2015    . docusate sodium (COLACE) 100 MG capsule Take 100 mg by mouth daily as needed for mild constipation.    [name] levocetirizine (XYZAL) 5 MG tablet Take 1 tablet (5 mg total) by mouth every evening. (Patient taking differently: Take 5 mg by mouth daily as needed. ) 30 tablet 11  . levonorgestrel (MIRENA) 20 MCG/24HR IUD 1 each by Intrauterine route once.    . Melatonin 3 MG TABS Take by mouth.    . Prenatal Vit-Fe Fumarate-FA (PRENATAL MULTIVITAMIN) TABS tablet Take 1 tablet by mouth daily.     [name]  sulfamethoxazole-trimethoprim (BACTRIM DS,SEPTRA DS) 800-160 MG tablet Take 1 tablet by mouth 2 (two) times daily. 14 tablet 0   No current facility-administered medications on file prior to visit.     PAST MEDICAL HISTORY: Past Medical History:  Diagnosis Date  . Abnormal uterine bleeding (AUB)   . Anemia   . Anxiety   . Asthma    as child - no inhaler - no problem as adult  . Astigmatism   . Fibroids   . H/O diabetes insipidus    s/p craniotomy  . Headache(784.0)    hx - none since surgery  . Hx of blood clots 2016   P. E.  p craniotomy. on Xarelto until 2017.  . Joint pain    ankles and hips  . PCOS (polycystic ovarian syndrome)   . Pneumonia    Hx 3-4 yrs ago   . Pre-diabetes   . Seasonal allergies   . Vision abnormalities   . Vitamin D deficiency   . Wears glasses     PAST SURGICAL HISTORY: Past Surgical History:  Procedure Laterality Date  . CRANIOTOMY N/A 10/31/2014   Procedure: CRANIOTOMY FOR RESECTION OF TUMOR;  Surgeon: Jovita Gamma, MD;  Location: [facility];  Service: Neurosurgery;  Laterality: N/A;  Craniotomy for resection of tumor  . DILATATION & CURETTAGE/HYSTEROSCOPY WITH MYOSURE N/A 11/12/2016   Procedure: DILATATION & CURETTAGE/HYSTEROSCOPY  WITH MYOSURE;  Surgeon: Marylynn Pearson, MD;  Location: [facility];  Service: Gynecology;  Laterality: N/A;  . DILATION AND CURETTAGE OF UTERUS     myomectomy  . MYOMECTOMY     vaginal -   . WISDOM TOOTH EXTRACTION  2006    SOCIAL HISTORY: Social History   Tobacco Use  . Smoking status: Never Smoker  . Smokeless tobacco: Never Used  Substance Use Topics  . Alcohol use: Yes    Alcohol/week: 0.0 standard drinks    Comment: occasional wine  . Drug use: No    FAMILY HISTORY: Family History  Problem Relation Age of Onset  . Hypertension Mother   . Diabetes Mother   . Obesity Mother   . Ovarian cancer Unknown   . Breast cancer Unknown   . Fibroids Unknown     ROS: Review of Systems    Constitutional: Positive for malaise/fatigue. Negative for weight loss.  Gastrointestinal: Negative for nausea and vomiting.  Musculoskeletal:       Negative for muscle weakness    PHYSICAL EXAM: Blood pressure 126/73, pulse 84, temperature 98.2 F (36.8 C), temperature source Oral, height 5\' 4"  (1.626 m), weight (!) 336 lb (152.4 kg), SpO2 97 %. Body mass index is 57.67 kg/m. Physical Exam  Constitutional: She is oriented to person, place, and time. She appears well-developed and well-nourished.  Cardiovascular: Normal rate.  Pulmonary/Chest: Effort normal.  Musculoskeletal: Normal range of motion.  Neurological: She is oriented to person, place, and time.  Skin: Skin is warm and dry.  Psychiatric: She has a normal mood and affect. Her behavior is normal.  Vitals reviewed.   RECENT LABS AND TESTS: BMET    Component Value Date/Time   NA 141 11/16/2017 1141   K 4.3 11/16/2017 1141   CL 101 11/16/2017 1141   CO2 25 11/16/2017 1141   GLUCOSE 83 11/16/2017 1141   GLUCOSE 92 11/06/2016 1355   BUN 9 11/16/2017 1141   CREATININE 0.86 11/16/2017 1141   CALCIUM 8.9 11/16/2017 1141   GFRNONAA 86 11/16/2017 1141   GFRAA 99 11/16/2017 1141   Lab Results  Component Value Date   HGBA1C 6.0 (H) 11/16/2017   HGBA1C 5.9 (H) 05/13/2017   HGBA1C 5.5 01/11/2017   HGBA1C 5.6 09/01/2016   HGBA1C 5.8 (H) 05/19/2016   Lab Results  Component Value Date   INSULIN 53.1 (H) 11/16/2017   INSULIN 49.5 (H) 05/13/2017   INSULIN 52.3 (H) 01/11/2017   INSULIN 80.9 (H) 09/01/2016   CBC    Component Value Date/Time   WBC 7.9 05/13/2017 0933   WBC 10.9 (H) 11/06/2016 1355   RBC 4.38 05/13/2017 0933   RBC 3.95 11/06/2016 1355   HGB 11.3 05/13/2017 0933   HCT 36.9 05/13/2017 0933   PLT 516 (H) 01/11/2017 1205   MCV 84 05/13/2017 0933   MCH 25.8 (L) 05/13/2017 0933   MCH 25.8 (L) 11/06/2016 1355   MCHC 30.6 (L) 05/13/2017 0933   MCHC 31.2 11/06/2016 1355   RDW 19.4 (H) 05/13/2017 0933    LYMPHSABS 3.4 (H) 05/13/2017 0933   MONOABS 0.4 11/23/2014 1339   EOSABS 0.4 05/13/2017 0933   BASOSABS 0.0 05/13/2017 0933   Iron/TIBC/Ferritin/ %Sat    Component Value Date/Time   IRON 63 05/19/2016 1105   TIBC 343 05/19/2016 1105   FERRITIN 34 05/19/2016 1105   IRONPCTSAT 18 05/19/2016 1105   Lipid Panel     Component Value Date/Time   CHOL 165 11/16/2017 1141   TRIG  76 11/16/2017 1141   HDL 65 11/16/2017 1141   CHOLHDL 3 09/27/2015 1010   VLDL 21.6 09/27/2015 1010   LDLCALC 85 11/16/2017 1141   Hepatic Function Panel     Component Value Date/Time   PROT 7.0 11/16/2017 1141   ALBUMIN 3.8 11/16/2017 1141   AST 16 11/16/2017 1141   ALT 24 11/16/2017 1141   ALKPHOS 87 11/16/2017 1141   BILITOT 0.5 11/16/2017 1141      Component Value Date/Time   TSH 1.420 05/19/2016 1053   TSH 0.239 (L) 11/26/2014 1325   Results for Kraner, Nakeysha M "Amanda Murphy" (MRN [id number]) as of 01/13/2018 09:45  Ref. Range 11/16/2017 11:41  Vitamin D, 25-Hydroxy Latest Ref Range: 30.0 - 100.0 ng/mL 30.3   ASSESSMENT AND PLAN: Vitamin D deficiency - Plan: Vitamin D, Ergocalciferol, (DRISDOL) 50000 units CAPS capsule  Class 3 severe obesity with serious comorbidity and body mass index (BMI) of 50.0 to 59.9 in adult, unspecified obesity type ([place])  PLAN:  Vitamin D Deficiency Amanda Murphy was informed that low vitamin D levels contributes to fatigue and are associated with obesity, breast, and colon cancer. She agrees to increase prescription Vit D @50 ,000 IU to q3 days #10 with no refills and will follow up for routine testing of vitamin D, at least 2-3 times per year. She was informed of the risk of over-replacement of vitamin D and agrees to not increase her dose unless she discusses this with [place] first. Ciarah agrees to follow up as directed.  We spent > than 50% of the 30 minute visit on the counseling as documented in the note.  Obesity Kenyetta is currently in the action stage of  change. As such, her goal is to continue with weight loss efforts She has agreed to change to the Category 3 plan Cadyn has been instructed to work up to a goal of 150 minutes of combined cardio and strengthening exercise per week for weight loss and overall health benefits. We discussed the following Behavioral Modification Strategies today: increasing lean protein intake, decreasing simple carbohydrates  and work on meal planning and easy cooking plans  Milady has agreed to follow up with our clinic in 2 to 3 weeks. She was informed of the importance of frequent follow up visits to maximize her success with intensive lifestyle modifications for her multiple health conditions.  I, Doreene Nest, am acting as transcriptionist for Dennard Nip, MD  I have reviewed the above documentation for accuracy and completeness, and I agree with the above. -Dennard Nip, MD

## 2018-02-01 ENCOUNTER — Ambulatory Visit (INDEPENDENT_AMBULATORY_CARE_PROVIDER_SITE_OTHER): Payer: 59 | Admitting: Family Medicine

## 2018-02-01 VITALS — BP 118/82 | HR 83 | Temp 98.2°F | Ht 64.0 in | Wt 337.0 lb

## 2018-02-01 DIAGNOSIS — R7303 Prediabetes: Secondary | ICD-10-CM | POA: Diagnosis not present

## 2018-02-01 DIAGNOSIS — Z6841 Body Mass Index (BMI) 40.0 and over, adult: Secondary | ICD-10-CM

## 2018-02-01 DIAGNOSIS — F3289 Other specified depressive episodes: Secondary | ICD-10-CM | POA: Diagnosis not present

## 2018-02-01 DIAGNOSIS — Z9189 Other specified personal risk factors, not elsewhere classified: Secondary | ICD-10-CM | POA: Diagnosis not present

## 2018-02-01 MED ORDER — NALTREXONE-BUPROPION HCL ER 8-90 MG PO TB12: 2.0000 | ORAL_TABLET | Freq: Two times a day (BID) | ORAL | 0 refills | Status: DC

## 2018-02-02 NOTE — Progress Notes (Signed)
Office: [phone]  /  Fax: [phone]   HPI:   Chief Complaint: OBESITY Amanda Murphy is here to discuss her progress with her obesity treatment plan. She is on the Category 3 plan and is following her eating plan approximately 50 % of the time. She states she is exercising 0 minutes 0 times per week. Amanda Murphy was prescribed Belviq at her last visit, but insurance didn't cover Belviq and it requires a stop edit with Contrave first. Her weight is (!) 337 lb (152.9 kg) today and has not lost weight since her last visit. She has lost 0 lbs since starting treatment with [place].  Pre-Diabetes Amanda Murphy has a diagnosis of prediabetes based on her elevated Hgb A1c and was informed this puts her at greater risk of developing diabetes. Amanda Murphy did not tolerate metformin well. Amanda Murphy is attempting to control her prediabetes with diet, but her A1c continues to creep up and her last A1c lab result was at 6.0. She continues to work on diet and exercise to decrease risk of diabetes. She admits polyphagia and denies nausea or hypoglycemia.  At risk for diabetes Amanda Murphy is at higher than average risk for developing diabetes due to her obesity and prediabetes. She currently denies polyuria or polydipsia.  Depression with emotional eating behaviors Amanda Murphy is on Wellbutrin, but she is still struggling with emotional eating and using food for comfort to the extent that it is negatively impacting her health. She often snacks when she is not hungry. Amanda Murphy sometimes feels she is out of control and then feels guilty that she made poor food choices. She has been working on behavior modification techniques to help reduce her emotional eating and has been somewhat successful. Amanda Murphy will be starting on Contrave soon, so she will need to discontinue Wellbutrin. She shows no sign of suicidal or homicidal ideations.  Depression screen Amanda Murphy  Decreased Interest 0 0 2  Down,  Depressed, Hopeless 0 0 1  PHQ - 2 Score 0 0 3  Altered sleeping 1 2 1   Tired, decreased energy 1 1 3   Change in appetite 0 0 3  Feeling bad or failure about yourself  0 0 2  Trouble concentrating 0 0 1  Moving slowly or fidgety/restless 0 0 2  Suicidal thoughts 0 0 0  PHQ-9 Score 2 3 15      ALLERGIES: No Known Allergies  MEDICATIONS: Current Outpatient Medications on File Prior to Visit  Medication Sig Dispense Refill  . acetaminophen (TYLENOL) 500 MG tablet Take 1,000 mg by mouth every 6 (six) hours as needed for mild pain.    [name] aspirin EC 325 MG tablet Take 325 mg by mouth daily.    . calcium carbonate (TUMS - DOSED IN MG ELEMENTAL CALCIUM) 500 MG chewable tablet Chew 1 tablet by mouth as needed. Reported on 07/31/2015    . docusate sodium (COLACE) 100 MG capsule Take 100 mg by mouth daily as needed for mild constipation.    [name] levocetirizine (XYZAL) 5 MG tablet Take 1 tablet (5 mg total) by mouth every evening. (Patient taking differently: Take 5 mg by mouth daily as needed. ) 30 tablet 11  . levonorgestrel (MIRENA) 20 MCG/24HR IUD 1 each by Intrauterine route once.    . Melatonin 3 MG TABS Take by mouth.    . Prenatal Vit-Fe Fumarate-FA (PRENATAL MULTIVITAMIN) TABS tablet Take 1 tablet by mouth daily.     . Vitamin D, Ergocalciferol, (DRISDOL) 50000 units CAPS capsule Take 1 capsule (50,000  Units total) by mouth every 3 (three) days. 10 capsule 0   No current facility-administered medications on file prior to visit.     PAST MEDICAL HISTORY: Past Medical History:  Diagnosis Date  . Abnormal uterine bleeding (AUB)   . Anemia   . Anxiety   . Asthma    as child - no inhaler - no problem as adult  . Astigmatism   . Fibroids   . H/O diabetes insipidus    s/p craniotomy  . Headache(784.0)    hx - none since surgery  . Hx of blood clots 2016   P. E.  p craniotomy. on Xarelto until 2017.  . Joint pain    ankles and hips  . PCOS (polycystic ovarian syndrome)   . Pneumonia     Hx 3-4 yrs ago   . Pre-diabetes   . Seasonal allergies   . Vision abnormalities   . Vitamin D deficiency   . Wears glasses     PAST SURGICAL HISTORY: Past Surgical History:  Procedure Laterality Date  . CRANIOTOMY N/A 10/31/2014   Procedure: CRANIOTOMY FOR RESECTION OF TUMOR;  Surgeon: Jovita Gamma, MD;  Location: [facility];  Service: Neurosurgery;  Laterality: N/A;  Craniotomy for resection of tumor  . DILATATION & CURETTAGE/HYSTEROSCOPY WITH MYOSURE N/A 11/12/2016   Procedure: DILATATION & CURETTAGE/HYSTEROSCOPY WITH MYOSURE;  Surgeon: Marylynn Pearson, MD;  Location: [facility];  Service: Gynecology;  Laterality: N/A;  . DILATION AND CURETTAGE OF UTERUS     myomectomy  . MYOMECTOMY     vaginal -   . WISDOM TOOTH EXTRACTION  2006    SOCIAL HISTORY: Social History   Tobacco Use  . Smoking status: Never Smoker  . Smokeless tobacco: Never Used  Substance Use Topics  . Alcohol use: Yes    Alcohol/week: 0.0 standard drinks    Comment: occasional wine  . Drug use: No    FAMILY HISTORY: Family History  Problem Relation Age of Onset  . Hypertension Mother   . Diabetes Mother   . Obesity Mother   . Ovarian cancer Unknown   . Breast cancer Unknown   . Fibroids Unknown     ROS: Review of Systems  Constitutional: Negative for weight loss.  Gastrointestinal: Negative for nausea.  Genitourinary: Negative for frequency.  Endo/Heme/Allergies: Negative for polydipsia.       Positive for polyphagia Negative for hypoglycemia  Psychiatric/Behavioral: Positive for depression. Negative for suicidal ideas.    PHYSICAL EXAM: Blood pressure 118/82, pulse 83, temperature 98.2 F (36.8 C), temperature source Oral, height 5\' 4"  (1.626 m), weight (!) 337 lb (152.9 kg), SpO2 98 %. Body mass index is 57.85 kg/m. Physical Exam  Constitutional: She is oriented to person, place, and time. She appears well-developed and well-nourished.  Cardiovascular: Normal rate.    Pulmonary/Chest: Effort normal.  Musculoskeletal: Normal range of motion.  Neurological: She is oriented to person, place, and time.  Skin: Skin is warm and dry.  Psychiatric: She has a normal mood and affect. Her behavior is normal.  Vitals reviewed.   RECENT LABS AND TESTS: BMET    Component Value Date/Time   NA 141 11/16/2017 1141   K 4.3 11/16/2017 1141   CL 101 11/16/2017 1141   CO2 25 11/16/2017 1141   GLUCOSE 83 11/16/2017 1141   GLUCOSE 92 11/06/2016 1355   BUN 9 11/16/2017 1141   CREATININE 0.86 11/16/2017 1141   CALCIUM 8.9 11/16/2017 1141   GFRNONAA 86 11/16/2017 1141   GFRAA  99 11/16/2017 1141   Lab Results  Component Value Date   HGBA1C 6.0 (H) 11/16/2017   HGBA1C 5.9 (H) 05/13/2017   HGBA1C 5.5 01/11/2017   HGBA1C 5.6 09/01/2016   HGBA1C 5.8 (H) Murphy   Lab Results  Component Value Date   INSULIN 53.1 (H) 11/16/2017   INSULIN 49.5 (H) 05/13/2017   INSULIN 52.3 (H) 01/11/2017   INSULIN 80.9 (H) 09/01/2016   CBC    Component Value Date/Time   WBC 7.9 05/13/2017 0933   WBC 10.9 (H) 11/06/2016 1355   RBC 4.38 05/13/2017 0933   RBC 3.95 11/06/2016 1355   HGB 11.3 05/13/2017 0933   HCT 36.9 05/13/2017 0933   PLT 516 (H) 01/11/2017 1205   MCV 84 05/13/2017 0933   MCH 25.8 (L) 05/13/2017 0933   MCH 25.8 (L) 11/06/2016 1355   MCHC 30.6 (L) 05/13/2017 0933   MCHC 31.2 11/06/2016 1355   RDW 19.4 (H) 05/13/2017 0933   LYMPHSABS 3.4 (H) 05/13/2017 0933   MONOABS 0.4 11/23/2014 1339   EOSABS 0.4 05/13/2017 0933   BASOSABS 0.0 05/13/2017 0933   Iron/TIBC/Ferritin/ %Sat    Component Value Date/Time   IRON 63 Murphy 1105   TIBC 343 Murphy 1105   FERRITIN 34 Murphy 1105   IRONPCTSAT 18 Murphy 1105   Lipid Panel     Component Value Date/Time   CHOL 165 11/16/2017 1141   TRIG 76 11/16/2017 1141   HDL 65 11/16/2017 1141   CHOLHDL 3 09/27/2015 1010   VLDL 21.6 09/27/2015 1010   LDLCALC 85 11/16/2017 1141   Hepatic Function  Panel     Component Value Date/Time   PROT 7.0 11/16/2017 1141   ALBUMIN 3.8 11/16/2017 1141   AST 16 11/16/2017 1141   ALT 24 11/16/2017 1141   ALKPHOS 87 11/16/2017 1141   BILITOT 0.5 11/16/2017 1141      Component Value Date/Time   TSH 1.420 Murphy 1053   TSH 0.239 (L) 11/26/2014 1325   Results for Ribble, Kimra M "KISHIA" (MRN [id number]) as of 02/02/2018 10:26  Ref. Range 11/16/2017 11:41  Vitamin D, 25-Hydroxy Latest Ref Range: 30.0 - 100.0 ng/mL 30.3   ASSESSMENT AND PLAN: Prediabetes  Other depression - with emotional eating  At risk for diabetes mellitus  Class 3 severe obesity with serious comorbidity and body mass index (BMI) of 50.0 to 59.9 in adult, unspecified obesity type ([place]) - Plan: Naltrexone-buPROPion HCl ER 8-90 MG TB12  PLAN:  Pre-Diabetes Braxton will continue to work on weight loss, exercise, and decreasing simple carbohydrates in her diet to help decrease the risk of diabetes. She was informed that eating too many simple carbohydrates or too many calories at one sitting increases the likelihood of GI side effects. We will recheck labs in 1 month and Evalene agreed to follow up with [place] as directed to monitor her progress.  Diabetes risk counseling Vannie was given extended (15 minutes) diabetes prevention counseling today. She is 37 y.o. female and has risk factors for diabetes including obesity and prediabetes. We discussed intensive lifestyle modifications today with an emphasis on weight loss as well as increasing exercise and decreasing simple carbohydrates in her diet.  Depression with Emotional Eating Behaviors We discussed behavior modification techniques today to help Araya deal with her emotional eating and depression. She has agreed to discontinue Wellbutrin SR 200 mg qd and start Contrave and we will follow her closely. Maylen  agreed to follow up as directed.  Obesity Bertrice is currently in  the action stage of change. As  such, her goal is to continue with weight loss efforts She has agreed to follow the Category 3 plan Avree has been instructed to work up to a goal of 150 minutes of combined cardio and strengthening exercise per week for weight loss and overall health benefits. We discussed the following Behavioral Modification Strategies today: increasing lean protein intake and decreasing simple carbohydrates  We discussed various medication options to help Ryker with her weight loss efforts and we both agreed to start Contrave 2 tablets by mouth twice daily #120 with no refills.  Alexismarie has agreed to follow up with our clinic in 3 weeks. She was informed of the importance of frequent follow up visits to maximize her success with intensive lifestyle modifications for her multiple health conditions.   OBESITY BEHAVIORAL INTERVENTION VISIT  Today's visit was # 31  Starting weight: 324 lbs Starting date: 05/19/16 Today's weight : 337 lbs  Today's date: 02/01/2018 Total lbs lost to date: 0   ASK: We discussed the diagnosis of obesity with Roxanne Mins today and Olin Hauser agreed to give [place] permission to discuss obesity behavioral modification therapy today.  ASSESS: Taygan has the diagnosis of obesity and her BMI today is 39.82 Alysen is in the action stage of change   ADVISE: Halla was educated on the multiple health risks of obesity as well as the benefit of weight loss to improve her health. She was advised of the need for long term treatment and the importance of lifestyle modifications to improve her current health and to decrease her risk of future health problems.  AGREE: Multiple dietary modification options and treatment options were discussed and  Icelyn agreed to follow the recommendations documented in the above note.  ARRANGE: Katasha was educated on the importance of frequent visits to treat obesity as outlined per CMS and USPSTF guidelines and agreed to schedule her  next follow up appointment today.  I, Doreene Nest, am acting as transcriptionist for Dennard Nip, MD  I have reviewed the above documentation for accuracy and completeness, and I agree with the above. -Dennard Nip, MD

## 2018-02-03 ENCOUNTER — Encounter (INDEPENDENT_AMBULATORY_CARE_PROVIDER_SITE_OTHER): Payer: Self-pay

## 2018-02-10 MED FILL — CONTRAVE ER 8-90 MG TABLET: 8-90 | 28 days supply | Qty: 70 | Fill #0

## 2018-02-23 ENCOUNTER — Ambulatory Visit (INDEPENDENT_AMBULATORY_CARE_PROVIDER_SITE_OTHER): Payer: 59 | Admitting: Family Medicine

## 2018-02-23 VITALS — BP 122/78 | HR 76 | Temp 98.0°F | Ht 64.0 in | Wt 334.0 lb

## 2018-02-23 DIAGNOSIS — E559 Vitamin D deficiency, unspecified: Secondary | ICD-10-CM | POA: Diagnosis not present

## 2018-02-23 DIAGNOSIS — Z6841 Body Mass Index (BMI) 40.0 and over, adult: Secondary | ICD-10-CM

## 2018-02-23 DIAGNOSIS — Z9189 Other specified personal risk factors, not elsewhere classified: Secondary | ICD-10-CM

## 2018-02-23 MED ORDER — VITAMIN D (ERGOCALCIFEROL) 1.25 MG (50000 UNIT) PO CAPS: 50000.0000 [IU] | ORAL_CAPSULE | ORAL | 0 refills | Status: DC

## 2018-02-24 MED FILL — VIT D2 1.25 MG (50,000 UNIT: 1.25 MG | 30 days supply | Qty: 10 | Fill #0

## 2018-02-24 NOTE — Progress Notes (Signed)
Office: [phone]  /  Fax: [phone]   HPI:   Chief Complaint: OBESITY Amanda Murphy is here to discuss her progress with her obesity treatment plan. She is on the Category 3 plan and is following her eating plan approximately 75 % of the time. She states she is walking for 60 minutes 2-3 times per week. Amanda Murphy has done well with weight loss. She is doing better with being more mindful, increasing lean protein and vegetables. She states her hunger is better controlled on Contrave.  Her weight is (!) 334 lb (151.5 kg) today and has had a weight loss of 3 pounds over a period of 3 weeks since her last visit. She has lost 0 lbs since starting treatment with [place].  Vitamin D Deficiency Amanda Murphy has a diagnosis of vitamin D deficiency. She has increased prescription Vit D to 2 times per week. She denies nausea, vomiting or muscle weakness.  At risk for osteopenia and osteoporosis Amanda Murphy is at higher risk of osteopenia and osteoporosis due to vitamin D deficiency.   ALLERGIES: No Known Allergies  MEDICATIONS: Current Outpatient Medications on File Prior to Visit  Medication Sig Dispense Refill  . acetaminophen (TYLENOL) 500 MG tablet Take 1,000 mg by mouth every 6 (six) hours as needed for mild pain.    [name] aspirin EC 325 MG tablet Take 325 mg by mouth daily.    . calcium carbonate (TUMS - DOSED IN MG ELEMENTAL CALCIUM) 500 MG chewable tablet Chew 1 tablet by mouth as needed. Reported on 07/31/2015    . docusate sodium (COLACE) 100 MG capsule Take 100 mg by mouth daily as needed for mild constipation.    [name] levocetirizine (XYZAL) 5 MG tablet Take 1 tablet (5 mg total) by mouth every evening. (Patient taking differently: Take 5 mg by mouth daily as needed. ) 30 tablet 11  . levonorgestrel (MIRENA) 20 MCG/24HR IUD 1 each by Intrauterine route once.    . Melatonin 3 MG TABS Take by mouth.    . Naltrexone-buPROPion HCl ER 8-90 MG TB12 Take 2 tablets by mouth 2 (two) times daily. 120 tablet 0    . Prenatal Vit-Fe Fumarate-FA (PRENATAL MULTIVITAMIN) TABS tablet Take 1 tablet by mouth daily.      No current facility-administered medications on file prior to visit.     PAST MEDICAL HISTORY: Past Medical History:  Diagnosis Date  . Abnormal uterine bleeding (AUB)   . Anemia   . Anxiety   . Asthma    as child - no inhaler - no problem as adult  . Astigmatism   . Fibroids   . H/O diabetes insipidus    s/p craniotomy  . Headache(784.0)    hx - none since surgery  . Hx of blood clots 2016   P. E.  p craniotomy. on Xarelto until 2017.  . Joint pain    ankles and hips  . PCOS (polycystic ovarian syndrome)   . Pneumonia    Hx 3-4 yrs ago   . Pre-diabetes   . Seasonal allergies   . Vision abnormalities   . Vitamin D deficiency   . Wears glasses     PAST SURGICAL HISTORY: Past Surgical History:  Procedure Laterality Date  . CRANIOTOMY N/A 10/31/2014   Procedure: CRANIOTOMY FOR RESECTION OF TUMOR;  Surgeon: Jovita Gamma, MD;  Location: [facility];  Service: Neurosurgery;  Laterality: N/A;  Craniotomy for resection of tumor  . DILATATION & CURETTAGE/HYSTEROSCOPY WITH MYOSURE N/A 11/12/2016   Procedure: DILATATION &  CURETTAGE/HYSTEROSCOPY WITH MYOSURE;  Surgeon: Marylynn Pearson, MD;  Location: [facility];  Service: Gynecology;  Laterality: N/A;  . DILATION AND CURETTAGE OF UTERUS     myomectomy  . MYOMECTOMY     vaginal -   . WISDOM TOOTH EXTRACTION  2006    SOCIAL HISTORY: Social History   Tobacco Use  . Smoking status: Never Smoker  . Smokeless tobacco: Never Used  Substance Use Topics  . Alcohol use: Yes    Alcohol/week: 0.0 standard drinks    Comment: occasional wine  . Drug use: No    FAMILY HISTORY: Family History  Problem Relation Age of Onset  . Hypertension Mother   . Diabetes Mother   . Obesity Mother   . Ovarian cancer Unknown   . Breast cancer Unknown   . Fibroids Unknown     ROS: Review of Systems  Constitutional: Positive for weight  loss.  Gastrointestinal: Negative for nausea and vomiting.  Musculoskeletal:       Negative muscle weakness    PHYSICAL EXAM: Blood pressure 122/78, pulse 76, temperature 98 F (36.7 C), temperature source Oral, height 5\' 4"  (1.626 m), weight (!) 334 lb (151.5 kg), SpO2 97 %. Body mass index is 57.33 kg/m. Physical Exam  Constitutional: She is oriented to person, place, and time. She appears well-developed and well-nourished.  Cardiovascular: Normal rate.  Pulmonary/Chest: Effort normal.  Musculoskeletal: Normal range of motion.  Neurological: She is oriented to person, place, and time.  Skin: Skin is warm and dry.  Psychiatric: She has a normal mood and affect. Her behavior is normal.  Vitals reviewed.   RECENT LABS AND TESTS: BMET    Component Value Date/Time   NA 141 11/16/2017 1141   K 4.3 11/16/2017 1141   CL 101 11/16/2017 1141   CO2 25 11/16/2017 1141   GLUCOSE 83 11/16/2017 1141   GLUCOSE 92 11/06/2016 1355   BUN 9 11/16/2017 1141   CREATININE 0.86 11/16/2017 1141   CALCIUM 8.9 11/16/2017 1141   GFRNONAA 86 11/16/2017 1141   GFRAA 99 11/16/2017 1141   Lab Results  Component Value Date   HGBA1C 6.0 (H) 11/16/2017   HGBA1C 5.9 (H) 05/13/2017   HGBA1C 5.5 01/11/2017   HGBA1C 5.6 09/01/2016   HGBA1C 5.8 (H) 05/19/2016   Lab Results  Component Value Date   INSULIN 53.1 (H) 11/16/2017   INSULIN 49.5 (H) 05/13/2017   INSULIN 52.3 (H) 01/11/2017   INSULIN 80.9 (H) 09/01/2016   CBC    Component Value Date/Time   WBC 7.9 05/13/2017 0933   WBC 10.9 (H) 11/06/2016 1355   RBC 4.38 05/13/2017 0933   RBC 3.95 11/06/2016 1355   HGB 11.3 05/13/2017 0933   HCT 36.9 05/13/2017 0933   PLT 516 (H) 01/11/2017 1205   MCV 84 05/13/2017 0933   MCH 25.8 (L) 05/13/2017 0933   MCH 25.8 (L) 11/06/2016 1355   MCHC 30.6 (L) 05/13/2017 0933   MCHC 31.2 11/06/2016 1355   RDW 19.4 (H) 05/13/2017 0933   LYMPHSABS 3.4 (H) 05/13/2017 0933   MONOABS 0.4 11/23/2014 1339    EOSABS 0.4 05/13/2017 0933   BASOSABS 0.0 05/13/2017 0933   Iron/TIBC/Ferritin/ %Sat    Component Value Date/Time   IRON 63 05/19/2016 1105   TIBC 343 05/19/2016 1105   FERRITIN 34 05/19/2016 1105   IRONPCTSAT 18 05/19/2016 1105   Lipid Panel     Component Value Date/Time   CHOL 165 11/16/2017 1141   TRIG 76 11/16/2017 1141  HDL 65 11/16/2017 1141   CHOLHDL 3 09/27/2015 1010   VLDL 21.6 09/27/2015 1010   LDLCALC 85 11/16/2017 1141   Hepatic Function Panel     Component Value Date/Time   PROT 7.0 11/16/2017 1141   ALBUMIN 3.8 11/16/2017 1141   AST 16 11/16/2017 1141   ALT 24 11/16/2017 1141   ALKPHOS 87 11/16/2017 1141   BILITOT 0.5 11/16/2017 1141      Component Value Date/Time   TSH 1.420 05/19/2016 1053   TSH 0.239 (L) 11/26/2014 1325  Results for Huberty, Chanin M "KISHIA" (MRN [id number]) as of 02/24/2018 11:00  Ref. Range 11/16/2017 11:41  Vitamin D, 25-Hydroxy Latest Ref Range: 30.0 - 100.0 ng/mL 30.3    ASSESSMENT AND PLAN: Vitamin D deficiency - Plan: Vitamin D, Ergocalciferol, (DRISDOL) 50000 units CAPS capsule  At risk for osteoporosis  Class 3 severe obesity with serious comorbidity and body mass index (BMI) of 50.0 to 59.9 in adult, unspecified obesity type ([place])  PLAN:  Vitamin D Deficiency Edona was informed that low vitamin D levels contributes to fatigue and are associated with obesity, breast, and colon cancer. Kataryna agrees to continue taking prescription Vit D @50 ,000 IU every 3 days #10 and we will refill for 1 month. She will follow up for routine testing of vitamin D, at least 2-3 times per year. She was informed of the risk of over-replacement of vitamin D and agrees to not increase her dose unless she discusses this with [place] first. Inell agrees to follow up with our clinic in 3 weeks and we will recheck labs at that time.  At risk for osteopenia and osteoporosis Catilyn was given extended  (15 minutes) osteoporosis prevention  counseling today. Paullette is at risk for osteopenia and osteoporsis due to her vitamin D deficiency. She was encouraged to take her vitamin D and follow her higher calcium diet and increase strengthening exercise to help strengthen her bones and decrease her risk of osteopenia and osteoporosis.  Obesity Paxton is currently in the action stage of change. As such, her goal is to continue with weight loss efforts She has agreed to follow the Category 3 plan Holle has been instructed to work up to a goal of 150 minutes of combined cardio and strengthening exercise per week for weight loss and overall health benefits. We discussed the following Behavioral Modification Strategies today: increasing lean protein intake, decreasing simple carbohydrates  and work on meal planning and easy cooking plans We discussed various medication options to help [place] with her weight loss efforts and we both agreed [place] will continue Contrave BID and diet.   Xochilth has agreed to follow up with our clinic in 3 weeks. She was informed of the importance of frequent follow up visits to maximize her success with intensive lifestyle modifications for her multiple health conditions.   OBESITY BEHAVIORAL INTERVENTION VISIT  Today's visit was # 33   Starting weight: 324 lbs Starting date: 05/19/16 Today's weight : 334 lbs  Today's date: 02/23/2018 Total lbs lost to date: 0    ASK: We discussed the diagnosis of obesity with Roxanne Mins today and Olin Hauser agreed to give [place] permission to discuss obesity behavioral modification therapy today.  ASSESS: Kriste has the diagnosis of obesity and her BMI today is 65.3 Alisabeth is in the action stage of change   ADVISE: Marianny was educated on the multiple health risks of obesity as well as the benefit of weight loss to improve her health. She was advised  of the need for long term treatment and the importance of lifestyle modifications to improve her  current health and to decrease her risk of future health problems.  AGREE: Multiple dietary modification options and treatment options were discussed and  Shalin agreed to follow the recommendations documented in the above note.  ARRANGE: Kiaria was educated on the importance of frequent visits to treat obesity as outlined per CMS and USPSTF guidelines and agreed to schedule her next follow up appointment today.  I, Trixie Dredge, am acting as transcriptionist for Dennard Nip, MD  I have reviewed the above documentation for accuracy and completeness, and I agree with the above. -Dennard Nip, MD

## 2018-03-17 ENCOUNTER — Ambulatory Visit (INDEPENDENT_AMBULATORY_CARE_PROVIDER_SITE_OTHER): Payer: 59 | Admitting: Family Medicine

## 2018-03-17 VITALS — BP 126/78 | HR 89 | Temp 98.6°F | Ht 64.0 in | Wt 333.0 lb

## 2018-03-17 DIAGNOSIS — Z6841 Body Mass Index (BMI) 40.0 and over, adult: Secondary | ICD-10-CM

## 2018-03-17 DIAGNOSIS — K5909 Other constipation: Secondary | ICD-10-CM

## 2018-03-21 NOTE — Progress Notes (Signed)
Office: [phone]  /  Fax: [phone]   HPI:   Chief Complaint: OBESITY Amanda Murphy is here to discuss her progress with her obesity treatment plan. She is on the Category 3 plan and is following her eating plan approximately 90 % of the time. She states she is doing water aerobics for 50-53 minutes 3 times per week. Amanda Murphy is doing better with weight loss on Contrave. She is working on Research scientist (life sciences) and is considering having the gastric sleeve.  Her weight is (!) 333 lb (151 kg) today and has had a weight loss of 1 pounds over a period of 3 weeks since her last visit. She has lost 0 lbs since starting treatment with [place].  Constipation Amanda Murphy notes constipation, she is on colace as needed and feels it is beneficial. She has decrease in constipation when she increases vegetables in her diet. She states BM are less frequent and are not hard and painful. She denies hematochezia or melena. She admits to drinking less H20 recently.  ALLERGIES: No Known Allergies  MEDICATIONS: Current Outpatient Medications on File Prior to Visit  Medication Sig Dispense Refill  . acetaminophen (TYLENOL) 500 MG tablet Take 1,000 mg by mouth every 6 (six) hours as needed for mild pain.    [name] aspirin EC 325 MG tablet Take 325 mg by mouth daily.    . calcium carbonate (TUMS - DOSED IN MG ELEMENTAL CALCIUM) 500 MG chewable tablet Chew 1 tablet by mouth as needed. Reported on 07/31/2015    . docusate sodium (COLACE) 100 MG capsule Take 100 mg by mouth daily as needed for mild constipation.    [name] levocetirizine (XYZAL) 5 MG tablet Take 1 tablet (5 mg total) by mouth every evening. (Patient taking differently: Take 5 mg by mouth daily as needed. ) 30 tablet 11  . levonorgestrel (MIRENA) 20 MCG/24HR IUD 1 each by Intrauterine route once.    . Melatonin 3 MG TABS Take by mouth.    . Naltrexone-buPROPion HCl ER 8-90 MG TB12 Take 2 tablets by mouth 2 (two) times daily. 120 tablet 0  .  Prenatal Vit-Fe Fumarate-FA (PRENATAL MULTIVITAMIN) TABS tablet Take 1 tablet by mouth daily.     . Vitamin D, Ergocalciferol, (DRISDOL) 50000 units CAPS capsule Take 1 capsule (50,000 Units total) by mouth every 3 (three) days. 10 capsule 0   No current facility-administered medications on file prior to visit.     PAST MEDICAL HISTORY: Past Medical History:  Diagnosis Date  . Abnormal uterine bleeding (AUB)   . Anemia   . Anxiety   . Asthma    as child - no inhaler - no problem as adult  . Astigmatism   . Fibroids   . H/O diabetes insipidus    s/p craniotomy  . Headache(784.0)    hx - none since surgery  . Hx of blood clots 2016   P. E.  p craniotomy. on Xarelto until 2017.  . Joint pain    ankles and hips  . PCOS (polycystic ovarian syndrome)   . Pneumonia    Hx 3-4 yrs ago   . Pre-diabetes   . Seasonal allergies   . Vision abnormalities   . Vitamin D deficiency   . Wears glasses     PAST SURGICAL HISTORY: Past Surgical History:  Procedure Laterality Date  . CRANIOTOMY N/A 10/31/2014   Procedure: CRANIOTOMY FOR RESECTION OF TUMOR;  Surgeon: Jovita Gamma, MD;  Location: [place] [facility];  Service: Neurosurgery;  Laterality: N/A;  Craniotomy for resection of tumor  . DILATATION & CURETTAGE/HYSTEROSCOPY WITH MYOSURE N/A 11/12/2016   Procedure: DILATATION & CURETTAGE/HYSTEROSCOPY WITH MYOSURE;  Surgeon: Marylynn Pearson, MD;  Location: [facility];  Service: Gynecology;  Laterality: N/A;  . DILATION AND CURETTAGE OF UTERUS     myomectomy  . MYOMECTOMY     vaginal -   . WISDOM TOOTH EXTRACTION  2006    SOCIAL HISTORY: Social History   Tobacco Use  . Smoking status: Never Smoker  . Smokeless tobacco: Never Used  Substance Use Topics  . Alcohol use: Yes    Alcohol/week: 0.0 standard drinks    Comment: occasional wine  . Drug use: No    FAMILY HISTORY: Family History  Problem Relation Age of Onset  . Hypertension Mother   . Diabetes Mother   . Obesity Mother   .  Ovarian cancer Unknown   . Breast cancer Unknown   . Fibroids Unknown     ROS: Review of Systems  Constitutional: Positive for weight loss.  Gastrointestinal: Positive for constipation. Negative for melena.       Negative hematochezia    PHYSICAL EXAM: Blood pressure 126/78, pulse 89, temperature 98.6 F (37 C), temperature source Oral, height 5\' 4"  (1.626 m), weight (!) 333 lb (151 kg), SpO2 96 %. Body mass index is 57.16 kg/m. Physical Exam  Constitutional: She is oriented to person, place, and time. She appears well-developed and well-nourished.  Cardiovascular: Normal rate.  Pulmonary/Chest: Effort normal.  Musculoskeletal: Normal range of motion.  Neurological: She is oriented to person, place, and time.  Skin: Skin is warm and dry.  Psychiatric: She has a normal mood and affect. Her behavior is normal.  Vitals reviewed.   RECENT LABS AND TESTS: BMET    Component Value Date/Time   NA 141 11/16/2017 1141   K 4.3 11/16/2017 1141   CL 101 11/16/2017 1141   CO2 25 11/16/2017 1141   GLUCOSE 83 11/16/2017 1141   GLUCOSE 92 11/06/2016 1355   BUN 9 11/16/2017 1141   CREATININE 0.86 11/16/2017 1141   CALCIUM 8.9 11/16/2017 1141   GFRNONAA 86 11/16/2017 1141   GFRAA 99 11/16/2017 1141   Lab Results  Component Value Date   HGBA1C 6.0 (H) 11/16/2017   HGBA1C 5.9 (H) 05/13/2017   HGBA1C 5.5 01/11/2017   HGBA1C 5.6 09/01/2016   HGBA1C 5.8 (H) 05/19/2016   Lab Results  Component Value Date   INSULIN 53.1 (H) 11/16/2017   INSULIN 49.5 (H) 05/13/2017   INSULIN 52.3 (H) 01/11/2017   INSULIN 80.9 (H) 09/01/2016   CBC    Component Value Date/Time   WBC 7.9 05/13/2017 0933   WBC 10.9 (H) 11/06/2016 1355   RBC 4.38 05/13/2017 0933   RBC 3.95 11/06/2016 1355   HGB 11.3 05/13/2017 0933   HCT 36.9 05/13/2017 0933   PLT 516 (H) 01/11/2017 1205   MCV 84 05/13/2017 0933   MCH 25.8 (L) 05/13/2017 0933   MCH 25.8 (L) 11/06/2016 1355   MCHC 30.6 (L) 05/13/2017 0933    MCHC 31.2 11/06/2016 1355   RDW 19.4 (H) 05/13/2017 0933   LYMPHSABS 3.4 (H) 05/13/2017 0933   MONOABS 0.4 11/23/2014 1339   EOSABS 0.4 05/13/2017 0933   BASOSABS 0.0 05/13/2017 0933   Iron/TIBC/Ferritin/ %Sat    Component Value Date/Time   IRON 63 05/19/2016 1105   TIBC 343 05/19/2016 1105   FERRITIN 34 05/19/2016 1105   IRONPCTSAT 18 05/19/2016 1105   Lipid Panel  Component Value Date/Time   CHOL 165 11/16/2017 1141   TRIG 76 11/16/2017 1141   HDL 65 11/16/2017 1141   CHOLHDL 3 09/27/2015 1010   VLDL 21.6 09/27/2015 1010   LDLCALC 85 11/16/2017 1141   Hepatic Function Panel     Component Value Date/Time   PROT 7.0 11/16/2017 1141   ALBUMIN 3.8 11/16/2017 1141   AST 16 11/16/2017 1141   ALT 24 11/16/2017 1141   ALKPHOS 87 11/16/2017 1141   BILITOT 0.5 11/16/2017 1141      Component Value Date/Time   TSH 1.420 05/19/2016 1053   TSH 0.239 (L) 11/26/2014 1325    ASSESSMENT AND PLAN: Other constipation  Class 3 severe obesity with serious comorbidity and body mass index (BMI) of 50.0 to 59.9 in adult, unspecified obesity type ([place])  PLAN:  Constipation Vaishnavi was informed decrease bowel movement frequency is normal while losing weight, but stools should not be hard or painful. She will continue diet and exercise, and was advised to increase her H20 intake and work on increasing her fiber intake. High fiber foods were discussed today. Kennis agrees to follow up with our clinic in 3 weeks.  I spent > than 50% of the 15 minute visit on counseling as documented in the note.  Obesity Amanda Murphy is currently in the action stage of change. As such, her goal is to continue with weight loss efforts She has agreed to follow the Category 3 plan Amanda Murphy has been instructed to work up to a goal of 150 minutes of combined cardio and strengthening exercise per week for weight loss and overall health benefits. We discussed the following Behavioral Modification Strategies  today: increasing lean protein intake, decreasing simple carbohydrates, increasing fiber rich foods, work on meal planning and easy cooking plans, and increase H20 intake We discussed various medication options to help Amanda Murphy with her weight loss efforts and we both agreed to continue Contrave and work on Psychiatric nurse eating.  Amanda Murphy has agreed to follow up with our clinic in 3 weeks. She was informed of the importance of frequent follow up visits to maximize her success with intensive lifestyle modifications for her multiple health conditions.   OBESITY BEHAVIORAL INTERVENTION VISIT  Today's visit was # 34   Starting weight: 324 lbs Starting date: 05/19/16 Today's weight : 333 lbs  Today's date: 03/17/2018 Total lbs lost to date: 0    ASK: We discussed the diagnosis of obesity with Amanda Murphy today and Amanda Murphy agreed to give [place] permission to discuss obesity behavioral modification therapy today.  ASSESS: Amanda Murphy has the diagnosis of obesity and her BMI today is 57.13 Amanda Murphy is in the action stage of change   ADVISE: Mazzie was educated on the multiple health risks of obesity as well as the benefit of weight loss to improve her health. She was advised of the need for long term treatment and the importance of lifestyle modifications to improve her current health and to decrease her risk of future health problems.  AGREE: Multiple dietary modification options and treatment options were discussed and  Amanda Murphy agreed to follow the recommendations documented in the above note.  ARRANGE: Amanda Murphy was educated on the importance of frequent visits to treat obesity as outlined per CMS and USPSTF guidelines and agreed to schedule her next follow up appointment today.  I, Trixie Dredge, am acting as transcriptionist for Dennard Nip, MD  I have reviewed the above documentation for accuracy and completeness, and I agree with the above. -Caren  Leafy Ro, MD

## 2018-04-04 ENCOUNTER — Telehealth (INDEPENDENT_AMBULATORY_CARE_PROVIDER_SITE_OTHER): Payer: Self-pay | Admitting: Family Medicine

## 2018-04-04 MED FILL — VIT D2 1.25 MG (50,000 UNIT: 1.25 MG | 30 days supply | Qty: 10 | Fill #0

## 2018-04-04 NOTE — Telephone Encounter (Signed)
Patient is requesting a refill of her Contrave.  Pharmacy is [facility] on [facility] [address]. Thank you

## 2018-04-05 ENCOUNTER — Encounter (INDEPENDENT_AMBULATORY_CARE_PROVIDER_SITE_OTHER): Payer: Self-pay

## 2018-04-05 ENCOUNTER — Other Ambulatory Visit (INDEPENDENT_AMBULATORY_CARE_PROVIDER_SITE_OTHER): Payer: Self-pay | Admitting: Family Medicine

## 2018-04-05 DIAGNOSIS — Z6841 Body Mass Index (BMI) 40.0 and over, adult: Principal | ICD-10-CM

## 2018-04-05 NOTE — Telephone Encounter (Signed)
Patient will be seen on 10/30 for the refill. Angy Swearengin, [place]

## 2018-04-05 NOTE — Telephone Encounter (Signed)
Pt needs to send a request through myChart

## 2018-04-06 ENCOUNTER — Encounter (INDEPENDENT_AMBULATORY_CARE_PROVIDER_SITE_OTHER): Payer: Self-pay | Admitting: Physician Assistant

## 2018-04-06 ENCOUNTER — Ambulatory Visit (INDEPENDENT_AMBULATORY_CARE_PROVIDER_SITE_OTHER): Payer: 59 | Admitting: Physician Assistant

## 2018-04-06 VITALS — BP 107/68 | HR 72 | Temp 97.9°F | Ht 64.0 in | Wt 331.0 lb

## 2018-04-06 DIAGNOSIS — E559 Vitamin D deficiency, unspecified: Secondary | ICD-10-CM | POA: Diagnosis not present

## 2018-04-06 DIAGNOSIS — Z9189 Other specified personal risk factors, not elsewhere classified: Secondary | ICD-10-CM | POA: Diagnosis not present

## 2018-04-06 DIAGNOSIS — R7303 Prediabetes: Secondary | ICD-10-CM

## 2018-04-06 DIAGNOSIS — Z6841 Body Mass Index (BMI) 40.0 and over, adult: Secondary | ICD-10-CM

## 2018-04-06 MED ORDER — NALTREXONE-BUPROPION HCL ER 8-90 MG PO TB12: 2.0000 | ORAL_TABLET | Freq: Two times a day (BID) | ORAL | 0 refills | Status: DC

## 2018-04-06 MED FILL — CONTRAVE ER 8-90 MG TABLET: 8-90 | 30 days supply | Qty: 120 | Fill #0

## 2018-04-06 NOTE — Progress Notes (Signed)
Office: [phone]  /  Fax: [phone]   HPI:   Chief Complaint: OBESITY Amanda Murphy is here to discuss her progress with her obesity treatment plan. She is on the  follow the Category 3 plan and is following her eating plan approximately 90 % of the time. She states she is exercising 60 minutes 3 times per week.Ashliegh did well with weight loss. She reports that she struggles with getting in all her protein at dinner. She is attempting to break it up throughout the day. She reports that Contrave helps with her cravings.  Her weight is (!) 331 lb (150.1 kg) today and has had a weight loss of 2 pounds over a period of 3 weeks since her last visit. She has lost 2 lbs since starting treatment with [place].  Vitamin D deficiency Amanda Murphy has a diagnosis of vitamin D deficiency. She is currently taking vit D and denies nausea, vomiting or muscle weakness.  Pre-Diabetes Amanda Murphy has a diagnosis of prediabetes based on her elevated HgA1c and was informed this puts her at greater risk of developing diabetes. She is not taking metformin currently and continues to work on diet and exercise to decrease risk of diabetes. She denies nausea or hypoglycemia.  At risk for osteopenia and osteoporosis Amanda Murphy is at higher risk of osteopenia and osteoporosis due to vitamin D deficiency.   ALLERGIES: No Known Allergies  MEDICATIONS: Current Outpatient Medications on File Prior to Visit  Medication Sig Dispense Refill  . acetaminophen (TYLENOL) 500 MG tablet Take 1,000 mg by mouth every 6 (six) hours as needed for mild pain.    [name] aspirin EC 325 MG tablet Take 325 mg by mouth daily.    . calcium carbonate (TUMS - DOSED IN MG ELEMENTAL CALCIUM) 500 MG chewable tablet Chew 1 tablet by mouth as needed. Reported on 07/31/2015    . docusate sodium (COLACE) 100 MG capsule Take 100 mg by mouth daily as needed for mild constipation.    [name] levocetirizine (XYZAL) 5 MG tablet Take 1 tablet (5 mg total) by mouth every  evening. (Patient taking differently: Take 5 mg by mouth daily as needed. ) 30 tablet 11  . levonorgestrel (MIRENA) 20 MCG/24HR IUD 1 each by Intrauterine route once.    . Melatonin 3 MG TABS Take by mouth.    . Prenatal Vit-Fe Fumarate-FA (PRENATAL MULTIVITAMIN) TABS tablet Take 1 tablet by mouth daily.     . Vitamin D, Ergocalciferol, (DRISDOL) 50000 units CAPS capsule Take 1 capsule (50,000 Units total) by mouth every 3 (three) days. 10 capsule 0   No current facility-administered medications on file prior to visit.     PAST MEDICAL HISTORY: Past Medical History:  Diagnosis Date  . Abnormal uterine bleeding (AUB)   . Anemia   . Anxiety   . Asthma    as child - no inhaler - no problem as adult  . Astigmatism   . Fibroids   . H/O diabetes insipidus    s/p craniotomy  . Headache(784.0)    hx - none since surgery  . Hx of blood clots 2016   P. E.  p craniotomy. on Xarelto until 2017.  . Joint pain    ankles and hips  . PCOS (polycystic ovarian syndrome)   . Pneumonia    Hx 3-4 yrs ago   . Pre-diabetes   . Seasonal allergies   . Vision abnormalities   . Vitamin D deficiency   . Wears glasses     PAST SURGICAL  HISTORY: Past Surgical History:  Procedure Laterality Date  . CRANIOTOMY N/A 10/31/2014   Procedure: CRANIOTOMY FOR RESECTION OF TUMOR;  Surgeon: Jovita Gamma, MD;  Location: [facility];  Service: Neurosurgery;  Laterality: N/A;  Craniotomy for resection of tumor  . DILATATION & CURETTAGE/HYSTEROSCOPY WITH MYOSURE N/A 11/12/2016   Procedure: DILATATION & CURETTAGE/HYSTEROSCOPY WITH MYOSURE;  Surgeon: Marylynn Pearson, MD;  Location: [facility];  Service: Gynecology;  Laterality: N/A;  . DILATION AND CURETTAGE OF UTERUS     myomectomy  . MYOMECTOMY     vaginal -   . WISDOM TOOTH EXTRACTION  2006    SOCIAL HISTORY: Social History   Tobacco Use  . Smoking status: Never Smoker  . Smokeless tobacco: Never Used  Substance Use Topics  . Alcohol use: Yes     Alcohol/week: 0.0 standard drinks    Comment: occasional wine  . Drug use: No    FAMILY HISTORY: Family History  Problem Relation Age of Onset  . Hypertension Mother   . Diabetes Mother   . Obesity Mother   . Ovarian cancer Unknown   . Breast cancer Unknown   . Fibroids Unknown     ROS: Review of Systems  Constitutional: Positive for weight loss.  All other systems reviewed and are negative.   PHYSICAL EXAM: Blood pressure 107/68, pulse 72, temperature 97.9 F (36.6 C), temperature source Oral, height 5\' 4"  (1.626 m), weight (!) 331 lb (150.1 kg), SpO2 99 %. Body mass index is 56.82 kg/m. Physical Exam  Constitutional: She is oriented to person, place, and time. She appears well-developed and well-nourished.  HENT:  Head: Normocephalic and atraumatic.  Eyes: Pupils are equal, round, and reactive to light.  Neck: Normal range of motion.  Pulmonary/Chest: Effort normal.  Neurological: She is alert and oriented to person, place, and time.  Skin: Skin is warm and dry.  Psychiatric: Her behavior is normal.  Vitals reviewed.   RECENT LABS AND TESTS: BMET    Component Value Date/Time   NA 141 11/16/2017 1141   K 4.3 11/16/2017 1141   CL 101 11/16/2017 1141   CO2 25 11/16/2017 1141   GLUCOSE 83 11/16/2017 1141   GLUCOSE 92 11/06/2016 1355   BUN 9 11/16/2017 1141   CREATININE 0.86 11/16/2017 1141   CALCIUM 8.9 11/16/2017 1141   GFRNONAA 86 11/16/2017 1141   GFRAA 99 11/16/2017 1141   Lab Results  Component Value Date   HGBA1C 6.0 (H) 11/16/2017   HGBA1C 5.9 (H) 05/13/2017   HGBA1C 5.5 01/11/2017   HGBA1C 5.6 09/01/2016   HGBA1C 5.8 (H) 05/19/2016   Lab Results  Component Value Date   INSULIN 53.1 (H) 11/16/2017   INSULIN 49.5 (H) 05/13/2017   INSULIN 52.3 (H) 01/11/2017   INSULIN 80.9 (H) 09/01/2016   CBC    Component Value Date/Time   WBC 7.9 05/13/2017 0933   WBC 10.9 (H) 11/06/2016 1355   RBC 4.38 05/13/2017 0933   RBC 3.95 11/06/2016 1355   HGB  11.3 05/13/2017 0933   HCT 36.9 05/13/2017 0933   PLT 516 (H) 01/11/2017 1205   MCV 84 05/13/2017 0933   MCH 25.8 (L) 05/13/2017 0933   MCH 25.8 (L) 11/06/2016 1355   MCHC 30.6 (L) 05/13/2017 0933   MCHC 31.2 11/06/2016 1355   RDW 19.4 (H) 05/13/2017 0933   LYMPHSABS 3.4 (H) 05/13/2017 0933   MONOABS 0.4 11/23/2014 1339   EOSABS 0.4 05/13/2017 0933   BASOSABS 0.0 05/13/2017 0933   Iron/TIBC/Ferritin/ %Sat  Component Value Date/Time   IRON 63 05/19/2016 1105   TIBC 343 05/19/2016 1105   FERRITIN 34 05/19/2016 1105   IRONPCTSAT 18 05/19/2016 1105   Lipid Panel     Component Value Date/Time   CHOL 165 11/16/2017 1141   TRIG 76 11/16/2017 1141   HDL 65 11/16/2017 1141   CHOLHDL 3 09/27/2015 1010   VLDL 21.6 09/27/2015 1010   LDLCALC 85 11/16/2017 1141   Hepatic Function Panel     Component Value Date/Time   PROT 7.0 11/16/2017 1141   ALBUMIN 3.8 11/16/2017 1141   AST 16 11/16/2017 1141   ALT 24 11/16/2017 1141   ALKPHOS 87 11/16/2017 1141   BILITOT 0.5 11/16/2017 1141      Component Value Date/Time   TSH 1.420 05/19/2016 1053   TSH 0.239 (L) 11/26/2014 1325    ASSESSMENT AND PLAN: Vitamin D deficiency  Prediabetes  At risk for osteoporosis  Class 3 severe obesity with serious comorbidity and body mass index (BMI) of 50.0 to 59.9 in adult, unspecified obesity type ([place]) - Plan: Naltrexone-buPROPion HCl ER 8-90 MG TB12  PLAN: Vitamin D Deficiency Amanda Murphy was informed that low vitamin D levels contributes to fatigue and are associated with obesity, breast, and colon cancer. She agrees to continue to take prescription Vit D @50 ,000 IU every week and will follow up for routine testing of vitamin D, at least 2-3 times per year. She was informed of the risk of over-replacement of vitamin D and agrees to not increase her dose unless she discusses this with [place] first. Will check labs at next visit.   Pre-Diabetes Amanda Murphy will continue to work on weight loss,  exercise, and decreasing simple carbohydrates in her diet to help decrease the risk of diabetes. We dicussed metformin including benefits and risks. She was informed that eating too many simple carbohydrates or too many calories at one sitting increases the likelihood of GI side effects. Amanda Murphy declined metformin for now and a prescription was not written today. Lakeya agreed to follow up with [place] as directed to monitor her progress. Will check labs at next visit.   At risk for osteopenia and osteoporosis Amanda Murphy was given extended  (15 minutes) osteoporosis prevention counseling today. Amanda Murphy is at risk for osteopenia and osteoporsis due to her vitamin D deficiency. She was encouraged to take her vitamin D and follow her higher calcium diet and increase strengthening exercise to help strengthen her bones and decrease her risk of osteopenia and osteoporosis.   Obesity Amanda Murphy is currently in the action stage of change. As such, her goal is to continue with weight loss efforts. She has agreed to follow the Category 3 plan daily Amanda Murphy has been instructed to work up to a goal of 150 minutes of combined cardio and strengthening exercise per week for weight loss and overall health benefits. A refill was done today for Contrave with a month supply.  We discussed the following Behavioral Modification Stratagies today: increasing lean protein intake, no skipping meals and keeping healthy foods in the home.    Amanda Murphy has agreed to follow up with our clinic in 3 weeks. She was informed of the importance of frequent follow up visits to maximize her success with intensive lifestyle modifications for her multiple health conditions.   OBESITY BEHAVIORAL INTERVENTION VISIT  Today's visit was # 35  Starting weight: 324 lbs Starting date: 05/19/16 Today's weight : Weight: (!) 331 lb (150.1 kg)  Today's date: 04/06/2018 Total lbs lost to date: 2  ASK: We discussed the diagnosis of obesity  with Amanda Murphy today and Amanda Murphy agreed to give [place] permission to discuss obesity behavioral modification therapy today.  ASSESS: Mansi has the diagnosis of obesity and her BMI today is 56.9. Isebella is in the action stage of change   ADVISE: Amanda Murphy was educated on the multiple health risks of obesity as well as the benefit of weight loss to improve her health. She was advised of the need for long term treatment and the importance of lifestyle modifications to improve her current health and to decrease her risk of future health problems.  AGREE: Multiple dietary modification options and treatment options were discussed and  Amanda Murphy agreed to follow the recommendations documented in the above note.  ARRANGE: Arlet was educated on the importance of frequent visits to treat obesity as outlined per CMS and USPSTF guidelines and agreed to schedule her next follow up appointment today.  I, April Moore, am acting as Location manager for [employer],  PA-C I, Abby Potash, PA-C have reviewed above note and agree with its content

## 2018-04-13 NOTE — Telephone Encounter (Signed)
eror

## 2018-04-14 ENCOUNTER — Ambulatory Visit (INDEPENDENT_AMBULATORY_CARE_PROVIDER_SITE_OTHER): Payer: 59 | Admitting: Family Medicine

## 2018-04-27 ENCOUNTER — Encounter (INDEPENDENT_AMBULATORY_CARE_PROVIDER_SITE_OTHER): Payer: Self-pay

## 2018-04-27 ENCOUNTER — Ambulatory Visit (INDEPENDENT_AMBULATORY_CARE_PROVIDER_SITE_OTHER): Payer: 59 | Admitting: Physician Assistant

## 2018-06-14 ENCOUNTER — Ambulatory Visit (INDEPENDENT_AMBULATORY_CARE_PROVIDER_SITE_OTHER): Payer: Self-pay | Admitting: Family Medicine

## 2019-05-29 ENCOUNTER — Encounter (HOSPITAL_COMMUNITY): Payer: Self-pay | Admitting: Emergency Medicine

## 2019-05-29 ENCOUNTER — Other Ambulatory Visit: Payer: Self-pay

## 2019-05-29 ENCOUNTER — Inpatient Hospital Stay (HOSPITAL_COMMUNITY)
Admission: EM | Admit: 2019-05-29 | Discharge: 2019-06-05 | DRG: 177 | Disposition: A | Discharge status: Home / Self Care | Payer: 59 | Attending: Internal Medicine | Admitting: Internal Medicine

## 2019-05-29 ENCOUNTER — Ambulatory Visit (INDEPENDENT_AMBULATORY_CARE_PROVIDER_SITE_OTHER): Payer: 59

## 2019-05-29 ENCOUNTER — Ambulatory Visit (INDEPENDENT_AMBULATORY_CARE_PROVIDER_SITE_OTHER)
Admission: EM | Admit: 2019-05-29 | Discharge: 2019-05-29 | Disposition: A | Discharge status: Home / Self Care | Payer: 59 | Source: Home / Self Care | Attending: Family Medicine | Admitting: Family Medicine

## 2019-05-29 DIAGNOSIS — J1282 Pneumonia due to coronavirus disease 2019: Secondary | ICD-10-CM | POA: Diagnosis present

## 2019-05-29 DIAGNOSIS — E559 Vitamin D deficiency, unspecified: Secondary | ICD-10-CM | POA: Diagnosis present

## 2019-05-29 DIAGNOSIS — Z793 Long term (current) use of hormonal contraceptives: Secondary | ICD-10-CM

## 2019-05-29 DIAGNOSIS — U071 COVID-19: Principal | ICD-10-CM | POA: Diagnosis present

## 2019-05-29 DIAGNOSIS — G4733 Obstructive sleep apnea (adult) (pediatric): Secondary | ICD-10-CM | POA: Diagnosis present

## 2019-05-29 DIAGNOSIS — Z975 Presence of (intrauterine) contraceptive device: Secondary | ICD-10-CM

## 2019-05-29 DIAGNOSIS — J9601 Acute respiratory failure with hypoxia: Secondary | ICD-10-CM | POA: Diagnosis present

## 2019-05-29 DIAGNOSIS — E282 Polycystic ovarian syndrome: Secondary | ICD-10-CM | POA: Diagnosis present

## 2019-05-29 DIAGNOSIS — J302 Other seasonal allergic rhinitis: Secondary | ICD-10-CM | POA: Diagnosis present

## 2019-05-29 DIAGNOSIS — R0902 Hypoxemia: Secondary | ICD-10-CM

## 2019-05-29 DIAGNOSIS — Z6841 Body Mass Index (BMI) 40.0 and over, adult: Secondary | ICD-10-CM

## 2019-05-29 DIAGNOSIS — Z86718 Personal history of other venous thrombosis and embolism: Secondary | ICD-10-CM

## 2019-05-29 DIAGNOSIS — J1289 Other viral pneumonia: Secondary | ICD-10-CM

## 2019-05-29 DIAGNOSIS — F419 Anxiety disorder, unspecified: Secondary | ICD-10-CM | POA: Diagnosis present

## 2019-05-29 DIAGNOSIS — N179 Acute kidney failure, unspecified: Secondary | ICD-10-CM | POA: Diagnosis present

## 2019-05-29 DIAGNOSIS — Z7982 Long term (current) use of aspirin: Secondary | ICD-10-CM

## 2019-05-29 DIAGNOSIS — Z86711 Personal history of pulmonary embolism: Secondary | ICD-10-CM

## 2019-05-29 DIAGNOSIS — Z79899 Other long term (current) drug therapy: Secondary | ICD-10-CM

## 2019-05-29 DIAGNOSIS — R0602 Shortness of breath: Secondary | ICD-10-CM | POA: Diagnosis not present

## 2019-05-29 DIAGNOSIS — R7401 Elevation of levels of liver transaminase levels: Secondary | ICD-10-CM | POA: Diagnosis present

## 2019-05-29 HISTORY — DX: Other pulmonary embolism without acute cor pulmonale: I26.99

## 2019-05-29 LAB — I-STAT BETA HCG BLOOD, ED (MC, WL, AP ONLY): I-stat hCG, quantitative: <5 m[IU]/mL (ref <5)

## 2019-05-29 LAB — BASIC METABOLIC PANEL
Anion gap: 12 (ref 5–15)
BUN: 7 mg/dL (ref 6–20)
CO2: 26 mmol/L (ref 22–32)
Calcium: 8.7 mg/dL — ABNORMAL LOW (ref 8.9–10.3)
Chloride: 104 mmol/L (ref 98–111)
Creatinine, Ser: 1.09 mg/dL — ABNORMAL HIGH (ref 0.44–1.00)
GFR calc Af Amer: >60 mL/min (ref >60)
GFR calc non Af Amer: >60 mL/min (ref >60)
Glucose, Bld: 98 mg/dL (ref 70–99)
Potassium: 4 mmol/L (ref 3.5–5.1)
Sodium: 142 mmol/L (ref 135–145)

## 2019-05-29 LAB — TROPONIN I (HIGH SENSITIVITY)
Troponin I (High Sensitivity): 7 ng/L (ref <18)
Troponin I (High Sensitivity): 7 ng/L (ref <18)

## 2019-05-29 LAB — CBC
HCT: 44.6 % (ref 36.0–46.0)
Hemoglobin: 14.3 g/dL (ref 12.0–15.0)
MCH: 29.8 pg (ref 26.0–34.0)
MCHC: 32.1 g/dL (ref 30.0–36.0)
MCV: 92.9 fL (ref 80.0–100.0)
Platelets: 337 10*3/uL (ref 150–400)
RBC: 4.8 MIL/uL (ref 3.87–5.11)
RDW: 14.8 % (ref 11.5–15.5)
WBC: 7.2 10*3/uL (ref 4.0–10.5)
nRBC: 0 % (ref 0.0–0.2)

## 2019-05-29 LAB — HEPATIC FUNCTION PANEL
ALT: 75 U/L — ABNORMAL HIGH (ref 0–44)
AST: 66 U/L — ABNORMAL HIGH (ref 15–41)
Albumin: 2.8 g/dL — ABNORMAL LOW (ref 3.5–5.0)
Alkaline Phosphatase: 49 U/L (ref 38–126)
Bilirubin, Direct: 0.2 mg/dL (ref 0.0–0.2)
Indirect Bilirubin: 0.7 mg/dL (ref 0.3–0.9)
Total Bilirubin: 0.9 mg/dL (ref 0.3–1.2)
Total Protein: 7.6 g/dL (ref 6.5–8.1)

## 2019-05-29 LAB — LACTATE DEHYDROGENASE: LDH: 512 U/L — ABNORMAL HIGH (ref 98–192)

## 2019-05-29 LAB — FIBRINOGEN: Fibrinogen: >800 mg/dL — ABNORMAL HIGH (ref 210–475)

## 2019-05-29 LAB — D-DIMER, QUANTITATIVE: D-Dimer, Quant: 1.01 ug/mL-FEU — ABNORMAL HIGH (ref 0.00–0.50)

## 2019-05-29 LAB — TRIGLYCERIDES: Triglycerides: 51 mg/dL (ref <150)

## 2019-05-29 LAB — LACTIC ACID, PLASMA: Lactic Acid, Venous: 0.9 mmol/L (ref 0.5–1.9)

## 2019-05-29 LAB — POC SARS CORONAVIRUS 2 AG -  ED: SARS Coronavirus 2 Ag: POSITIVE — AB

## 2019-05-29 LAB — POC SARS CORONAVIRUS 2 AG: SARS Coronavirus 2 Ag: POSITIVE — AB

## 2019-05-29 MED ORDER — SODIUM CHLORIDE 0.9% FLUSH
3.0000 mL | Freq: Once | INTRAVENOUS | Status: AC
  Administered 2019-05-29: 3 mL via INTRAVENOUS

## 2019-05-29 MED ORDER — ACETAMINOPHEN 500 MG PO TABS
1000.0000 mg | ORAL_TABLET | Freq: Once | ORAL | Status: AC
  Administered 2019-05-29: 1000 mg via ORAL
  Filled 2019-05-29: qty 2

## 2019-05-29 NOTE — ED Notes (Signed)
Notified dr schmitz of patient's symptoms and abnormal vital signs

## 2019-05-29 NOTE — Discharge Instructions (Signed)
Please be seen in the emergency department.

## 2019-05-29 NOTE — ED Triage Notes (Signed)
Patient tested positive for Covid 19 today, now with chest pain, shortness of breath, fever and body aches.  Patient did have Covid swab and chest xray at [facility].

## 2019-05-29 NOTE — ED Notes (Signed)
Patient on oxygen 4L due to low sats at home and in triage

## 2019-05-29 NOTE — ED Provider Notes (Signed)
[place]    CSN: [id number] Arrival date & time: 05/29/19  1625      History   Chief Complaint Chief Complaint  Patient presents with  . Cough    HPI Amanda Murphy is a 38 y.o. female.   She is presenting with 3 to 4 days of worsening symptoms.  She is having myalgias and shortness of breath.  She reports exposure to her niece that had flulike symptoms.  Her Covid was negative.  She does have a history of pulmonary embolism but this was after her travel as well as brain surgery.  She feels her symptoms of gotten worse today.  No improvement with home modalities.  She works as a Marine scientist at a facility.  HPI  Past Medical History:  Diagnosis Date  . Abnormal uterine bleeding (AUB)   . Anemia   . Anxiety   . Asthma    as child - no inhaler - no problem as adult  . Astigmatism   . Fibroids   . H/O diabetes insipidus    s/p craniotomy  . Headache(784.0)    hx - none since surgery  . Hx of blood clots 2016   P. E.  p craniotomy. on Xarelto until 2017.  . Joint pain    ankles and hips  . PCOS (polycystic ovarian syndrome)   . Pneumonia    Hx 3-4 yrs ago   . Pre-diabetes   . Pulmonary embolus ([place])   . Seasonal allergies   . Vision abnormalities   . Vitamin D deficiency   . Wears glasses     Patient Active Problem List   Diagnosis Date Noted  . Abscess 12/17/2017  . Absolute anemia 04/07/2017  . Iron deficiency anemia 01/11/2017  . Insulin resistance 01/11/2017  . Class 3 obesity without serious comorbidity with body mass index (BMI) of 50.0 to 59.9 in adult 01/11/2017  . Class 3 obesity with serious comorbidity and body mass index (BMI) of 50.0 to 59.9 in adult 12/15/2016  . Depression 10/22/2016  . Vitamin D deficiency 06/30/2016  . Routine general medical examination at a health care facility 09/27/2015  . PE (pulmonary embolism)   . Brain tumor ([place]) 10/31/2014  . Situational anxiety 10/19/2014  . Allergic rhinitis 08/16/2014  . Suprasellar  mass 05/15/2014  . Morbid obesity ([place]) 01/18/2014    Past Surgical History:  Procedure Laterality Date  . CRANIOTOMY N/A 10/31/2014   Procedure: CRANIOTOMY FOR RESECTION OF TUMOR;  Surgeon: Jovita Gamma, MD;  Location: [facility];  Service: Neurosurgery;  Laterality: N/A;  Craniotomy for resection of tumor  . DILATATION & CURETTAGE/HYSTEROSCOPY WITH MYOSURE N/A 11/12/2016   Procedure: DILATATION & CURETTAGE/HYSTEROSCOPY WITH MYOSURE;  Surgeon: Marylynn Pearson, MD;  Location: [facility];  Service: Gynecology;  Laterality: N/A;  . DILATION AND CURETTAGE OF UTERUS     myomectomy  . MYOMECTOMY     vaginal -   . WISDOM TOOTH EXTRACTION  2006    OB History   No obstetric history on file.      Home Medications    Prior to Admission medications   Medication Sig Start Date End Date Taking? Authorizing Provider  aspirin EC 325 MG tablet Take 325 mg by mouth daily.   Yes [provider]  levonorgestrel (MIRENA) 20 MCG/24HR IUD 1 each by Intrauterine route once.   Yes [provider]  Prenatal Vit-Fe Fumarate-FA (PRENATAL MULTIVITAMIN) TABS tablet Take 1 tablet by mouth daily.    Yes [provider]  acetaminophen (TYLENOL) 500 MG tablet Take 1,000 mg by mouth every 6 (six) hours as needed for mild pain.    [provider]  calcium carbonate (TUMS - DOSED IN MG ELEMENTAL CALCIUM) 500 MG chewable tablet Chew 1 tablet by mouth as needed. Reported on 07/31/2015    [provider]  docusate sodium (COLACE) 100 MG capsule Take 100 mg by mouth daily as needed for mild constipation.    [provider]  levocetirizine (XYZAL) 5 MG tablet Take 1 tablet (5 mg total) by mouth every evening. Patient taking differently: Take 5 mg by mouth daily as needed.  09/27/15   Hoyt Koch, MD  Melatonin 3 MG TABS Take by mouth.    [provider]  Naltrexone-buPROPion HCl ER 8-90 MG TB12 Take 2 tablets by mouth 2 (two) times daily. 04/06/18    Abby Potash, PA-C  Vitamin D, Ergocalciferol, (DRISDOL) 50000 units CAPS capsule Take 1 capsule (50,000 Units total) by mouth every 3 (three) days. 02/23/18   Starlyn Skeans, MD    Family History Family History  Problem Relation Age of Onset  . Hypertension Mother   . Diabetes Mother   . Obesity Mother   . Ovarian cancer Other   . Breast cancer Other   . Fibroids Other     Social History Social History   Tobacco Use  . Smoking status: Never Smoker  . Smokeless tobacco: Never Used  Substance Use Topics  . Alcohol use: Yes    Alcohol/week: 0.0 standard drinks    Comment: occasional wine  . Drug use: No     Allergies   Patient has no known allergies.   Review of Systems Review of Systems  Constitutional: Positive for fever.  HENT: Negative for dental problem.   Respiratory: Positive for shortness of breath.   Cardiovascular: Negative for chest pain.  Gastrointestinal: Negative for abdominal pain.  Musculoskeletal: Positive for myalgias.  Skin: Negative for color change.  Neurological: Negative for tremors.  Hematological: Negative for adenopathy.     Physical Exam Triage Vital Signs ED Triage Vitals  Enc Vitals Group     BP 05/29/19 1814 (!) 150/92     Pulse Rate 05/29/19 1814 (!) 120     Resp 05/29/19 1814 (!) 36     Temp 05/29/19 1814 (!) 101 F (38.3 C)     Temp Source 05/29/19 1814 Oral     SpO2 05/29/19 1814 91 %     Weight --      Height --      Head Circumference --      Peak Flow --      Pain Score 05/29/19 1808 9     Pain Loc --      Pain Edu? --      Excl. in [place]? --    No data found.  Updated Vital Signs BP (!) 150/92 (BP Location: Left Arm) Comment (BP Location): regular cuff, left forearm  Pulse (!) 120   Temp (!) 101 F (38.3 C) (Oral)   Resp (!) 36   SpO2 91%   Visual Acuity Right Eye Distance:   Left Eye Distance:   Bilateral Distance:    Right Eye Near:   Left Eye Near:    Bilateral Near:     Physical Exam Gen:  NAD, alert, cooperative with exam,  ENT: normal lips, normal nasal mucosa,  Eye: normal EOM, normal conjunctiva and lids CV:  no edema, +2 pedal pulses  Resp: no accessory muscle use, non-labored, air movement seems to be limited, no wheezing or consolidation Skin: no rashes, no areas of induration  Neuro: normal tone, normal sensation to touch Psych:  normal insight, alert and oriented MSK: normal gait, normal strength   UC Treatments / Results  Labs (all labs ordered are listed, but only abnormal results are displayed) Labs Reviewed  POC SARS CORONAVIRUS 2 AG -  ED - Abnormal; Notable for the following components:      Result Value   SARS Coronavirus 2 Ag POSITIVE (*)    All other components within normal limits  POC SARS CORONAVIRUS 2 AG - Abnormal; Notable for the following components:   SARS Coronavirus 2 Ag POSITIVE (*)    All other components within normal limits    EKG   Radiology DG Chest 2 View  Result Date: 05/29/2019 CLINICAL DATA:  Shortness of breath, wheezing and productive cough for 1 week, history asthma EXAM: CHEST - 2 VIEW COMPARISON:  11/26/2014 FINDINGS: Mild enlargement of cardiac silhouette. Mediastinal contours normal. Patchy BILATERAL pulmonary infiltrates most consistent with multifocal pneumonia. No pleural effusion or pneumothorax. Respiratory motion artifacts degrade lateral view. No acute osseous findings. IMPRESSION: Mild enlargement of cardiac silhouette. Patchy BILATERAL airspace infiltrates consistent with multifocal pneumonia, including atypical and viral etiologies. Electronically Signed   By: Lavonia Dana M.D.   On: 05/29/2019 19:21    Procedures Procedures (including critical care time)  Medications Ordered in UC Medications - No data to display  Initial Impression / Assessment and Plan / UC Course  I have reviewed the triage vital signs and the nursing notes.  Pertinent labs & imaging results that were available during my care of the  patient were reviewed by me and considered in my medical decision making (see chart for details).     Ms. Mcferran is a 38 year old female is presenting with fever, myalgias, and fatigue with a Covid positive and an x-ray showing bilateral infiltrates.  Having some tachypnea and tachycardia.  Maintaining oxygen currently but may decompensate as her symptoms may worsen.  Advised that she be seen in the emergency department for further care.  Final Clinical Impressions(s) / UC Diagnoses   Final diagnoses:  Pneumonia due to COVID-19 virus     Discharge Instructions     Please be seen in the emergency department.     ED Prescriptions    None     PDMP not reviewed this encounter.   Rosemarie Ax, MD 05/29/19 [phone]

## 2019-05-29 NOTE — ED Provider Notes (Signed)
[facility] Emergency Department Provider Note MRN:  [id number]  Arrival date & time: 05/29/19     Chief Complaint   Shortness of breath History of Present Illness   Amanda Murphy is a 38 y.o. year-old female with a history of PE presenting to the ED with chief complaint of shortness of breath.  Patient has been having cold-like symptoms for 1 week.  Tested positive for coronavirus yesterday.  Has been experiencing shortness of breath for the past 2 days.  Denies chest pain.  Endorsing fever, chills, general malaise, body aches, nausea.  No vomiting, no diarrhea.  Symptoms constant, no exacerbating relieving factors.  Review of Systems  A complete 10 system review of systems was obtained and all systems are negative except as noted in the HPI and PMH.   Patient's Health History    Past Medical History:  Diagnosis Date  . Abnormal uterine bleeding (AUB)   . Anemia   . Anxiety   . Asthma    as child - no inhaler - no problem as adult  . Astigmatism   . Fibroids   . H/O diabetes insipidus    s/p craniotomy  . Headache(784.0)    hx - none since surgery  . Hx of blood clots 2016   P. E.  p craniotomy. on Xarelto until 2017.  . Joint pain    ankles and hips  . PCOS (polycystic ovarian syndrome)   . Pneumonia    Hx 3-4 yrs ago   . Pre-diabetes   . Pulmonary embolus ([place])   . Seasonal allergies   . Vision abnormalities   . Vitamin D deficiency   . Wears glasses     Past Surgical History:  Procedure Laterality Date  . CRANIOTOMY N/A 10/31/2014   Procedure: CRANIOTOMY FOR RESECTION OF TUMOR;  Surgeon: Jovita Gamma, MD;  Location: [facility];  Service: Neurosurgery;  Laterality: N/A;  Craniotomy for resection of tumor  . DILATATION & CURETTAGE/HYSTEROSCOPY WITH MYOSURE N/A 11/12/2016   Procedure: DILATATION & CURETTAGE/HYSTEROSCOPY WITH MYOSURE;  Surgeon: Marylynn Pearson, MD;  Location: [facility];  Service: Gynecology;  Laterality: N/A;  .  DILATION AND CURETTAGE OF UTERUS     myomectomy  . MYOMECTOMY     vaginal -   . WISDOM TOOTH EXTRACTION  2006    Family History  Problem Relation Age of Onset  . Hypertension Mother   . Diabetes Mother   . Obesity Mother   . Ovarian cancer Other   . Breast cancer Other   . Fibroids Other     Social History   Socioeconomic History  . Marital status: Single    Spouse name: Not on file  . Number of children: 0  . Years of education: BSN  . Highest education level: Not on file  Occupational History  . Occupation: [employer]    Employer: [employer]  Tobacco Use  . Smoking status: Never Smoker  . Smokeless tobacco: Never Used  Substance and Sexual Activity  . Alcohol use: Yes    Alcohol/week: 0.0 standard drinks    Comment: occasional wine  . Drug use: No  . Sexual activity: Yes    Birth control/protection: Condom  Other Topics Concern  . Not on file  Social History Narrative   Patient is single with no children.   Patient is right handed.   Patient has a Copywriter, advertising.   Patient drinks coffee 3 cups a week, tea once daily.   Social Determinants of  Health   Financial Resource Strain:   . Difficulty of Paying Living Expenses: Not on file  Food Insecurity:   . Worried About Charity fundraiser in the Last Year: Not on file  . Ran Out of Food in the Last Year: Not on file  Transportation Needs:   . Lack of Transportation (Medical): Not on file  . Lack of Transportation (Non-Medical): Not on file  Physical Activity:   . Days of Exercise per Week: Not on file  . Minutes of Exercise per Session: Not on file  Stress:   . Feeling of Stress : Not on file  Social Connections:   . Frequency of Communication with Friends and Family: Not on file  . Frequency of Social Gatherings with Friends and Family: Not on file  . Attends Religious Services: Not on file  . Active Member of Clubs or Organizations: Not on file  . Attends Archivist Meetings: Not on file  .  Marital Status: Not on file  Intimate Partner Violence:   . Fear of Current or Ex-Partner: Not on file  . Emotionally Abused: Not on file  . Physically Abused: Not on file  . Sexually Abused: Not on file     Physical Exam  Vital Signs and Nursing Notes reviewed Vitals:   05/29/19 2230 05/29/19 2231  BP: 120/79   Pulse: (!) 111 (!) 111  Resp: (!) 36 (!) 35  Temp:    SpO2: 95% 93%    CONSTITUTIONAL: Well-appearing, NAD NEURO:  Alert and oriented x 3, no focal deficits EYES:  eyes equal and reactive ENT/NECK:  no LAD, no JVD CARDIO: Tachycardic rate, well-perfused, normal S1 and S2 PULM:  CTAB no wheezing or rhonchi GI/GU:  normal bowel sounds, non-distended, non-tender MSK/SPINE:  No gross deformities, no edema SKIN:  no rash, atraumatic PSYCH:  Appropriate speech and behavior  Diagnostic and Interventional Summary    EKG Interpretation  Date/Time:  Monday May 29 2019 20:16:05 EST Ventricular Rate:  122 PR Interval:  120 QRS Duration: 64 QT Interval:  302 QTC Calculation: 430 R Axis:   73 Text Interpretation: Sinus tachycardia Otherwise normal ECG No significant change was found Confirmed by Gerlene Fee [phone]) on 05/29/2019 9:22:49 PM      Labs Reviewed  BASIC METABOLIC PANEL - Abnormal; Notable for the following components:      Result Value   Creatinine, Ser 1.09 (*)    Calcium 8.7 (*)    All other components within normal limits  D-DIMER, QUANTITATIVE (NOT AT [facility]) - Abnormal; Notable for the following components:   D-Dimer, Quant 1.01 (*)    All other components within normal limits  LACTATE DEHYDROGENASE - Abnormal; Notable for the following components:   LDH 512 (*)    All other components within normal limits  FIBRINOGEN - Abnormal; Notable for the following components:   Fibrinogen >800 (*)    All other components within normal limits  HEPATIC FUNCTION PANEL - Abnormal; Notable for the following components:   Albumin 2.8 (*)    AST 66 (*)     ALT 75 (*)    All other components within normal limits  CULTURE, BLOOD (ROUTINE X 2)  CULTURE, BLOOD (ROUTINE X 2)  CBC  LACTIC ACID, PLASMA  TRIGLYCERIDES  LACTIC ACID, PLASMA  PROCALCITONIN  FERRITIN  C-REACTIVE PROTEIN  I-STAT BETA HCG BLOOD, ED ([facility], [facility], [facility] ONLY)  TROPONIN I (HIGH SENSITIVITY)  TROPONIN I (HIGH SENSITIVITY)    No orders to  display    Medications  sodium chloride flush (NS) 0.9 % injection 3 mL (3 mLs Intravenous Given 05/29/19 2237)  acetaminophen (TYLENOL) tablet 1,000 mg (1,000 mg Oral Given 05/29/19 2026)     Procedures  /  Critical Care .Critical Care Performed by: Maudie Flakes, MD Authorized by: Maudie Flakes, MD   Critical care provider statement:    Critical care time (minutes):  33   Critical care was necessary to treat or prevent imminent or life-threatening deterioration of the following conditions: COVID-19 with hypoxia.   Critical care was time spent personally by me on the following activities:  Discussions with consultants, evaluation of patient's response to treatment, examination of patient, ordering and performing treatments and interventions, ordering and review of laboratory studies, ordering and review of radiographic studies, pulse oximetry, re-evaluation of patient's condition, obtaining history from patient or surrogate and review of old charts    ED Course and Medical Decision Making  I have reviewed the triage vital signs and the nursing notes.  Pertinent labs & imaging results that were available during my care of the patient were reviewed by me and considered in my medical decision making (see below for details).     Febrile illness consistent with COVID-19 with hypoxia, patient was with mid to high 80s on room air with her pulse ox, improved to low 90s on 4 L nasal cannula.  Patient's PE occurred 4 years ago and it was provoked by surgery, very low concern for PE at this time.  Symptoms largely explained by COVID-19  infection.  Will admit to hospitalist service.  Barth Kirks. [place]onia Small, [place] [email or web address]  Final Clinical Impressions(s) / ED Diagnoses     ICD-10-CM   1. COVID-19  U07.1   2. Hypoxia  R09.02     ED Discharge Orders    None       Discharge Instructions Discussed with and Provided to Patient:   Discharge Instructions   None       Maudie Flakes, MD 05/30/19 0002

## 2019-05-29 NOTE — ED Notes (Signed)
Nasal swab in lab 

## 2019-05-29 NOTE — ED Triage Notes (Signed)
Onset of symptoms last Monday 05/22/2019.  Initially headache, body ache and chills.  Now, patient has sob, chest discomfort, nauseated, dizzy and light headed today x 3.

## 2019-05-30 ENCOUNTER — Encounter (HOSPITAL_COMMUNITY): Payer: Self-pay | Admitting: Internal Medicine

## 2019-05-30 ENCOUNTER — Inpatient Hospital Stay (HOSPITAL_COMMUNITY): Payer: 59

## 2019-05-30 ENCOUNTER — Telehealth: Payer: Self-pay | Admitting: Unknown Physician Specialty

## 2019-05-30 DIAGNOSIS — U071 COVID-19: Secondary | ICD-10-CM | POA: Diagnosis present

## 2019-05-30 DIAGNOSIS — Z79899 Other long term (current) drug therapy: Secondary | ICD-10-CM | POA: Diagnosis not present

## 2019-05-30 DIAGNOSIS — J9601 Acute respiratory failure with hypoxia: Secondary | ICD-10-CM | POA: Diagnosis present

## 2019-05-30 DIAGNOSIS — E282 Polycystic ovarian syndrome: Secondary | ICD-10-CM | POA: Diagnosis present

## 2019-05-30 DIAGNOSIS — F419 Anxiety disorder, unspecified: Secondary | ICD-10-CM | POA: Diagnosis present

## 2019-05-30 DIAGNOSIS — R0602 Shortness of breath: Secondary | ICD-10-CM | POA: Diagnosis present

## 2019-05-30 DIAGNOSIS — Z86711 Personal history of pulmonary embolism: Secondary | ICD-10-CM | POA: Diagnosis not present

## 2019-05-30 DIAGNOSIS — Z6841 Body Mass Index (BMI) 40.0 and over, adult: Secondary | ICD-10-CM | POA: Diagnosis not present

## 2019-05-30 DIAGNOSIS — G4733 Obstructive sleep apnea (adult) (pediatric): Secondary | ICD-10-CM | POA: Diagnosis present

## 2019-05-30 DIAGNOSIS — J1289 Other viral pneumonia: Secondary | ICD-10-CM | POA: Diagnosis present

## 2019-05-30 DIAGNOSIS — J302 Other seasonal allergic rhinitis: Secondary | ICD-10-CM | POA: Diagnosis present

## 2019-05-30 DIAGNOSIS — Z7982 Long term (current) use of aspirin: Secondary | ICD-10-CM | POA: Diagnosis not present

## 2019-05-30 DIAGNOSIS — N179 Acute kidney failure, unspecified: Secondary | ICD-10-CM | POA: Diagnosis present

## 2019-05-30 DIAGNOSIS — R7401 Elevation of levels of liver transaminase levels: Secondary | ICD-10-CM | POA: Diagnosis present

## 2019-05-30 DIAGNOSIS — Z793 Long term (current) use of hormonal contraceptives: Secondary | ICD-10-CM | POA: Diagnosis not present

## 2019-05-30 DIAGNOSIS — E559 Vitamin D deficiency, unspecified: Secondary | ICD-10-CM | POA: Diagnosis present

## 2019-05-30 DIAGNOSIS — Z86718 Personal history of other venous thrombosis and embolism: Secondary | ICD-10-CM | POA: Diagnosis not present

## 2019-05-30 DIAGNOSIS — Z975 Presence of (intrauterine) contraceptive device: Secondary | ICD-10-CM | POA: Diagnosis not present

## 2019-05-30 LAB — CBC WITH DIFFERENTIAL/PLATELET
Abs Immature Granulocytes: 0.04 10*3/uL (ref 0.00–0.07)
Basophils Absolute: 0 10*3/uL (ref 0.0–0.1)
Basophils Relative: 0 %
Eosinophils Absolute: 0 10*3/uL (ref 0.0–0.5)
Eosinophils Relative: 0 %
HCT: 44.4 % (ref 36.0–46.0)
Hemoglobin: 14.2 g/dL (ref 12.0–15.0)
Immature Granulocytes: 1 %
Lymphocytes Relative: 12 %
Lymphs Abs: 0.9 10*3/uL (ref 0.7–4.0)
MCH: 29.8 pg (ref 26.0–34.0)
MCHC: 32 g/dL (ref 30.0–36.0)
MCV: 93.1 fL (ref 80.0–100.0)
Monocytes Absolute: 0.3 10*3/uL (ref 0.1–1.0)
Monocytes Relative: 3 %
Neutro Abs: 6.7 10*3/uL (ref 1.7–7.7)
Neutrophils Relative %: 84 %
Platelets: 339 10*3/uL (ref 150–400)
RBC: 4.77 MIL/uL (ref 3.87–5.11)
RDW: 15 % (ref 11.5–15.5)
WBC: 8 10*3/uL (ref 4.0–10.5)
nRBC: 0 % (ref 0.0–0.2)

## 2019-05-30 LAB — CREATININE, SERUM
Creatinine, Ser: 1.03 mg/dL — ABNORMAL HIGH (ref 0.44–1.00)
GFR calc Af Amer: >60 mL/min (ref >60)
GFR calc non Af Amer: >60 mL/min (ref >60)

## 2019-05-30 LAB — COMPREHENSIVE METABOLIC PANEL
ALT: 77 U/L — ABNORMAL HIGH (ref 0–44)
AST: 75 U/L — ABNORMAL HIGH (ref 15–41)
Albumin: 2.7 g/dL — ABNORMAL LOW (ref 3.5–5.0)
Alkaline Phosphatase: 54 U/L (ref 38–126)
Anion gap: 15 (ref 5–15)
BUN: 9 mg/dL (ref 6–20)
CO2: 22 mmol/L (ref 22–32)
Calcium: 8.6 mg/dL — ABNORMAL LOW (ref 8.9–10.3)
Chloride: 104 mmol/L (ref 98–111)
Creatinine, Ser: 0.72 mg/dL (ref 0.44–1.00)
GFR calc Af Amer: >60 mL/min (ref >60)
GFR calc non Af Amer: >60 mL/min (ref >60)
Glucose, Bld: 110 mg/dL — ABNORMAL HIGH (ref 70–99)
Potassium: 4.2 mmol/L (ref 3.5–5.1)
Sodium: 141 mmol/L (ref 135–145)
Total Bilirubin: 1.4 mg/dL — ABNORMAL HIGH (ref 0.3–1.2)
Total Protein: 7.1 g/dL (ref 6.5–8.1)

## 2019-05-30 LAB — CBC
HCT: 43.6 % (ref 36.0–46.0)
Hemoglobin: 13.8 g/dL (ref 12.0–15.0)
MCH: 29.6 pg (ref 26.0–34.0)
MCHC: 31.7 g/dL (ref 30.0–36.0)
MCV: 93.6 fL (ref 80.0–100.0)
Platelets: 326 10*3/uL (ref 150–400)
RBC: 4.66 MIL/uL (ref 3.87–5.11)
RDW: 14.9 % (ref 11.5–15.5)
WBC: 8.3 10*3/uL (ref 4.0–10.5)
nRBC: 0 % (ref 0.0–0.2)

## 2019-05-30 LAB — C-REACTIVE PROTEIN
CRP: 20.8 mg/dL — ABNORMAL HIGH (ref <1.0)
CRP: 21.7 mg/dL — ABNORMAL HIGH (ref <1.0)

## 2019-05-30 LAB — PROCALCITONIN: Procalcitonin: <0.1 ng/mL

## 2019-05-30 LAB — HIV ANTIBODY (ROUTINE TESTING W REFLEX): HIV Screen 4th Generation wRfx: NONREACTIVE

## 2019-05-30 LAB — GLUCOSE, CAPILLARY: Glucose-Capillary: 109 mg/dL — ABNORMAL HIGH (ref 70–99)

## 2019-05-30 LAB — FERRITIN
Ferritin: 309 ng/mL — ABNORMAL HIGH (ref 11–307)
Ferritin: 358 ng/mL — ABNORMAL HIGH (ref 11–307)

## 2019-05-30 LAB — LACTIC ACID, PLASMA: Lactic Acid, Venous: 1.7 mmol/L (ref 0.5–1.9)

## 2019-05-30 MED ORDER — SODIUM CHLORIDE 0.9 % IV SOLN
100.0000 mg | Freq: Every day | INTRAVENOUS | Status: AC
  Administered 2019-05-31 – 2019-06-03 (×4): 100 mg via INTRAVENOUS
  Filled 2019-05-30 (×4): qty 20

## 2019-05-30 MED ORDER — ACETAMINOPHEN 650 MG RE SUPP: 650.0000 mg | Freq: Four times a day (QID) | RECTAL | Status: DC | PRN

## 2019-05-30 MED ORDER — IOHEXOL 350 MG/ML SOLN
100.0000 mL | Freq: Once | INTRAVENOUS | Status: AC | PRN
  Administered 2019-05-30: 100 mL via INTRAVENOUS

## 2019-05-30 MED ORDER — ENOXAPARIN SODIUM 40 MG/0.4ML ~~LOC~~ SOLN
40.0000 mg | Freq: Every day | SUBCUTANEOUS | Status: DC
  Administered 2019-05-30: 40 mg via SUBCUTANEOUS
  Filled 2019-05-30: qty 0.4

## 2019-05-30 MED ORDER — ONDANSETRON HCL 4 MG PO TABS: 4.0000 mg | ORAL_TABLET | Freq: Four times a day (QID) | ORAL | Status: DC | PRN

## 2019-05-30 MED ORDER — ENOXAPARIN SODIUM 40 MG/0.4ML ~~LOC~~ SOLN
40.0000 mg | Freq: Once | SUBCUTANEOUS | Status: AC
  Administered 2019-05-30: 40 mg via SUBCUTANEOUS
  Filled 2019-05-30: qty 0.4

## 2019-05-30 MED ORDER — ZINC SULFATE 220 (50 ZN) MG PO CAPS
220.0000 mg | ORAL_CAPSULE | Freq: Every day | ORAL | Status: DC
  Administered 2019-05-30 – 2019-06-05 (×7): 220 mg via ORAL
  Filled 2019-05-30 (×7): qty 1

## 2019-05-30 MED ORDER — ORAL CARE MOUTH RINSE
15.0000 mL | Freq: Two times a day (BID) | OROMUCOSAL | Status: DC
  Administered 2019-05-30 – 2019-06-04 (×11): 15 mL via OROMUCOSAL

## 2019-05-30 MED ORDER — ASPIRIN EC 325 MG PO TBEC
325.0000 mg | DELAYED_RELEASE_TABLET | Freq: Every day | ORAL | Status: DC
  Administered 2019-05-30 – 2019-05-31 (×2): 325 mg via ORAL
  Filled 2019-05-30 (×2): qty 1

## 2019-05-30 MED ORDER — SODIUM CHLORIDE 0.9% IV SOLUTION: Freq: Once | INTRAVENOUS | Status: AC

## 2019-05-30 MED ORDER — SODIUM CHLORIDE 0.9% IV SOLUTION: Freq: Once | INTRAVENOUS | Status: DC

## 2019-05-30 MED ORDER — ACETAMINOPHEN 325 MG PO TABS
650.0000 mg | ORAL_TABLET | Freq: Once | ORAL | Status: AC
  Administered 2019-05-30: 650 mg via ORAL
  Filled 2019-05-30: qty 2

## 2019-05-30 MED ORDER — ACETAMINOPHEN 325 MG PO TABS
650.0000 mg | ORAL_TABLET | Freq: Four times a day (QID) | ORAL | Status: DC | PRN
  Administered 2019-05-31 – 2019-06-03 (×3): 650 mg via ORAL
  Filled 2019-05-30 (×3): qty 2

## 2019-05-30 MED ORDER — SODIUM CHLORIDE 0.9 % IV SOLN
200.0000 mg | Freq: Once | INTRAVENOUS | Status: AC
  Administered 2019-05-30: 200 mg via INTRAVENOUS
  Filled 2019-05-30: qty 200

## 2019-05-30 MED ORDER — DEXAMETHASONE SODIUM PHOSPHATE 10 MG/ML IJ SOLN
6.0000 mg | Freq: Every day | INTRAMUSCULAR | Status: DC
  Administered 2019-05-30 – 2019-05-31 (×2): 6 mg via INTRAVENOUS
  Filled 2019-05-30 (×2): qty 1

## 2019-05-30 MED ORDER — ONDANSETRON HCL 4 MG/2ML IJ SOLN: 4.0000 mg | Freq: Four times a day (QID) | INTRAMUSCULAR | Status: DC | PRN

## 2019-05-30 MED ORDER — TOCILIZUMAB 400 MG/20ML IV SOLN
800.0000 mg | Freq: Once | INTRAVENOUS | Status: AC
  Administered 2019-05-30: 800 mg via INTRAVENOUS
  Filled 2019-05-30: qty 40

## 2019-05-30 MED ORDER — ASCORBIC ACID 500 MG PO TABS
500.0000 mg | ORAL_TABLET | Freq: Every day | ORAL | Status: DC
  Administered 2019-05-30 – 2019-06-05 (×7): 500 mg via ORAL
  Filled 2019-05-30 (×7): qty 1

## 2019-05-30 MED ORDER — VITAMIN D 25 MCG (1000 UNIT) PO TABS
1000.0000 [IU] | ORAL_TABLET | Freq: Every day | ORAL | Status: DC
  Administered 2019-05-30 – 2019-06-05 (×7): 1000 [IU] via ORAL
  Filled 2019-05-30 (×7): qty 1

## 2019-05-30 MED ORDER — ENOXAPARIN SODIUM 80 MG/0.8ML ~~LOC~~ SOLN: 80.0000 mg | SUBCUTANEOUS | Status: DC

## 2019-05-30 NOTE — ED Notes (Signed)
Noted pt O2 sats 87% while talking on phone with sister, O2 increased to 4L.

## 2019-05-30 NOTE — Progress Notes (Addendum)
Amanda Murphy  [id number] DOB: 12-09-1980 DOA: 05/29/2019 PCP: Hoyt Koch, MD   Brief Narrative: 38 year old with past medical history significant for suprasellar mass status post resection, history of PE 2016, currently on aspirin, morbid obesity who presents complaining of worsening shortness of breath, back pain, generalized fatigue and malaise for over a week.  Patient was found to be hypoxic requiring 3 L of oxygen, febrile with temperature 102, Covid test was positive.05-29-2019   Assessment & Plan:   Principal Problem:   Acute respiratory failure with hypoxia [facility]) Active Problems:   Morbid obesity ([place])   Pneumonia due to COVID-19 virus  1-Acute Hypoxic Respiratory Failure; secondary to Covid pneumonia -CT angio negative for PE, showed widespread multifocal pneumonia. -CRP 20---21, D-dimer 1.1 -Continue with Remdesivir and Dexamethasone/.  -The treatment plan and use of Actemra  and known side effects were discussed with patient/ she was clearly explained that there is no proven definitive treatment for COVID-19 infection, any medications used here are based on published clinical articles/anecdotal data which are not peer-reviewed or randomized control trials.  Complete risks and long-term side effects are unknown, however in the best clinical judgment they seem to be of some clinical benefit rather than medical risks.  Patient agree with the treatment plan and want to receive the given medications. -Will give one dose of Actemra, she is on 4 L, risk factor for severe Covid Obesity, CRP elevated at 21. Pro-calcitonin less 0.10 -Vitamin D , c and zinc ordered.  Addendum:  Patient became more hypoxic now on 8 L oxygen from 4 L. I discussed with patient about convalescent plasma , risk and benefit. She wants to proceed with it. I have order one unit of convalescent plasma.   COVID-19 Labs  Recent Labs    05/29/19 2216 05/30/19 0656  DDIMER  1.01*  --   FERRITIN 309* 358*  LDH 512*  --   CRP 20.8* 21.7*   2-mild transaminases: Likely related to COVID-19 Follow trend.   3-History of PE;  On Lovenox for DVT prophylaxis. Will ask pharmacy to dose by weight.   History of suppra-sellar mass. SP sx.   AKI: Patient with a creatinine of 1.09 after IV fluids creatinine has decreased to 0.7  No results found for: SARSCOV2NAA Estimated body mass index is 56.82 kg/m as calculated from the following:   Height as of 04/06/18: 5\' 4"  (1.626 m).   Weight as of 04/06/18: 150.1 kg.   DVT prophylaxis: Lovenox Code Status: Full code Family Communication: Care discussed with patient.  Disposition Plan: Remain in the hospital for treatment of acute hypoxic respiratory failure and Covid pneumonia Consultants:   None  Procedures:   None  Antimicrobials: Others -Remdesivir 12-22  Subjective: She is alert, she report cough, she reports shortness of breath, feel that she is breathing heavy.  Objective: Vitals:   05/30/19 0625 05/30/19 0630 05/30/19 0632 05/30/19 0700  BP:  117/82  120/63  Pulse:  (!) 113  (!) 108  Resp:  (!) 34  (!) 43  Temp: 100.3 F (37.9 C)     TempSrc: Oral     SpO2:  93% 94% 94%    Intake/Output Summary (Last 24 hours) at 05/30/2019 0800 Last data filed at 05/30/2019 [id number] Gross per 24 hour  Intake 250 ml  Output --  Net 250 ml   There were no vitals filed for this visit.  Examination:  General exam: Appears calm and comfortable , morbidly  obese Respiratory system: Bilateral rhonchorous. Cardiovascular system: S1 & S2 heard, RRR. No JVD, murmurs, rubs, gallops or clicks. No pedal edema. Gastrointestinal system: Abdomen is nondistended, soft and nontender. No organomegaly or masses felt. Normal bowel sounds heard. Central nervous system: Alert and oriented. No focal neurological deficits. Extremities: Symmetric 5 x 5 power. Skin: No rashes, lesions or ulcers    Data Reviewed: I have  personally reviewed following labs and imaging studies  CBC: Recent Labs  Lab 05/29/19 2024 05/30/19 0450 05/30/19 0656  WBC 7.2 8.3 8.0  NEUTROABS  --   --  6.7  HGB 14.3 13.8 14.2  HCT 44.6 43.6 44.4  MCV 92.9 93.6 93.1  PLT 337 326 [id number]   Basic Metabolic Panel: Recent Labs  Lab 05/29/19 2024 05/30/19 0450 05/30/19 0656  NA 142  --  141  K 4.0  --  4.2  CL 104  --  104  CO2 26  --  22  GLUCOSE 98  --  110*  BUN 7  --  9  CREATININE 1.09* 1.03* 0.72  CALCIUM 8.7*  --  8.6*   GFR: CrCl cannot be calculated (Unknown ideal weight.). Liver Function Tests: Recent Labs  Lab 05/29/19 2216 05/30/19 0656  AST 66* 75*  ALT 75* 77*  ALKPHOS 49 54  BILITOT 0.9 1.4*  PROT 7.6 7.1  ALBUMIN 2.8* 2.7*   No results for input(s): LIPASE, AMYLASE in the last 168 hours. No results for input(s): AMMONIA in the last 168 hours. Coagulation Profile: No results for input(s): INR, PROTIME in the last 168 hours. Cardiac Enzymes: No results for input(s): CKTOTAL, CKMB, CKMBINDEX, TROPONINI in the last 168 hours. BNP (last 3 results) No results for input(s): PROBNP in the last 8760 hours. HbA1C: No results for input(s): HGBA1C in the last 72 hours. CBG: No results for input(s): GLUCAP in the last 168 hours. Lipid Profile: Recent Labs    05/29/19 2145  TRIG 51   Thyroid Function Tests: No results for input(s): TSH, T4TOTAL, FREET4, T3FREE, THYROIDAB in the last 72 hours. Anemia Panel: Recent Labs    05/29/19 2216  FERRITIN 309*   Sepsis Labs: Recent Labs  Lab 05/29/19 2216 05/30/19 0026  PROCALCITON <0.10  --   LATICACIDVEN 0.9 1.7    Recent Results (from the past 240 hour(s))  Blood Culture (routine x 2)     Status: None (Preliminary result)   Collection Time: 05/29/19 10:15 PM   Specimen: BLOOD  Result Value Ref Range Status   Specimen Description BLOOD RIGHT ANTECUBITAL  Final   Special Requests   Final    BOTTLES DRAWN AEROBIC AND ANAEROBIC Blood Culture  adequate volume   Culture   Final    NO GROWTH < 12 HOURS Performed at [facility], [address] [place], [place] [address]    Report Status PENDING  Incomplete         Radiology Studies: DG Chest 2 View  Result Date: 05/29/2019 CLINICAL DATA:  Shortness of breath, wheezing and productive cough for 1 week, history asthma EXAM: CHEST - 2 VIEW COMPARISON:  11/26/2014 FINDINGS: Mild enlargement of cardiac silhouette. Mediastinal contours normal. Patchy BILATERAL pulmonary infiltrates most consistent with multifocal pneumonia. No pleural effusion or pneumothorax. Respiratory motion artifacts degrade lateral view. No acute osseous findings. IMPRESSION: Mild enlargement of cardiac silhouette. Patchy BILATERAL airspace infiltrates consistent with multifocal pneumonia, including atypical and viral etiologies. Electronically Signed   By: Lavonia Dana M.D.   On: 05/29/2019 19:21  CT ANGIO CHEST PE W OR WO CONTRAST  Result Date: 05/30/2019 CLINICAL DATA:  Shortness of breath with fever.  COVID-19 positive EXAM: CT ANGIOGRAPHY CHEST WITH CONTRAST TECHNIQUE: Multidetector CT imaging of the chest was performed using the standard protocol during bolus administration of intravenous contrast. Multiplanar CT image reconstructions and MIPs were obtained to evaluate the vascular anatomy. CONTRAST:  75 mL OMNIPAQUE IOHEXOL 350 MG/ML SOLN COMPARISON:  CT angiogram chest November 23, 2014; chest radiograph May 29 2019 FINDINGS: Cardiovascular: There is no demonstrable pulmonary embolus. There is no thoracic aortic aneurysm or dissection. The visualized great vessels appear unremarkable. Note that the right innominate and left common carotid arteries arise as a common trunk, an anatomic variant. There is no pericardial effusion or pericardial thickening. Mediastinum/Nodes: Thyroid appears unremarkable. There is no demonstrable adenopathy. No esophageal lesions are appreciable. Lungs/Pleura: There is  widespread airspace opacity throughout the lungs bilaterally involving all lobes in segments to varying degrees. There are scattered areas of mild consolidation; the bulk of the opacity bilaterally is more of a ground-glass type appearance. No pleural effusions are evident. Note that consolidation is most notable in the medial lower lobe regions bilaterally. Upper Abdomen: There is hepatic steatosis. Visualized upper abdominal structures appear unremarkable. Musculoskeletal: There are no blastic or lytic bone lesions. No chest wall lesions are evident. Review of the MIP images confirms the above findings. IMPRESSION: 1. No demonstrable pulmonary embolus. No thoracic aortic aneurysm or dissection. 2.  Widespread multifocal pneumonia.  No pleural effusions. 3.  No evident adenopathy. 4.  Hepatic steatosis. Electronically Signed   By: Lowella Grip III M.D.   On: 05/30/2019 07:21        Scheduled Meds: . aspirin EC  325 mg Oral Daily  . dexamethasone (DECADRON) injection  6 mg Intravenous Daily  . enoxaparin (LOVENOX) injection  40 mg Subcutaneous Daily   Continuous Infusions: . [START ON 05/31/2019] remdesivir 100 mg in NS 100 mL    . tocilizumab (ACTEMRA) IV       LOS: 0 days    Time spent: 35 minutes    Ilay Capshaw A Deundra Furber, MD [facility]   If 7PM-7AM, please contact night-coverage [email or web address] Password [email or web address] 05/30/2019, 8:00 AM

## 2019-05-30 NOTE — ED Notes (Signed)
Lunch Tray Ordered @ 1124. 

## 2019-05-30 NOTE — ED Notes (Signed)
Noted pt continued to sat at 87% on 4L O2. Sent message to Dr. Tyrell Antonio concerning change, responded immediately and came to see pt. OK to increase O2 to 8L for transport to [place], likely related to increased exertion while speaking on phone and eating in room. Sat 91% on 8L.

## 2019-05-30 NOTE — ED Notes (Signed)
[facility] has been called for this pt

## 2019-05-30 NOTE — Progress Notes (Signed)
I called and updated the patient's mother, Ebony Hail, regarding the patient's condition.  All questions answered.  I also called and left a voicemail with the patient's sister.  Patient is also able to call family at this time as well.

## 2019-05-30 NOTE — Telephone Encounter (Signed)
Called to discuss with patient about Covid symptoms and the use of bamlanivimab, a monoclonal antibody infusion for those with mild to moderate Covid symptoms and at a high risk of hospitalization.  Pt is qualified for this infusion at the [facility] infusion center due to [employer] left to call back

## 2019-05-30 NOTE — ED Notes (Signed)
Breakfast Ordered 

## 2019-05-30 NOTE — Progress Notes (Addendum)
  PROGRESS NOTE    Amanda Murphy  [id number] DOB: [birth year] DOA: 05/29/2019 PCP: Hoyt Koch, MD   Brief Narrative:   Patient arrived safely from previous facility, has now agreed to convalescent plasma, will transfuse tonight per protocol. Discussed convalescent plasma with patient and family at bedside over the phone again this evening - Dr Tyrell Antonio previously discusses as well and patient has consented. Convalescent plasma has been ordered.  Given the patient's severe, life threatening illness from COVID 19 it is reasonable to treat with convalescent plasma. This patient meets criteria for convalescent plasma administration according to FDA's National Expanded Access Treatment Protocol:  Laboratory confirmed COVID-19  Severe or immediately life-threatening COVID-19: . Severe disease is defined as one or more of the following: . shortness of breath (dyspnea), . respiratory frequency ? 30/min, . blood oxygen saturation ? 93%, . partial pressure of arterial oxygen to fraction of inspired oxygen ratio < 300, . lung infiltrates > 50% within 24 to 48 hours . Life-threatening disease is defined as one or more of the following: respiratory failure, septic shock, multiple organ dysfunction or failure  The patient has been informed of the investigational nature of convalescent plasma which has not yet been shown to be a safe and effective treatment of COVID-19.  Written consent has been obtained, see chart.  Assessment & Plan:   Principal Problem:   Acute respiratory failure with hypoxia ([facility]) Active Problems:   Morbid obesity ([place])   Pneumonia due to COVID-19 virus    LOS: 0 days   Time spent: 15 min  Amanda Ishikawa, DO [facility]  If 7PM-7AM, please contact night-coverage [email or web address] Password [email or web address] 05/30/2019, 6:06 PM

## 2019-05-30 NOTE — ED Notes (Signed)
Patient transported to CT 

## 2019-05-30 NOTE — H&P (Signed)
History and Physical    [name] [id number] DOB: Jul 23, 1980 DOA: 05/29/2019  PCP: Amanda Koch, MD  Patient coming from: Home.  Chief Complaint: Shortness of breath.  Body ache and fever.  HPI: Amanda Murphy is a 38 y.o. female with history of suprasellar mass status post resection with history of pulmonary embolism in 2016 presently on aspirin, morbid obesity has been experiencing shortness of breath upper back pain generalized fatigue malaise over the last 1 week which has been progressively worsening.  Given the symptoms patient presented to the urgent care center.  Denies any nausea vomiting or diarrhea.  Denies chest pain.  Patient was referred to the ER.  ED Course: In the ER patient is febrile with temperature of 102 F.  Hypoxic requiring 3 L oxygen chest x-ray shows bilateral infiltrates Covid test was positive.  Patient admitted for further management of acute respiratory failure hypoxia secondary to Covid pneumonia.  Labs show creatinine of 1.09 CRP of 20.8 ferritin 309 procalcitonin less than 0.10 AST is 66 and ALT 75.  Albumin 2.8.  Patient started on Decadron remdesivir admitted for further management of her acute respiratory failure with hypoxia secondary to COVID-19 infection.  Review of Systems: As per HPI, rest all negative.   Past Medical History:  Diagnosis Date  . Abnormal uterine bleeding (AUB)   . Anemia   . Anxiety   . Asthma    as child - no inhaler - no problem as adult  . Astigmatism   . Fibroids   . H/O diabetes insipidus    s/p craniotomy  . Headache(784.0)    hx - none since surgery  . Hx of blood clots 2016   P. E.  p craniotomy. on Xarelto until 2017.  . Joint pain    ankles and hips  . PCOS (polycystic ovarian syndrome)   . Pneumonia    Hx 3-4 yrs ago   . Pre-diabetes   . Pulmonary embolus ([place])   . Seasonal allergies   . Vision abnormalities   . Vitamin D deficiency   . Wears glasses     Past Surgical History:    Procedure Laterality Date  . CRANIOTOMY N/A 10/31/2014   Procedure: CRANIOTOMY FOR RESECTION OF TUMOR;  Surgeon: Jovita Gamma, MD;  Location: [facility];  Service: Neurosurgery;  Laterality: N/A;  Craniotomy for resection of tumor  . DILATATION & CURETTAGE/HYSTEROSCOPY WITH MYOSURE N/A 11/12/2016   Procedure: DILATATION & CURETTAGE/HYSTEROSCOPY WITH MYOSURE;  Surgeon: Marylynn Pearson, MD;  Location: [facility];  Service: Gynecology;  Laterality: N/A;  . DILATION AND CURETTAGE OF UTERUS     myomectomy  . MYOMECTOMY     vaginal -   . WISDOM TOOTH EXTRACTION  2006     reports that she has never smoked. She has never used smokeless tobacco. She reports current alcohol use. She reports that she does not use drugs.  No Known Allergies  Family History  Problem Relation Age of Onset  . Hypertension Mother   . Diabetes Mother   . Obesity Mother   . Ovarian cancer Other   . Breast cancer Other   . Fibroids Other     Prior to Admission medications   Medication Sig Start Date End Date Taking? Authorizing Provider  acetaminophen (TYLENOL) 500 MG tablet Take 1,000 mg by mouth every 6 (six) hours as needed for mild pain.    [provider]  aspirin EC 325 MG tablet Take 325 mg by mouth daily.  [provider]  calcium carbonate (TUMS - DOSED IN MG ELEMENTAL CALCIUM) 500 MG chewable tablet Chew 1 tablet by mouth as needed. Reported on 07/31/2015    [provider]  docusate sodium (COLACE) 100 MG capsule Take 100 mg by mouth daily as needed for mild constipation.    [provider]  levocetirizine (XYZAL) 5 MG tablet Take 1 tablet (5 mg total) by mouth every evening. Patient taking differently: Take 5 mg by mouth daily as needed.  09/27/15   Amanda Koch, MD  levonorgestrel (MIRENA) 20 MCG/24HR IUD 1 each by Intrauterine route once.    [provider]  Melatonin 3 MG TABS Take by mouth.    [provider]  Naltrexone-buPROPion HCl ER  8-90 MG TB12 Take 2 tablets by mouth 2 (two) times daily. 04/06/18   Abby Potash, PA-C  Prenatal Vit-Fe Fumarate-FA (PRENATAL MULTIVITAMIN) TABS tablet Take 1 tablet by mouth daily.     [provider]  Vitamin D, Ergocalciferol, (DRISDOL) 50000 units CAPS capsule Take 1 capsule (50,000 Units total) by mouth every 3 (three) days. 02/23/18   Starlyn Skeans, MD    Physical Exam: Constitutional: Moderately built and nourished. Vitals:   05/30/19 0300 05/30/19 0319 05/30/19 0330 05/30/19 0400  BP: 119/62  114/83 120/86  Pulse:  (!) 120 (!) 118 (!) 120  Resp: (!) 44 (!) 44 (!) 32 (!) 41  Temp:      TempSrc:      SpO2:  93% 94% 94%   Eyes: Anicteric no pallor. ENMT: No discharge from the ears eyes nose or mouth. Neck: No rhonchi or crepitations. Respiratory: No rhonchi or crepitations. Cardiovascular: S1-S2 heard. Abdomen: Soft nontender bowel sounds present. Musculoskeletal: No edema. Skin: No rash. Neurologic: Alert awake oriented to time place and person.  Moves all extremities. Psychiatric: Appears normal per normal affect.   Labs on Admission: I have personally reviewed following labs and imaging studies  CBC: Recent Labs  Lab 05/29/19 2024  WBC 7.2  HGB 14.3  HCT 44.6  MCV 92.9  PLT [id number]   Basic Metabolic Panel: Recent Labs  Lab 05/29/19 2024  NA 142  K 4.0  CL 104  CO2 26  GLUCOSE 98  BUN 7  CREATININE 1.09*  CALCIUM 8.7*   GFR: CrCl cannot be calculated (Unknown ideal weight.). Liver Function Tests: Recent Labs  Lab 05/29/19 2216  AST 66*  ALT 75*  ALKPHOS 49  BILITOT 0.9  PROT 7.6  ALBUMIN 2.8*   No results for input(s): LIPASE, AMYLASE in the last 168 hours. No results for input(s): AMMONIA in the last 168 hours. Coagulation Profile: No results for input(s): INR, PROTIME in the last 168 hours. Cardiac Enzymes: No results for input(s): CKTOTAL, CKMB, CKMBINDEX, TROPONINI in the last 168 hours. BNP (last 3 results) No results for  input(s): PROBNP in the last 8760 hours. HbA1C: No results for input(s): HGBA1C in the last 72 hours. CBG: No results for input(s): GLUCAP in the last 168 hours. Lipid Profile: Recent Labs    05/29/19 2145  TRIG 51   Thyroid Function Tests: No results for input(s): TSH, T4TOTAL, FREET4, T3FREE, THYROIDAB in the last 72 hours. Anemia Panel: Recent Labs    05/29/19 2216  FERRITIN 309*   Urine analysis:    Component Value Date/Time   COLORURINE YELLOW 11/25/2014 1500   APPEARANCEUR CLEAR 11/25/2014 1500   LABSPEC 1.008 11/25/2014 1500   PHURINE 6.5 11/25/2014 1500   GLUCOSEU NEGATIVE 11/25/2014 1500  HGBUR NEGATIVE 11/25/2014 1500   BILIRUBINUR NEGATIVE 11/25/2014 1500   KETONESUR NEGATIVE 11/25/2014 1500   PROTEINUR NEGATIVE 11/25/2014 1500   UROBILINOGEN 0.2 11/25/2014 1500   NITRITE NEGATIVE 11/25/2014 1500   LEUKOCYTESUR NEGATIVE 11/25/2014 1500   Sepsis Labs: @LABRCNTIP (procalcitonin:4,lacticidven:4) )No results found for this or any previous visit (from the past 240 hour(s)).   Radiological Exams on Admission: DG Chest 2 View  Result Date: 05/29/2019 CLINICAL DATA:  Shortness of breath, wheezing and productive cough for 1 week, history asthma EXAM: CHEST - 2 VIEW COMPARISON:  11/26/2014 FINDINGS: Mild enlargement of cardiac silhouette. Mediastinal contours normal. Patchy BILATERAL pulmonary infiltrates most consistent with multifocal pneumonia. No pleural effusion or pneumothorax. Respiratory motion artifacts degrade lateral view. No acute osseous findings. IMPRESSION: Mild enlargement of cardiac silhouette. Patchy BILATERAL airspace infiltrates consistent with multifocal pneumonia, including atypical and viral etiologies. Electronically Signed   By: Lavonia Dana M.D.   On: 05/29/2019 19:21    EKG: Independently reviewed.  Sinus tachycardia.  Assessment/Plan Principal Problem:   Acute respiratory failure with hypoxia (HCC) Active Problems:   Morbid obesity  ([place])   Pneumonia due to COVID-19 virus    1. Acute respiratory failure with hypoxia secondary to COVID-19 infection for which I have started patient on IV remdesivir and Decadron.  Closely monitor respiratory status and inflammatory markers.  Patient has consented for Actemra if required after explaining patient's this is an off label use and also contraindications. 2. History of pulmonary embolism in 2016 presently on aspirin.  Check CT angiogram of the chest. 3. Mild elevated LFTs follow LFTs.  Denies abdominal pain nausea vomiting. 4. Low albumin levels -will need further work-up. 5. History of suprasellar mass resection.  Since patient is acute respiratory failure with Covid infection will need more than 2 midnight stay in inpatient status.   DVT prophylaxis: Lovenox. Code Status: Full code. Family Communication: Discussed with patient. Disposition Plan: Home. Consults called: None. Admission status: Inpatient.   Rise Patience MD [facility] Pager [phone].  If 7PM-7AM, please contact night-coverage [email or web address] Password [facility]  05/30/2019, 4:32 AM

## 2019-05-30 NOTE — ED Notes (Signed)
Offered to assist pt to bed and encourage to lie prone in bed, pt declined at this time, is comfortable in chair at this time

## 2019-05-30 NOTE — ED Notes (Addendum)
Spoke with Dr. Tyrell Antonio about change in tx plan d/t pt becoming increasingly hypoxic and now requiring 8L O2, plans for pt to receive plasma at [facility], MD at bedside and spoke to pt about this. [place] RN called and updated about status change and new tx plan.

## 2019-05-31 DIAGNOSIS — J1289 Other viral pneumonia: Secondary | ICD-10-CM

## 2019-05-31 DIAGNOSIS — U071 COVID-19: Principal | ICD-10-CM

## 2019-05-31 LAB — CBC
HCT: 44.7 % (ref 36.0–46.0)
Hemoglobin: 13.9 g/dL (ref 12.0–15.0)
MCH: 29.2 pg (ref 26.0–34.0)
MCHC: 31.1 g/dL (ref 30.0–36.0)
MCV: 93.9 fL (ref 80.0–100.0)
Platelets: 358 10*3/uL (ref 150–400)
RBC: 4.76 MIL/uL (ref 3.87–5.11)
RDW: 14.9 % (ref 11.5–15.5)
WBC: 7 10*3/uL (ref 4.0–10.5)
nRBC: 0.3 % — ABNORMAL HIGH (ref 0.0–0.2)

## 2019-05-31 LAB — BPAM FFP
Blood Product Expiration Date: 202012231721
Blood Product Expiration Date: 202012231721
ISSUE DATE / TIME: 202012222049
ISSUE DATE / TIME: 202012222049
Unit Type and Rh: 6200
Unit Type and Rh: 6200

## 2019-05-31 LAB — GLUCOSE, CAPILLARY
Glucose-Capillary: 109 mg/dL — ABNORMAL HIGH (ref 70–99)
Glucose-Capillary: 113 mg/dL — ABNORMAL HIGH (ref 70–99)
Glucose-Capillary: 161 mg/dL — ABNORMAL HIGH (ref 70–99)

## 2019-05-31 LAB — PREPARE FRESH FROZEN PLASMA

## 2019-05-31 LAB — COMPREHENSIVE METABOLIC PANEL
ALT: 85 U/L — ABNORMAL HIGH (ref 0–44)
AST: 77 U/L — ABNORMAL HIGH (ref 15–41)
Albumin: 3 g/dL — ABNORMAL LOW (ref 3.5–5.0)
Alkaline Phosphatase: 55 U/L (ref 38–126)
Anion gap: 8 (ref 5–15)
BUN: 14 mg/dL (ref 6–20)
CO2: 27 mmol/L (ref 22–32)
Calcium: 8.7 mg/dL — ABNORMAL LOW (ref 8.9–10.3)
Chloride: 102 mmol/L (ref 98–111)
Creatinine, Ser: 0.72 mg/dL (ref 0.44–1.00)
GFR calc Af Amer: >60 mL/min (ref >60)
GFR calc non Af Amer: >60 mL/min (ref >60)
Glucose, Bld: 101 mg/dL — ABNORMAL HIGH (ref 70–99)
Potassium: 4 mmol/L (ref 3.5–5.1)
Sodium: 137 mmol/L (ref 135–145)
Total Bilirubin: 0.7 mg/dL (ref 0.3–1.2)
Total Protein: 7.9 g/dL (ref 6.5–8.1)

## 2019-05-31 LAB — PHOSPHORUS: Phosphorus: 3.3 mg/dL (ref 2.5–4.6)

## 2019-05-31 LAB — D-DIMER, QUANTITATIVE: D-Dimer, Quant: 1.19 ug/mL-FEU — ABNORMAL HIGH (ref 0.00–0.50)

## 2019-05-31 LAB — MAGNESIUM: Magnesium: 2.5 mg/dL — ABNORMAL HIGH (ref 1.7–2.4)

## 2019-05-31 LAB — C-REACTIVE PROTEIN: CRP: 20.9 mg/dL — ABNORMAL HIGH (ref <1.0)

## 2019-05-31 MED ORDER — TOCILIZUMAB 400 MG/20ML IV SOLN
800.0000 mg | Freq: Once | INTRAVENOUS | Status: AC
  Administered 2019-05-31: 800 mg via INTRAVENOUS
  Filled 2019-05-31: qty 40

## 2019-05-31 MED ORDER — FUROSEMIDE 10 MG/ML IJ SOLN
40.0000 mg | Freq: Once | INTRAMUSCULAR | Status: AC
  Administered 2019-05-31: 40 mg via INTRAVENOUS
  Filled 2019-05-31: qty 4

## 2019-05-31 MED ORDER — METHYLPREDNISOLONE SODIUM SUCC 125 MG IJ SOLR
60.0000 mg | Freq: Two times a day (BID) | INTRAMUSCULAR | Status: DC
  Administered 2019-05-31 – 2019-06-04 (×9): 60 mg via INTRAVENOUS
  Filled 2019-05-31 (×9): qty 2

## 2019-05-31 MED ORDER — ENOXAPARIN SODIUM 80 MG/0.8ML ~~LOC~~ SOLN
80.0000 mg | Freq: Two times a day (BID) | SUBCUTANEOUS | Status: DC
  Administered 2019-05-31 – 2019-06-05 (×11): 80 mg via SUBCUTANEOUS
  Filled 2019-05-31 (×11): qty 0.8

## 2019-05-31 MED ORDER — FAMOTIDINE 20 MG PO TABS
20.0000 mg | ORAL_TABLET | Freq: Every day | ORAL | Status: DC
  Administered 2019-05-31 – 2019-06-05 (×6): 20 mg via ORAL
  Filled 2019-05-31 (×6): qty 1

## 2019-05-31 MED ORDER — INSULIN ASPART 100 UNIT/ML ~~LOC~~ SOLN
0.0000 [IU] | Freq: Three times a day (TID) | SUBCUTANEOUS | Status: DC
  Administered 2019-06-01: 2 [IU] via SUBCUTANEOUS
  Administered 2019-06-02 – 2019-06-04 (×4): 1 [IU] via SUBCUTANEOUS

## 2019-05-31 NOTE — Progress Notes (Signed)
Patient noted to have periods of apnea lasting approximately 30 seconds about every 5 minutes while sleeping supine.  Patient sounded obstructive.  Instructed patient to try side-lying.

## 2019-05-31 NOTE — Progress Notes (Signed)
Charge notes 05/31/19  Amanda Murphy&OX4. Currently on 10L HF. Amanda up with one assist to [facility].  Amanda bathed. Amanda repositions self, currently proning self in bed. Blood sugar ACHS WNL.Amanda received two doses of remdesavir so far. Mother updated on condition. Appetite is good.  No major events. H/O childhood asthma.  OSA issues at night - needs sleep study has no prior diagnosis.

## 2019-05-31 NOTE — Progress Notes (Signed)
Pt called mother while RN in the room, all questions answered at this time. Pt sitting on edge of bed at this time, just finished eating. States that she is feeling better.

## 2019-05-31 NOTE — Progress Notes (Signed)
PROGRESS NOTE                                                                                                                                                                                                             Patient Demographics:    Amanda Murphy, is a 38 y.o. female, DOB - 1981/03/28, [id number]  Outpatient Primary MD for the patient is Hoyt Koch, MD   Admit date - 05/29/2019   LOS - 1  Chief Complaint  Patient presents with  . Chest Pain  . Shortness of Breath       Brief Narrative: Patient is a 38 y.o. female with PMHx of history of VTE no longer on anticoagulation, suprasellar mass resection, morbid obesity-who presented to the hospital with complaints of shortness of breath, fatigue and malaise-she was found to have acute hypoxic respiratory failure in setting of COVID-19 pneumonia.   Subjective:    Amanda Murphy today feels short of breath with minimal movement.  She is on 15 L of HFNC.   Assessment  & Plan :   Acute Hypoxic Resp Failure due to Covid 19 Viral pneumonia: Continues to have severe hypoxemia requiring 15 L of HFNC-CRP is significantly elevated.  Continue remdesivir-we will change from Decadron to IV Solu-Medrol.  Spoke at length with patient-regarding rationale/risks/benefits of off label use of Actemra-she has consented to receive a second dose.  The rationale for the off label use of Actemra its known side effects, potential benefits was  discussed with patient .The use of Actemra is based on published clinical articles/anecdotal data as several randomized trials have been negative, but lately there have been a few positive randomized trials as well.  There is no history of tuberculosis, hepatitis B or C.  Complete risks and long-term side effects are unknown, however in the best clinical judgment it is felt that the clinical benefit at this time outweighs medical risks given  tenuous clinical state of the patient.  Patient agrees with the treatment plan and consent to the use of Actemra.   Fever: afebrile  O2 requirements:  SpO2: 95 % O2 Flow Rate (L/min): 15 L/min   COVID-19 Labs: Recent Labs    05/29/19 2216 05/30/19 0656 05/31/19 0310  DDIMER 1.01*  --  1.19*  FERRITIN 309* 358*  --   LDH 512*  --   --  CRP 20.8* 21.7* 20.9*    No results found for: BNP  Recent Labs  Lab 05/29/19 2216  PROCALCITON <0.10    No results found for: SARSCOV2NAA   COVID-19 Medications: Steroids: 12/21>> Remdesivir: 12/21>> Actemra: X1 on 12/22 and on 12/23 Convalescent Plasma: X1 on 12/22  Other medications: Diuretics:Euvolemic-Lasix 40 mg IV x1 to maintain negative balance. Antibiotics:Not needed as no evidence of bacterial infection Insulin: We will start monitoring CBGs on SSI while on steroids.  Check A1c.  Prone/Incentive Spirometry: encouraged patient to lie prone for 3-4 hours at a time for a total of 16 hours a day, and to encourage incentive spirometry use 3-4/hour.  DVT Prophylaxis  :  Lovenox -change to twice daily dosing.  AKI: Resolved.  Transaminitis: Secondary to COVID-19-very mild-follow.    History of venous thromboembolism: Claims it was provoked from long trip, surgery-and birth control.  Maintained on aspirin in the outpatient setting.  Switch to twice daily dosing of Lovenox-follow D-dimer.  Given prior history of VTE-and ongoing severe hypoxemia from Covid-remains at risk for VTE.  Morbid obesity: Estimated body mass index is 60.09 kg/m as calculated from the following:   Height as of this encounter: 5\' 4"  (1.626 m).   Weight as of this encounter: 158.8 kg.   Consults  :  None  Procedures  :  None  ABG:    Component Value Date/Time   PHART 7.440 11/23/2014 1348   PCO2ART 23.2 (L) 11/23/2014 1348   PO2ART 70.0 (L) 11/23/2014 1348   HCO3 15.7 (L) 11/23/2014 1348   TCO2 16 11/23/2014 1348   ACIDBASEDEF 7.0 (H)  11/23/2014 1348   O2SAT 95.0 11/23/2014 1348    Vent Settings: N/A   Condition - Extremely Guarded  Family Communication  : Tried calling sister-unable to leave a Advertising account executive.    Code Status :  Full Code  Diet :  Diet Order            Diet regular Room service appropriate? Yes; Fluid consistency: Thin  Diet effective now               Disposition Plan  :  Remain hospitalized  Barriers to discharge: Hypoxia requiring O2 supplementation/complete 5 days of IV Remdesivir  Antimicorbials  :    Anti-infectives (From admission, onward)   Start     Dose/Rate Route Frequency Ordered Stop   05/31/19 1000  remdesivir 100 mg in sodium chloride 0.9 % 100 mL IVPB     100 mg 200 mL/hr over 30 Minutes Intravenous Daily 05/30/19 0322 06/04/19 0959   05/30/19 0415  remdesivir 200 mg in sodium chloride 0.9% 250 mL IVPB     200 mg 580 mL/hr over 30 Minutes Intravenous Once 05/30/19 0322 05/30/19 0624      Inpatient Medications  Scheduled Meds: . sodium chloride   Intravenous Once  . vitamin C  500 mg Oral Daily  . aspirin EC  325 mg Oral Daily  . cholecalciferol  1,000 Units Oral Daily  . dexamethasone (DECADRON) injection  6 mg Intravenous Daily  . enoxaparin (LOVENOX) injection  80 mg Subcutaneous Q24H  . mouth rinse  15 mL Mouth Rinse BID  . zinc sulfate  220 mg Oral Daily   Continuous Infusions: . remdesivir 100 mg in NS 100 mL 100 mg (05/31/19 1025)   PRN Meds:.acetaminophen **OR** acetaminophen, ondansetron **OR** ondansetron (ZOFRAN) IV   Time Spent in minutes  45  See all Orders from today for further details   Sharday Michl  Khamora Karan M.D on 05/31/2019 at 11:16 AM  To page go to [email or web address] - use universal password  [facility] -  Office  [phone]    Objective:   Vitals:   05/31/19 0000 05/31/19 0400 05/31/19 0600 05/31/19 0735  BP: (!) 128/92 (!) 144/88  (!) 130/93  Pulse: 96 83 (!) 101 (!) 103  Resp: (!) 47 (!) 26 15 (!) 32  Temp: 98.4 F (36.9 C)  98.4 F (36.9 C)  97.6 F (36.4 C)  TempSrc: Oral Oral  Oral  SpO2: (!) 88% (!) 88% (!) 83% 95%  Weight:      Height:        Wt Readings from Last 3 Encounters:  05/30/19 (!) 158.8 kg  04/06/18 (!) 150.1 kg  03/17/18 (!) 151 kg     Intake/Output Summary (Last 24 hours) at 05/31/2019 1116 Last data filed at 05/30/2019 2345 Gross per 24 hour  Intake 820 ml  Output --  Net 820 ml     Physical Exam Gen Exam:Alert awake-not in any distress HEENT:atraumatic, normocephalic Chest: B/L clear to auscultation anteriorly CVS:S1S2 regular Abdomen:soft non tender, non distended Extremities:no edema Neurology: Non focal Skin: no rash   Data Review:    CBC Recent Labs  Lab 05/29/19 2024 05/30/19 0450 05/30/19 0656 05/31/19 0310  WBC 7.2 8.3 8.0 7.0  HGB 14.3 13.8 14.2 13.9  HCT 44.6 43.6 44.4 44.7  PLT 337 326 339 358  MCV 92.9 93.6 93.1 93.9  MCH 29.8 29.6 29.8 29.2  MCHC 32.1 31.7 32.0 31.1  RDW 14.8 14.9 15.0 14.9  LYMPHSABS  --   --  0.9  --   MONOABS  --   --  0.3  --   EOSABS  --   --  0.0  --   BASOSABS  --   --  0.0  --     Chemistries  Recent Labs  Lab 05/29/19 2024 05/29/19 2216 05/30/19 0450 05/30/19 0656 05/31/19 0310  NA 142  --   --  141 137  K 4.0  --   --  4.2 4.0  CL 104  --   --  104 102  CO2 26  --   --  22 27  GLUCOSE 98  --   --  110* 101*  BUN 7  --   --  9 14  CREATININE 1.09*  --  1.03* 0.72 0.72  CALCIUM 8.7*  --   --  8.6* 8.7*  MG  --   --   --   --  2.5*  AST  --  66*  --  75* 77*  ALT  --  75*  --  77* 85*  ALKPHOS  --  49  --  54 55  BILITOT  --  0.9  --  1.4* 0.7   ------------------------------------------------------------------------------------------------------------------ Recent Labs    05/29/19 2145  TRIG 51    Lab Results  Component Value Date   HGBA1C 6.0 (H) 11/16/2017   ------------------------------------------------------------------------------------------------------------------ No results for  input(s): TSH, T4TOTAL, T3FREE, THYROIDAB in the last 72 hours.  Invalid input(s): FREET3 ------------------------------------------------------------------------------------------------------------------ Recent Labs    05/29/19 2216 05/30/19 0656  FERRITIN 309* 358*    Coagulation profile No results for input(s): INR, PROTIME in the last 168 hours.  Recent Labs    05/29/19 2216 05/31/19 0310  DDIMER 1.01* 1.19*    Cardiac Enzymes No results for input(s): CKMB, TROPONINI, MYOGLOBIN in the last 168 hours.  Invalid input(s): CK ------------------------------------------------------------------------------------------------------------------ No results  found for: BNP  Micro Results Recent Results (from the past 240 hour(s))  Blood Culture (routine x 2)     Status: None (Preliminary result)   Collection Time: 05/29/19 10:15 PM   Specimen: BLOOD  Result Value Ref Range Status   Specimen Description BLOOD RIGHT ANTECUBITAL  Final   Special Requests   Final    BOTTLES DRAWN AEROBIC AND ANAEROBIC Blood Culture adequate volume   Culture   Final    NO GROWTH 2 DAYS Performed at [facility], [address] [place], [place] [address]    Report Status PENDING  Incomplete  Blood Culture (routine x 2)     Status: None (Preliminary result)   Collection Time: 05/30/19 12:26 AM   Specimen: BLOOD RIGHT ARM  Result Value Ref Range Status   Specimen Description BLOOD RIGHT ARM  Final   Special Requests   Final    BOTTLES DRAWN AEROBIC AND ANAEROBIC Blood Culture adequate volume   Culture   Final    NO GROWTH 1 DAY Performed at [facility], [place] [address], [place], [place] [address]    Report Status PENDING  Incomplete    Radiology Reports DG Chest 2 View  Result Date: 05/29/2019 CLINICAL DATA:  Shortness of breath, wheezing and productive cough for 1 week, history asthma EXAM: CHEST - 2 VIEW COMPARISON:  11/26/2014 FINDINGS: Mild enlargement of cardiac  silhouette. Mediastinal contours normal. Patchy BILATERAL pulmonary infiltrates most consistent with multifocal pneumonia. No pleural effusion or pneumothorax. Respiratory motion artifacts degrade lateral view. No acute osseous findings. IMPRESSION: Mild enlargement of cardiac silhouette. Patchy BILATERAL airspace infiltrates consistent with multifocal pneumonia, including atypical and viral etiologies. Electronically Signed   By: Lavonia Dana M.D.   On: 05/29/2019 19:21   CT ANGIO CHEST PE W OR WO CONTRAST  Result Date: 05/30/2019 CLINICAL DATA:  Shortness of breath with fever.  COVID-19 positive EXAM: CT ANGIOGRAPHY CHEST WITH CONTRAST TECHNIQUE: Multidetector CT imaging of the chest was performed using the standard protocol during bolus administration of intravenous contrast. Multiplanar CT image reconstructions and MIPs were obtained to evaluate the vascular anatomy. CONTRAST:  75 mL OMNIPAQUE IOHEXOL 350 MG/ML SOLN COMPARISON:  CT angiogram chest November 23, 2014; chest radiograph May 29 2019 FINDINGS: Cardiovascular: There is no demonstrable pulmonary embolus. There is no thoracic aortic aneurysm or dissection. The visualized great vessels appear unremarkable. Note that the right innominate and left common carotid arteries arise as a common trunk, an anatomic variant. There is no pericardial effusion or pericardial thickening. Mediastinum/Nodes: Thyroid appears unremarkable. There is no demonstrable adenopathy. No esophageal lesions are appreciable. Lungs/Pleura: There is widespread airspace opacity throughout the lungs bilaterally involving all lobes in segments to varying degrees. There are scattered areas of mild consolidation; the bulk of the opacity bilaterally is more of a ground-glass type appearance. No pleural effusions are evident. Note that consolidation is most notable in the medial lower lobe regions bilaterally. Upper Abdomen: There is hepatic steatosis. Visualized upper abdominal  structures appear unremarkable. Musculoskeletal: There are no blastic or lytic bone lesions. No chest wall lesions are evident. Review of the MIP images confirms the above findings. IMPRESSION: 1. No demonstrable pulmonary embolus. No thoracic aortic aneurysm or dissection. 2.  Widespread multifocal pneumonia.  No pleural effusions. 3.  No evident adenopathy. 4.  Hepatic steatosis. Electronically Signed   By: Lowella Grip III M.D.   On: 05/30/2019 07:21

## 2019-05-31 NOTE — Progress Notes (Signed)
Spoke with patient's mother and gave update on patient.  Answered all questions and told mother to call back if she would like to at any time.

## 2019-06-01 LAB — COMPREHENSIVE METABOLIC PANEL
ALT: 99 U/L — ABNORMAL HIGH (ref 0–44)
AST: 85 U/L — ABNORMAL HIGH (ref 15–41)
Albumin: 2.9 g/dL — ABNORMAL LOW (ref 3.5–5.0)
Alkaline Phosphatase: 58 U/L (ref 38–126)
Anion gap: 11 (ref 5–15)
BUN: 17 mg/dL (ref 6–20)
CO2: 28 mmol/L (ref 22–32)
Calcium: 8.9 mg/dL (ref 8.9–10.3)
Chloride: 104 mmol/L (ref 98–111)
Creatinine, Ser: 0.81 mg/dL (ref 0.44–1.00)
GFR calc Af Amer: >60 mL/min (ref >60)
GFR calc non Af Amer: >60 mL/min (ref >60)
Glucose, Bld: 114 mg/dL — ABNORMAL HIGH (ref 70–99)
Potassium: 4.5 mmol/L (ref 3.5–5.1)
Sodium: 143 mmol/L (ref 135–145)
Total Bilirubin: 0.6 mg/dL (ref 0.3–1.2)
Total Protein: 7.8 g/dL (ref 6.5–8.1)

## 2019-06-01 LAB — C-REACTIVE PROTEIN: CRP: 10.8 mg/dL — ABNORMAL HIGH (ref <1.0)

## 2019-06-01 LAB — CBC
HCT: 46.4 % — ABNORMAL HIGH (ref 36.0–46.0)
Hemoglobin: 14.6 g/dL (ref 12.0–15.0)
MCH: 29.6 pg (ref 26.0–34.0)
MCHC: 31.5 g/dL (ref 30.0–36.0)
MCV: 93.9 fL (ref 80.0–100.0)
Platelets: 399 10*3/uL (ref 150–400)
RBC: 4.94 MIL/uL (ref 3.87–5.11)
RDW: 14.9 % (ref 11.5–15.5)
WBC: 11.7 10*3/uL — ABNORMAL HIGH (ref 4.0–10.5)
nRBC: 0.3 % — ABNORMAL HIGH (ref 0.0–0.2)

## 2019-06-01 LAB — D-DIMER, QUANTITATIVE: D-Dimer, Quant: 0.99 ug/mL-FEU — ABNORMAL HIGH (ref 0.00–0.50)

## 2019-06-01 LAB — MAGNESIUM: Magnesium: 2.5 mg/dL — ABNORMAL HIGH (ref 1.7–2.4)

## 2019-06-01 LAB — FERRITIN: Ferritin: 432 ng/mL — ABNORMAL HIGH (ref 11–307)

## 2019-06-01 LAB — PHOSPHORUS: Phosphorus: 3.5 mg/dL (ref 2.5–4.6)

## 2019-06-01 LAB — HEMOGLOBIN A1C
Hgb A1c MFr Bld: 6.4 % — ABNORMAL HIGH (ref 4.8–5.6)
Mean Plasma Glucose: 136.98 mg/dL

## 2019-06-01 LAB — GLUCOSE, CAPILLARY
Glucose-Capillary: 117 mg/dL — ABNORMAL HIGH (ref 70–99)
Glucose-Capillary: 114 mg/dL — ABNORMAL HIGH (ref 70–99)
Glucose-Capillary: 120 mg/dL — ABNORMAL HIGH (ref 70–99)

## 2019-06-01 MED ORDER — FUROSEMIDE 10 MG/ML IJ SOLN
40.0000 mg | Freq: Once | INTRAMUSCULAR | Status: AC
  Administered 2019-06-01: 40 mg via INTRAVENOUS
  Filled 2019-06-01: qty 4

## 2019-06-01 NOTE — Plan of Care (Signed)
Pt aware of condition and measures needed to improve respiratory status including cough, deep breathing, IS, and proning. Pt spoke with mother and has good support system in place.

## 2019-06-01 NOTE — Progress Notes (Signed)
PROGRESS NOTE                                                                                                                                                                                                             Patient Demographics:    Amanda Murphy, is a 38 y.o. female, DOB - 1981-02-27, [id number]  Outpatient Primary MD for the patient is Hoyt Koch, MD   Admit date - 05/29/2019   LOS - 2  Chief Complaint  Patient presents with  . Chest Pain  . Shortness of Breath       Brief Narrative: Patient is a 38 y.o. female with PMHx of history of VTE no longer on anticoagulation, suprasellar mass resection, morbid obesity-who presented to the hospital with complaints of shortness of breath, fatigue and malaise-she was found to have acute hypoxic respiratory failure in setting of COVID-19 pneumonia.   Subjective:    Amanda Murphy today feels better but she is still on 15 L of HFNC.  She apparently slept last night well-after a long time.   Assessment  & Plan :   Acute Hypoxic Resp Failure due to Covid 19 Viral pneumonia: Severe hypoxemia continues-she is still on 15 L of HFNC.  Continue Solu-Medrol and remdesivir.  She is s/p Actemra x2.  Fever: afebrile  O2 requirements:  SpO2: 92 % O2 Flow Rate (L/min): 15 L/min FiO2 (%): 100 %   COVID-19 Labs: Recent Labs    05/29/19 2216 05/30/19 0656 05/31/19 0310 06/01/19 0305  DDIMER 1.01*  --  1.19* 0.99*  FERRITIN 309* 358*  --  432*  LDH 512*  --   --   --   CRP 20.8* 21.7* 20.9* 10.8*    No results found for: BNP  Recent Labs  Lab 05/29/19 2216  PROCALCITON <0.10    No results found for: SARSCOV2NAA   COVID-19 Medications: Steroids: 12/21>> Remdesivir: 12/21>> Actemra: X1 on 12/22 and on 12/23 Convalescent Plasma: X1 on 12/22  Other medications: Diuretics:Euvolemic-repeat Lasix 40 mg IV x1 to maintain negative  balance. Antibiotics:Not needed as no evidence of bacterial infection Insulin: CBG stable on SSI while on steroids.  A1c 6.4-needs diet/lifestyle modification on discharge. .  Prone/Incentive Spirometry: encouraged patient to lie prone for 3-4 hours at a time for a total of 16 hours a day, and to encourage  incentive spirometry use 3-4/hour.  DVT Prophylaxis  :  Lovenox -change to twice daily dosing.  AKI: Resolved.  Transaminitis: Secondary to COVID-19-very mild-follow.  History of venous thromboembolism: Claims it was provoked from long trip, surgery-and birth control.  Maintained on aspirin in the outpatient setting.  Continue twice daily dosing of Lovenox-follow D-dimer.  Given prior history of VTE-and ongoing severe hypoxemia from Covid-remains at risk for VTE.  Morbid obesity: Estimated body mass index is 60.09 kg/m as calculated from the following:   Height as of this encounter: 5\' 4"  (1.626 m).   Weight as of this encounter: 158.8 kg.   Consults  :  None  Procedures  :  None  ABG:    Component Value Date/Time   PHART 7.440 11/23/2014 1348   PCO2ART 23.2 (L) 11/23/2014 1348   PO2ART 70.0 (L) 11/23/2014 1348   HCO3 15.7 (L) 11/23/2014 1348   TCO2 16 11/23/2014 1348   ACIDBASEDEF 7.0 (H) 11/23/2014 1348   O2SAT 95.0 11/23/2014 1348    Vent Settings: N/A FiO2 (%):  [100 %] 100 %   Condition - Extremely Guarded  Family Communication  : Spoke with patient's sister over the phone-explained tenuous but stable clinical situation-she is aware of the seriousness of the situation.  Code Status :  Full Code  Diet :  Diet Order            Diet regular Room service appropriate? Yes; Fluid consistency: Thin  Diet effective now               Disposition Plan  :  Remain hospitalized  Barriers to discharge: Hypoxia requiring O2 supplementation/complete 5 days of IV Remdesivir  Antimicorbials  :    Anti-infectives (From admission, onward)   Start     Dose/Rate Route  Frequency Ordered Stop   05/31/19 1000  remdesivir 100 mg in sodium chloride 0.9 % 100 mL IVPB     100 mg 200 mL/hr over 30 Minutes Intravenous Daily 05/30/19 0322 06/04/19 0959   05/30/19 0415  remdesivir 200 mg in sodium chloride 0.9% 250 mL IVPB     200 mg 580 mL/hr over 30 Minutes Intravenous Once 05/30/19 0322 05/30/19 0624      Inpatient Medications  Scheduled Meds: . sodium chloride   Intravenous Once  . vitamin C  500 mg Oral Daily  . cholecalciferol  1,000 Units Oral Daily  . enoxaparin (LOVENOX) injection  80 mg Subcutaneous Q12H  . famotidine  20 mg Oral Daily  . insulin aspart  0-9 Units Subcutaneous TID WC  . mouth rinse  15 mL Mouth Rinse BID  . methylPREDNISolone (SOLU-MEDROL) injection  60 mg Intravenous Q12H  . zinc sulfate  220 mg Oral Daily   Continuous Infusions: . remdesivir 100 mg in NS 100 mL 100 mg (06/01/19 1027)   PRN Meds:.acetaminophen **OR** acetaminophen, ondansetron **OR** ondansetron (ZOFRAN) IV   Time Spent in minutes  45  See all Orders from today for further details   Oren Binet M.D on 06/01/2019 at 3:04 PM  To page go to [email or web address] - use universal password  [facility] -  Office  [phone]    Objective:   Vitals:   06/01/19 0800 06/01/19 0943 06/01/19 1300 06/01/19 1400  BP: 109/79 109/79    Pulse: 95  (!) 110 (!) 109  Resp: (!) 31 (!) 27  (!) 30  Temp: 98.7 F (37.1 C)     TempSrc: Axillary     SpO2: 92% 97% [name])  82% 92%  Weight:      Height:        Wt Readings from Last 3 Encounters:  05/30/19 (!) 158.8 kg  04/06/18 (!) 150.1 kg  03/17/18 (!) 151 kg     Intake/Output Summary (Last 24 hours) at 06/01/2019 1504 Last data filed at 06/01/2019 0600 Gross per 24 hour  Intake 551.78 ml  Output 1750 ml  Net -1198.22 ml     Physical Exam Gen Exam:Alert awake-not in any distress HEENT:atraumatic, normocephalic Chest: B/L clear to auscultation anteriorly CVS:S1S2 regular Abdomen:soft non tender,  non distended Extremities:no edema Neurology: Non focal Skin: no rash  Data Review:    CBC Recent Labs  Lab 05/29/19 2024 05/30/19 0450 05/30/19 0656 05/31/19 0310 06/01/19 0305  WBC 7.2 8.3 8.0 7.0 11.7*  HGB 14.3 13.8 14.2 13.9 14.6  HCT 44.6 43.6 44.4 44.7 46.4*  PLT 337 326 339 358 399  MCV 92.9 93.6 93.1 93.9 93.9  MCH 29.8 29.6 29.8 29.2 29.6  MCHC 32.1 31.7 32.0 31.1 31.5  RDW 14.8 14.9 15.0 14.9 14.9  LYMPHSABS  --   --  0.9  --   --   MONOABS  --   --  0.3  --   --   EOSABS  --   --  0.0  --   --   BASOSABS  --   --  0.0  --   --     Chemistries  Recent Labs  Lab 05/29/19 2024 05/29/19 2216 05/30/19 0450 05/30/19 0656 05/31/19 0310 06/01/19 0305  NA 142  --   --  141 137 143  K 4.0  --   --  4.2 4.0 4.5  CL 104  --   --  104 102 104  CO2 26  --   --  22 27 28   GLUCOSE 98  --   --  110* 101* 114*  BUN 7  --   --  9 14 17   CREATININE 1.09*  --  1.03* 0.72 0.72 0.81  CALCIUM 8.7*  --   --  8.6* 8.7* 8.9  MG  --   --   --   --  2.5* 2.5*  AST  --  66*  --  75* 77* 85*  ALT  --  75*  --  77* 85* 99*  ALKPHOS  --  49  --  54 55 58  BILITOT  --  0.9  --  1.4* 0.7 0.6   ------------------------------------------------------------------------------------------------------------------ Recent Labs    05/29/19 2145  TRIG 51    Lab Results  Component Value Date   HGBA1C 6.4 (H) 06/01/2019   ------------------------------------------------------------------------------------------------------------------ No results for input(s): TSH, T4TOTAL, T3FREE, THYROIDAB in the last 72 hours.  Invalid input(s): FREET3 ------------------------------------------------------------------------------------------------------------------ Recent Labs    05/30/19 0656 06/01/19 0305  FERRITIN 358* 432*    Coagulation profile No results for input(s): INR, PROTIME in the last 168 hours.  Recent Labs    05/31/19 0310 06/01/19 0305  DDIMER 1.19* 0.99*    Cardiac  Enzymes No results for input(s): CKMB, TROPONINI, MYOGLOBIN in the last 168 hours.  Invalid input(s): CK ------------------------------------------------------------------------------------------------------------------ No results found for: BNP  Micro Results Recent Results (from the past 240 hour(s))  Blood Culture (routine x 2)     Status: None (Preliminary result)   Collection Time: 05/29/19 10:15 PM   Specimen: BLOOD  Result Value Ref Range Status   Specimen Description BLOOD RIGHT ANTECUBITAL  Final   Special Requests  Final    BOTTLES DRAWN AEROBIC AND ANAEROBIC Blood Culture adequate volume   Culture   Final    NO GROWTH 3 DAYS Performed at [facility], [place] [address], [place], [place] [address]    Report Status PENDING  Incomplete  Blood Culture (routine x 2)     Status: None (Preliminary result)   Collection Time: 05/30/19 12:26 AM   Specimen: BLOOD RIGHT ARM  Result Value Ref Range Status   Specimen Description BLOOD RIGHT ARM  Final   Special Requests   Final    BOTTLES DRAWN AEROBIC AND ANAEROBIC Blood Culture adequate volume   Culture   Final    NO GROWTH 2 DAYS Performed at [facility], [place] [address] [place], [place] [address]    Report Status PENDING  Incomplete    Radiology Reports DG Chest 2 View  Result Date: 05/29/2019 CLINICAL DATA:  Shortness of breath, wheezing and productive cough for 1 week, history asthma EXAM: CHEST - 2 VIEW COMPARISON:  11/26/2014 FINDINGS: Mild enlargement of cardiac silhouette. Mediastinal contours normal. Patchy BILATERAL pulmonary infiltrates most consistent with multifocal pneumonia. No pleural effusion or pneumothorax. Respiratory motion artifacts degrade lateral view. No acute osseous findings. IMPRESSION: Mild enlargement of cardiac silhouette. Patchy BILATERAL airspace infiltrates consistent with multifocal pneumonia, including atypical and viral etiologies. Electronically Signed   By: Lavonia Dana M.D.   On:  05/29/2019 19:21   CT ANGIO CHEST PE W OR WO CONTRAST  Result Date: 05/30/2019 CLINICAL DATA:  Shortness of breath with fever.  COVID-19 positive EXAM: CT ANGIOGRAPHY CHEST WITH CONTRAST TECHNIQUE: Multidetector CT imaging of the chest was performed using the standard protocol during bolus administration of intravenous contrast. Multiplanar CT image reconstructions and MIPs were obtained to evaluate the vascular anatomy. CONTRAST:  75 mL OMNIPAQUE IOHEXOL 350 MG/ML SOLN COMPARISON:  CT angiogram chest November 23, 2014; chest radiograph May 29 2019 FINDINGS: Cardiovascular: There is no demonstrable pulmonary embolus. There is no thoracic aortic aneurysm or dissection. The visualized great vessels appear unremarkable. Note that the right innominate and left common carotid arteries arise as a common trunk, an anatomic variant. There is no pericardial effusion or pericardial thickening. Mediastinum/Nodes: Thyroid appears unremarkable. There is no demonstrable adenopathy. No esophageal lesions are appreciable. Lungs/Pleura: There is widespread airspace opacity throughout the lungs bilaterally involving all lobes in segments to varying degrees. There are scattered areas of mild consolidation; the bulk of the opacity bilaterally is more of a ground-glass type appearance. No pleural effusions are evident. Note that consolidation is most notable in the medial lower lobe regions bilaterally. Upper Abdomen: There is hepatic steatosis. Visualized upper abdominal structures appear unremarkable. Musculoskeletal: There are no blastic or lytic bone lesions. No chest wall lesions are evident. Review of the MIP images confirms the above findings. IMPRESSION: 1. No demonstrable pulmonary embolus. No thoracic aortic aneurysm or dissection. 2.  Widespread multifocal pneumonia.  No pleural effusions. 3.  No evident adenopathy. 4.  Hepatic steatosis. Electronically Signed   By: Lowella Grip III M.D.   On: 05/30/2019 07:21

## 2019-06-02 LAB — CBC
HCT: 50.5 % — ABNORMAL HIGH (ref 36.0–46.0)
Hemoglobin: 15.4 g/dL — ABNORMAL HIGH (ref 12.0–15.0)
MCH: 29 pg (ref 26.0–34.0)
MCHC: 30.5 g/dL (ref 30.0–36.0)
MCV: 95.1 fL (ref 80.0–100.0)
Platelets: 476 10*3/uL — ABNORMAL HIGH (ref 150–400)
RBC: 5.31 MIL/uL — ABNORMAL HIGH (ref 3.87–5.11)
RDW: 15.1 % (ref 11.5–15.5)
WBC: 20.2 10*3/uL — ABNORMAL HIGH (ref 4.0–10.5)
nRBC: 0.6 % — ABNORMAL HIGH (ref 0.0–0.2)

## 2019-06-02 LAB — COMPREHENSIVE METABOLIC PANEL
ALT: 263 U/L — ABNORMAL HIGH (ref 0–44)
AST: 211 U/L — ABNORMAL HIGH (ref 15–41)
Albumin: 3.3 g/dL — ABNORMAL LOW (ref 3.5–5.0)
Alkaline Phosphatase: 67 U/L (ref 38–126)
Anion gap: 14 (ref 5–15)
BUN: 20 mg/dL (ref 6–20)
CO2: 29 mmol/L (ref 22–32)
Calcium: 9.2 mg/dL (ref 8.9–10.3)
Chloride: 100 mmol/L (ref 98–111)
Creatinine, Ser: 0.97 mg/dL (ref 0.44–1.00)
GFR calc Af Amer: >60 mL/min (ref >60)
GFR calc non Af Amer: >60 mL/min (ref >60)
Glucose, Bld: 114 mg/dL — ABNORMAL HIGH (ref 70–99)
Potassium: 4.3 mmol/L (ref 3.5–5.1)
Sodium: 143 mmol/L (ref 135–145)
Total Bilirubin: 0.6 mg/dL (ref 0.3–1.2)
Total Protein: 8.4 g/dL — ABNORMAL HIGH (ref 6.5–8.1)

## 2019-06-02 LAB — GLUCOSE, CAPILLARY
Glucose-Capillary: 121 mg/dL — ABNORMAL HIGH (ref 70–99)
Glucose-Capillary: 136 mg/dL — ABNORMAL HIGH (ref 70–99)
Glucose-Capillary: 114 mg/dL — ABNORMAL HIGH (ref 70–99)

## 2019-06-02 LAB — MAGNESIUM: Magnesium: 2.7 mg/dL — ABNORMAL HIGH (ref 1.7–2.4)

## 2019-06-02 LAB — C-REACTIVE PROTEIN: CRP: 5.9 mg/dL — ABNORMAL HIGH (ref <1.0)

## 2019-06-02 LAB — FERRITIN: Ferritin: 576 ng/mL — ABNORMAL HIGH (ref 11–307)

## 2019-06-02 LAB — PHOSPHORUS: Phosphorus: 4 mg/dL (ref 2.5–4.6)

## 2019-06-02 LAB — D-DIMER, QUANTITATIVE: D-Dimer, Quant: 0.79 ug/mL-FEU — ABNORMAL HIGH (ref 0.00–0.50)

## 2019-06-02 NOTE — Progress Notes (Signed)
PROGRESS NOTE                                                                                                                                                                                                             Patient Demographics:    Amanda Murphy, is a 38 y.o. female, DOB - [birth year], [id number]  Outpatient Primary MD for the patient is Hoyt Koch, MD   Admit date - 05/29/2019   LOS - 3  Chief Complaint  Patient presents with  . Chest Pain  . Shortness of Breath       Brief Narrative: Patient is a 38 y.o. female with PMHx of history of VTE no longer on anticoagulation, suprasellar mass resection, morbid obesity-who presented to the hospital with complaints of shortness of breath, fatigue and malaise-she was found to have acute hypoxic respiratory failure in setting of COVID-19 pneumonia.   Subjective:    Amanda Murphy seems to be slowly improving-she was titrated down to 8-10 L of oxygen early this morning.   Assessment  & Plan :   Acute Hypoxic Resp Failure due to Covid 19 Viral pneumonia: Hypoxemia slowly improving-titrated down to 8-10 L.  Continue Solu-Medrol and remdesivir.    Fever: afebrile  O2 requirements:  SpO2: 92 % O2 Flow Rate (L/min): 10 L/min FiO2 (%): 100 %   COVID-19 Labs: Recent Labs    05/31/19 0310 06/01/19 0305 06/02/19 0530  DDIMER 1.19* 0.99* 0.79*  FERRITIN  --  432* 576*  CRP 20.9* 10.8* 5.9*    No results found for: BNP  Recent Labs  Lab 05/29/19 2216  PROCALCITON <0.10    No results found for: SARSCOV2NAA   COVID-19 Medications: Steroids: 12/21>> Remdesivir: 12/21>> Actemra: X1 on 12/22 and on 12/23 Convalescent Plasma: X1 on 12/22  Other medications: Diuretics:Euvolemic-repeat Lasix 40 mg IV x1 to maintain negative balance. Antibiotics:Not needed as no evidence of bacterial infection Insulin: CBG stable on SSI while on steroids.   A1c 6.4-needs diet/lifestyle modification on discharge. . Prone/Incentive Spirometry: encouraged patient to lie prone for 3-4 hours at a time for a total of 16 hours a day, and to encourage incentive spirometry use 3-4/hour.  DVT Prophylaxis  :  Lovenox -twice daily dosing  Leukocytosis: Likely secondary to Solu-Medrol-no clinical indication of superimposed bacterial infection as clinical situation is overall improved.  AKI: Resolved.  Transaminitis: Secondary to COVID-19-LFTs are significantly worse today-spoke with pharmacy team-recommendations are that we continue with remdesivir-latest guidelines are to continue unless ALT more than 440.  Repeat LFTs tomorrow.  History of venous thromboembolism: Claims it was provoked from long trip, surgery-and birth control.  Maintained on aspirin in the outpatient setting.  Continue twice daily dosing of Lovenox-follow D-dimer.  Given prior history of VTE-and ongoing severe hypoxemia from Covid-remains at risk for VTE.  Morbid obesity: Estimated body mass index is 60.09 kg/m as calculated from the following:   Height as of this encounter: 5\' 4"  (1.626 m).   Weight as of this encounter: 158.8 kg.   Consults  :  None  Procedures  :  None  ABG:    Component Value Date/Time   PHART 7.440 11/23/2014 1348   PCO2ART 23.2 (L) 11/23/2014 1348   PO2ART 70.0 (L) 11/23/2014 1348   HCO3 15.7 (L) 11/23/2014 1348   TCO2 16 11/23/2014 1348   ACIDBASEDEF 7.0 (H) 11/23/2014 1348   O2SAT 95.0 11/23/2014 1348    Vent Settings: N/A FiO2 (%):  [100 %] 100 %   Condition - Extremely Guarded  Family Communication  : Left a voicemail for patient's sister  Code Status :  Full Code  Diet :  Diet Order            Diet regular Room service appropriate? Yes; Fluid consistency: Thin  Diet effective now               Disposition Plan  :  Remain hospitalized  Barriers to discharge: Hypoxia requiring O2 supplementation/complete 5 days of IV  Remdesivir  Antimicorbials  :    Anti-infectives (From admission, onward)   Start     Dose/Rate Route Frequency Ordered Stop   05/31/19 1000  remdesivir 100 mg in sodium chloride 0.9 % 100 mL IVPB     100 mg 200 mL/hr over 30 Minutes Intravenous Daily 05/30/19 0322 06/04/19 0959   05/30/19 0415  remdesivir 200 mg in sodium chloride 0.9% 250 mL IVPB     200 mg 580 mL/hr over 30 Minutes Intravenous Once 05/30/19 0322 05/30/19 0624      Inpatient Medications  Scheduled Meds: . sodium chloride   Intravenous Once  . vitamin C  500 mg Oral Daily  . cholecalciferol  1,000 Units Oral Daily  . enoxaparin (LOVENOX) injection  80 mg Subcutaneous Q12H  . famotidine  20 mg Oral Daily  . insulin aspart  0-9 Units Subcutaneous TID WC  . mouth rinse  15 mL Mouth Rinse BID  . methylPREDNISolone (SOLU-MEDROL) injection  60 mg Intravenous Q12H  . zinc sulfate  220 mg Oral Daily   Continuous Infusions: . remdesivir 100 mg in NS 100 mL Stopped (06/02/19 0942)   PRN Meds:.acetaminophen **OR** acetaminophen, ondansetron **OR** ondansetron (ZOFRAN) IV   Time Spent in minutes  45  See all Orders from today for further details   Oren Binet M.D on 06/02/2019 at 2:43 PM  To page go to [email or web address] - use universal password  [facility] -  Office  [phone]    Objective:   Vitals:   06/02/19 0500 06/02/19 0600 06/02/19 0745 06/02/19 1255  BP:   (!) 119/96 137/84  Pulse: 100 89 97 93  Resp: (!) 33 (!) 29 (!) 27 (!) 26  Temp:   98.3 F (36.8 C) 97.7 F (36.5 C)  TempSrc:   Oral Oral  SpO2: 95% (!) 89% 94% 92%  Weight:  Height:        Wt Readings from Last 3 Encounters:  05/30/19 (!) 158.8 kg  04/06/18 (!) 150.1 kg  03/17/18 (!) 151 kg     Intake/Output Summary (Last 24 hours) at 06/02/2019 1443 Last data filed at 06/02/2019 1327 Gross per 24 hour  Intake 960 ml  Output 1900 ml  Net -940 ml     Physical Exam Gen Exam:Alert awake-not in any  distress HEENT:atraumatic, normocephalic Chest: B/L clear to auscultation anteriorly CVS:S1S2 regular Abdomen:soft non tender, non distended Extremities:no edema Neurology: Non focal Skin: no rash   Data Review:    CBC Recent Labs  Lab 05/30/19 0450 05/30/19 0656 05/31/19 0310 06/01/19 0305 06/02/19 0530  WBC 8.3 8.0 7.0 11.7* 20.2*  HGB 13.8 14.2 13.9 14.6 15.4*  HCT 43.6 44.4 44.7 46.4* 50.5*  PLT 326 339 358 399 476*  MCV 93.6 93.1 93.9 93.9 95.1  MCH 29.6 29.8 29.2 29.6 29.0  MCHC 31.7 32.0 31.1 31.5 30.5  RDW 14.9 15.0 14.9 14.9 15.1  LYMPHSABS  --  0.9  --   --   --   MONOABS  --  0.3  --   --   --   EOSABS  --  0.0  --   --   --   BASOSABS  --  0.0  --   --   --     Chemistries  Recent Labs  Lab 05/29/19 2024 05/29/19 2216 05/30/19 0450 05/30/19 0656 05/31/19 0310 06/01/19 0305 06/02/19 0530  NA 142  --   --  141 137 143 143  K 4.0  --   --  4.2 4.0 4.5 4.3  CL 104  --   --  104 102 104 100  CO2 26  --   --  22 27 28 29   GLUCOSE 98  --   --  110* 101* 114* 114*  BUN 7  --   --  9 14 17 20   CREATININE 1.09*  --  1.03* 0.72 0.72 0.81 0.97  CALCIUM 8.7*  --   --  8.6* 8.7* 8.9 9.2  MG  --   --   --   --  2.5* 2.5* 2.7*  AST  --  66*  --  75* 77* 85* 211*  ALT  --  75*  --  77* 85* 99* 263*  ALKPHOS  --  49  --  54 55 58 67  BILITOT  --  0.9  --  1.4* 0.7 0.6 0.6   ------------------------------------------------------------------------------------------------------------------ No results for input(s): CHOL, HDL, LDLCALC, TRIG, CHOLHDL, LDLDIRECT in the last 72 hours.  Lab Results  Component Value Date   HGBA1C 6.4 (H) 06/01/2019   ------------------------------------------------------------------------------------------------------------------ No results for input(s): TSH, T4TOTAL, T3FREE, THYROIDAB in the last 72 hours.  Invalid input(s):  FREET3 ------------------------------------------------------------------------------------------------------------------ Recent Labs    06/01/19 0305 06/02/19 0530  FERRITIN 432* 576*    Coagulation profile No results for input(s): INR, PROTIME in the last 168 hours.  Recent Labs    06/01/19 0305 06/02/19 0530  DDIMER 0.99* 0.79*    Cardiac Enzymes No results for input(s): CKMB, TROPONINI, MYOGLOBIN in the last 168 hours.  Invalid input(s): CK ------------------------------------------------------------------------------------------------------------------ No results found for: BNP  Micro Results Recent Results (from the past 240 hour(s))  Blood Culture (routine x 2)     Status: None (Preliminary result)   Collection Time: 05/29/19 10:15 PM   Specimen: BLOOD  Result Value Ref Range Status   Specimen Description BLOOD  RIGHT ANTECUBITAL  Final   Special Requests   Final    BOTTLES DRAWN AEROBIC AND ANAEROBIC Blood Culture adequate volume   Culture   Final    NO GROWTH 4 DAYS Performed at [facility], [address] [place], [place] [address]    Report Status PENDING  Incomplete  Blood Culture (routine x 2)     Status: None (Preliminary result)   Collection Time: 05/30/19 12:26 AM   Specimen: BLOOD RIGHT ARM  Result Value Ref Range Status   Specimen Description BLOOD RIGHT ARM  Final   Special Requests   Final    BOTTLES DRAWN AEROBIC AND ANAEROBIC Blood Culture adequate volume   Culture   Final    NO GROWTH 3 DAYS Performed at [facility], [place] [address], [place], [place] [address]    Report Status PENDING  Incomplete    Radiology Reports DG Chest 2 View  Result Date: 05/29/2019 CLINICAL DATA:  Shortness of breath, wheezing and productive cough for 1 week, history asthma EXAM: CHEST - 2 VIEW COMPARISON:  11/26/2014 FINDINGS: Mild enlargement of cardiac silhouette. Mediastinal contours normal. Patchy BILATERAL pulmonary infiltrates most  consistent with multifocal pneumonia. No pleural effusion or pneumothorax. Respiratory motion artifacts degrade lateral view. No acute osseous findings. IMPRESSION: Mild enlargement of cardiac silhouette. Patchy BILATERAL airspace infiltrates consistent with multifocal pneumonia, including atypical and viral etiologies. Electronically Signed   By: Lavonia Dana M.D.   On: 05/29/2019 19:21   CT ANGIO CHEST PE W OR WO CONTRAST  Result Date: 05/30/2019 CLINICAL DATA:  Shortness of breath with fever.  COVID-19 positive EXAM: CT ANGIOGRAPHY CHEST WITH CONTRAST TECHNIQUE: Multidetector CT imaging of the chest was performed using the standard protocol during bolus administration of intravenous contrast. Multiplanar CT image reconstructions and MIPs were obtained to evaluate the vascular anatomy. CONTRAST:  75 mL OMNIPAQUE IOHEXOL 350 MG/ML SOLN COMPARISON:  CT angiogram chest November 23, 2014; chest radiograph May 29 2019 FINDINGS: Cardiovascular: There is no demonstrable pulmonary embolus. There is no thoracic aortic aneurysm or dissection. The visualized great vessels appear unremarkable. Note that the right innominate and left common carotid arteries arise as a common trunk, an anatomic variant. There is no pericardial effusion or pericardial thickening. Mediastinum/Nodes: Thyroid appears unremarkable. There is no demonstrable adenopathy. No esophageal lesions are appreciable. Lungs/Pleura: There is widespread airspace opacity throughout the lungs bilaterally involving all lobes in segments to varying degrees. There are scattered areas of mild consolidation; the bulk of the opacity bilaterally is more of a ground-glass type appearance. No pleural effusions are evident. Note that consolidation is most notable in the medial lower lobe regions bilaterally. Upper Abdomen: There is hepatic steatosis. Visualized upper abdominal structures appear unremarkable. Musculoskeletal: There are no blastic or lytic bone lesions.  No chest wall lesions are evident. Review of the MIP images confirms the above findings. IMPRESSION: 1. No demonstrable pulmonary embolus. No thoracic aortic aneurysm or dissection. 2.  Widespread multifocal pneumonia.  No pleural effusions. 3.  No evident adenopathy. 4.  Hepatic steatosis. Electronically Signed   By: Lowella Grip III M.D.   On: 05/30/2019 07:21

## 2019-06-02 NOTE — Plan of Care (Signed)
  Problem: Education: Goal: Knowledge of risk factors and measures for prevention of condition will improve Outcome: Progressing   Problem: Coping: Goal: Psychosocial and spiritual needs will be supported Outcome: Progressing   Problem: Respiratory: Goal: Will maintain a patent airway Outcome: Progressing Goal: Complications related to the disease process, condition or treatment will be avoided or minimized Outcome: Progressing   Problem: Education: Goal: Knowledge of General Education information will improve Description: Including pain rating scale, medication(s)/side effects and non-pharmacologic comfort measures Outcome: Progressing   Problem: Clinical Measurements: Goal: Respiratory complications will improve Outcome: Progressing   Problem: Activity: Goal: Risk for activity intolerance will decrease Outcome: Progressing   Problem: Skin Integrity: Goal: Risk for impaired skin integrity will decrease Outcome: Progressing

## 2019-06-02 NOTE — Progress Notes (Signed)
Patient has remained free from falls and injuries; skin precautions maintained; VSS overnight; patient afebrile overnight. Patient able to be weaned down to [facility] while awake; however when asleep she requires 15L HFNC with intermittent NRB for sats in the low-mid 80's. Patient is safe with bed locked in low position. Will continue to assist toward discharge goals.

## 2019-06-03 ENCOUNTER — Encounter (HOSPITAL_COMMUNITY): Payer: Self-pay | Admitting: Internal Medicine

## 2019-06-03 LAB — COMPREHENSIVE METABOLIC PANEL
ALT: 229 U/L — ABNORMAL HIGH (ref 0–44)
AST: 98 U/L — ABNORMAL HIGH (ref 15–41)
Albumin: 3 g/dL — ABNORMAL LOW (ref 3.5–5.0)
Alkaline Phosphatase: 59 U/L (ref 38–126)
Anion gap: 11 (ref 5–15)
BUN: 18 mg/dL (ref 6–20)
CO2: 29 mmol/L (ref 22–32)
Calcium: 8.7 mg/dL — ABNORMAL LOW (ref 8.9–10.3)
Chloride: 102 mmol/L (ref 98–111)
Creatinine, Ser: 0.75 mg/dL (ref 0.44–1.00)
GFR calc Af Amer: >60 mL/min (ref >60)
GFR calc non Af Amer: >60 mL/min (ref >60)
Glucose, Bld: 121 mg/dL — ABNORMAL HIGH (ref 70–99)
Potassium: 4.3 mmol/L (ref 3.5–5.1)
Sodium: 142 mmol/L (ref 135–145)
Total Bilirubin: 0.8 mg/dL (ref 0.3–1.2)
Total Protein: 7.4 g/dL (ref 6.5–8.1)

## 2019-06-03 LAB — C-REACTIVE PROTEIN: CRP: 2.8 mg/dL — ABNORMAL HIGH (ref <1.0)

## 2019-06-03 LAB — GLUCOSE, CAPILLARY
Glucose-Capillary: 120 mg/dL — ABNORMAL HIGH (ref 70–99)
Glucose-Capillary: 115 mg/dL — ABNORMAL HIGH (ref 70–99)
Glucose-Capillary: 194 mg/dL — ABNORMAL HIGH (ref 70–99)
Glucose-Capillary: 171 mg/dL — ABNORMAL HIGH (ref 70–99)

## 2019-06-03 LAB — CBC
HCT: 46.6 % — ABNORMAL HIGH (ref 36.0–46.0)
Hemoglobin: 14.6 g/dL (ref 12.0–15.0)
MCH: 29.3 pg (ref 26.0–34.0)
MCHC: 31.3 g/dL (ref 30.0–36.0)
MCV: 93.6 fL (ref 80.0–100.0)
Platelets: 529 10*3/uL — ABNORMAL HIGH (ref 150–400)
RBC: 4.98 MIL/uL (ref 3.87–5.11)
RDW: 14.9 % (ref 11.5–15.5)
WBC: 19.5 10*3/uL — ABNORMAL HIGH (ref 4.0–10.5)
nRBC: 0.4 % — ABNORMAL HIGH (ref 0.0–0.2)

## 2019-06-03 LAB — D-DIMER, QUANTITATIVE: D-Dimer, Quant: 0.5 ug/mL-FEU (ref 0.00–0.50)

## 2019-06-03 LAB — PHOSPHORUS: Phosphorus: 3.5 mg/dL (ref 2.5–4.6)

## 2019-06-03 LAB — CULTURE, BLOOD (ROUTINE X 2)
Culture: NO GROWTH
Special Requests: ADEQUATE

## 2019-06-03 LAB — FERRITIN: Ferritin: 337 ng/mL — ABNORMAL HIGH (ref 11–307)

## 2019-06-03 LAB — MAGNESIUM: Magnesium: 2.5 mg/dL — ABNORMAL HIGH (ref 1.7–2.4)

## 2019-06-03 MED ORDER — FUROSEMIDE 10 MG/ML IJ SOLN
40.0000 mg | Freq: Once | INTRAMUSCULAR | Status: AC
  Administered 2019-06-03: 40 mg via INTRAVENOUS
  Filled 2019-06-03: qty 4

## 2019-06-03 NOTE — Plan of Care (Signed)
  Problem: Coping: Goal: Psychosocial and spiritual needs will be supported Outcome: Progressing   Problem: Education: Goal: Knowledge of risk factors and measures for prevention of condition will improve Outcome: Progressing   Problem: Respiratory: Goal: Will maintain a patent airway Outcome: Progressing Goal: Complications related to the disease process, condition or treatment will be avoided or minimized Outcome: Progressing   Problem: Education: Goal: Knowledge of General Education information will improve Description: Including pain rating scale, medication(s)/side effects and non-pharmacologic comfort measures Outcome: Progressing   Problem: Clinical Measurements: Goal: Respiratory complications will improve Outcome: Progressing   Problem: Activity: Goal: Risk for activity intolerance will decrease Outcome: Progressing   Problem: Skin Integrity: Goal: Risk for impaired skin integrity will decrease Outcome: Progressing

## 2019-06-03 NOTE — Progress Notes (Signed)
PROGRESS NOTE                                                                                                                                                                                                             Patient Demographics:    Amanda Murphy, is a 38 y.o. female, DOB - March 07, 1981, [id number]  Outpatient Primary MD for the patient is Hoyt Koch, MD   Admit date - 05/29/2019   LOS - 4  Chief Complaint  Patient presents with  . Chest Pain  . Shortness of Breath       Brief Narrative: Patient is a 39 y.o. female with PMHx of history of VTE no longer on anticoagulation, suprasellar mass resection, morbid obesity-who presented to the hospital with complaints of shortness of breath, fatigue and malaise-she was found to have acute hypoxic respiratory failure in setting of COVID-19 pneumonia.   Subjective:   Continues to slowly improve-down to 3 L of oxygen this morning.   Assessment  & Plan :   Acute Hypoxic Resp Failure due to Covid 19 Viral pneumonia: Hypoxemia continues to improve-down to 3 L of oxygen today.  Suspect she has OSA at baseline-and requires more oxygen at night.  Fever: afebrile  O2 requirements:  SpO2: 93 % O2 Flow Rate (L/min): 3 L/min FiO2 (%): 100 %   COVID-19 Labs: Recent Labs    06/01/19 0305 06/02/19 0530 06/03/19 0413  DDIMER 0.99* 0.79* 0.50  FERRITIN 432* 576* 337*  CRP 10.8* 5.9* 2.8*    No results found for: BNP  Recent Labs  Lab 05/29/19 2216  PROCALCITON <0.10    No results found for: SARSCOV2NAA   COVID-19 Medications: Steroids: 12/21>> Remdesivir: 12/21>> 12/26 Actemra: X1 on 12/22 and on 12/23 Convalescent Plasma: X1 on 12/22  Other medications: Diuretics:Euvolemic-repeat Lasix 40 mg IV x1 to maintain negative balance. Antibiotics:Not needed as no evidence of bacterial infection Insulin: CBG stable on SSI while on steroids.  A1c  6.4-needs diet/lifestyle modification on discharge. . Prone/Incentive Spirometry: encouraged patient to lie prone for 3-4 hours at a time for a total of 16 hours a day, and to encourage incentive spirometry use 3-4/hour.  DVT Prophylaxis  :  Lovenox -twice daily dosing  Leukocytosis: Likely secondary to Solu-Medrol-no clinical indication of superimposed bacterial infection as clinical situation is overall improved.  AKI:  Resolved.  Transaminitis: Secondary to COVID-19-LFTs appears to be stabilizing-continue to monitor closely.  History of venous thromboembolism: Claims it was provoked from long airplane trip, surgery-and birth control.  Maintained on aspirin in the outpatient setting.  Continue twice daily dosing of Lovenox-follow D-dimer.  Given prior history of VTE-and ongoing severe hypoxemia from Covid-remains at risk for VTE.  Morbid obesity: Estimated body mass index is 60.09 kg/m as calculated from the following:   Height as of this encounter: 5\' 4"  (1.626 m).   Weight as of this encounter: 158.8 kg.   Consults  :  None  Procedures  :  None  ABG:    Component Value Date/Time   PHART 7.440 11/23/2014 1348   PCO2ART 23.2 (L) 11/23/2014 1348   PO2ART 70.0 (L) 11/23/2014 1348   HCO3 15.7 (L) 11/23/2014 1348   TCO2 16 11/23/2014 1348   ACIDBASEDEF 7.0 (H) 11/23/2014 1348   O2SAT 95.0 11/23/2014 1348    Vent Settings: N/A FiO2 (%):  [100 %] 100 %   Condition - Extremely Guarded  Family Communication  : Spoke with patient's sister on 12/26.  Code Status :  Full Code  Diet :  Diet Order            Diet regular Room service appropriate? Yes; Fluid consistency: Thin  Diet effective now               Disposition Plan  :  Remain hospitalized  Barriers to discharge: Hypoxia requiring O2 supplementation/complete 5 days of IV Remdesivir  Antimicorbials  :    Anti-infectives (From admission, onward)   Start     Dose/Rate Route Frequency Ordered Stop   05/31/19  1000  remdesivir 100 mg in sodium chloride 0.9 % 100 mL IVPB     100 mg 200 mL/hr over 30 Minutes Intravenous Daily 05/30/19 0322 06/04/19 1559   05/30/19 0415  remdesivir 200 mg in sodium chloride 0.9% 250 mL IVPB     200 mg 580 mL/hr over 30 Minutes Intravenous Once 05/30/19 0322 05/30/19 0624      Inpatient Medications  Scheduled Meds: . sodium chloride   Intravenous Once  . vitamin C  500 mg Oral Daily  . cholecalciferol  1,000 Units Oral Daily  . enoxaparin (LOVENOX) injection  80 mg Subcutaneous Q12H  . famotidine  20 mg Oral Daily  . insulin aspart  0-9 Units Subcutaneous TID WC  . mouth rinse  15 mL Mouth Rinse BID  . methylPREDNISolone (SOLU-MEDROL) injection  60 mg Intravenous Q12H  . zinc sulfate  220 mg Oral Daily   Continuous Infusions: . remdesivir 100 mg in NS 100 mL Stopped (06/02/19 0942)   PRN Meds:.acetaminophen **OR** acetaminophen, ondansetron **OR** ondansetron (ZOFRAN) IV   Time Spent in minutes  25  See all Orders from today for further details   Oren Binet M.D on 06/03/2019 at 1:45 PM  To page go to [email or web address] - use universal password  [facility] -  Office  [phone]    Objective:   Vitals:   06/02/19 2114 06/03/19 0348 06/03/19 0814 06/03/19 1152  BP:  119/78 (!) 135/91 116/70  Pulse:  100 97 90  Resp:  20 (!) 22 (!) 22  Temp: 98.2 F (36.8 C) 98 F (36.7 C) 97.8 F (36.6 C) 98.2 F (36.8 C)  TempSrc: Oral Oral Oral Oral  SpO2:  92% 93% 93%  Weight:      Height:        Wt Readings from  Last 3 Encounters:  05/30/19 (!) 158.8 kg  04/06/18 (!) 150.1 kg  03/17/18 (!) 151 kg    No intake or output data in the 24 hours ending 06/03/19 1345   Physical Exam Gen Exam:Alert awake-not in any distress HEENT:atraumatic, normocephalic Chest: B/L clear to auscultation anteriorly CVS:S1S2 regular Abdomen:soft non tender, non distended Extremities:no edema Neurology: Non focal Skin: no rash   Data Review:     CBC Recent Labs  Lab 05/30/19 0656 05/31/19 0310 06/01/19 0305 06/02/19 0530 06/03/19 0413  WBC 8.0 7.0 11.7* 20.2* 19.5*  HGB 14.2 13.9 14.6 15.4* 14.6  HCT 44.4 44.7 46.4* 50.5* 46.6*  PLT 339 358 399 476* 529*  MCV 93.1 93.9 93.9 95.1 93.6  MCH 29.8 29.2 29.6 29.0 29.3  MCHC 32.0 31.1 31.5 30.5 31.3  RDW 15.0 14.9 14.9 15.1 14.9  LYMPHSABS 0.9  --   --   --   --   MONOABS 0.3  --   --   --   --   EOSABS 0.0  --   --   --   --   BASOSABS 0.0  --   --   --   --     Chemistries  Recent Labs  Lab 05/30/19 0656 05/31/19 0310 06/01/19 0305 06/02/19 0530 06/03/19 0413  NA 141 137 143 143 142  K 4.2 4.0 4.5 4.3 4.3  CL 104 102 104 100 102  CO2 22 27 28 29 29   GLUCOSE 110* 101* 114* 114* 121*  BUN 9 14 17 20 18   CREATININE 0.72 0.72 0.81 0.97 0.75  CALCIUM 8.6* 8.7* 8.9 9.2 8.7*  MG  --  2.5* 2.5* 2.7* 2.5*  AST 75* 77* 85* 211* 98*  ALT 77* 85* 99* 263* 229*  ALKPHOS 54 55 58 67 59  BILITOT 1.4* 0.7 0.6 0.6 0.8   ------------------------------------------------------------------------------------------------------------------ No results for input(s): CHOL, HDL, LDLCALC, TRIG, CHOLHDL, LDLDIRECT in the last 72 hours.  Lab Results  Component Value Date   HGBA1C 6.4 (H) 06/01/2019   ------------------------------------------------------------------------------------------------------------------ No results for input(s): TSH, T4TOTAL, T3FREE, THYROIDAB in the last 72 hours.  Invalid input(s): FREET3 ------------------------------------------------------------------------------------------------------------------ Recent Labs    06/02/19 0530 06/03/19 0413  FERRITIN 576* 337*    Coagulation profile No results for input(s): INR, PROTIME in the last 168 hours.  Recent Labs    06/02/19 0530 06/03/19 0413  DDIMER 0.79* 0.50    Cardiac Enzymes No results for input(s): CKMB, TROPONINI, MYOGLOBIN in the last 168 hours.  Invalid input(s):  CK ------------------------------------------------------------------------------------------------------------------ No results found for: BNP  Micro Results Recent Results (from the past 240 hour(s))  Blood Culture (routine x 2)     Status: None   Collection Time: 05/29/19 10:15 PM   Specimen: BLOOD  Result Value Ref Range Status   Specimen Description BLOOD RIGHT ANTECUBITAL  Final   Special Requests   Final    BOTTLES DRAWN AEROBIC AND ANAEROBIC Blood Culture adequate volume   Culture   Final    NO GROWTH 5 DAYS Performed at [facility], [address], [place], [place] [address]    Report Status 06/03/2019 FINAL  Final  Blood Culture (routine x 2)     Status: None (Preliminary result)   Collection Time: 05/30/19 12:26 AM   Specimen: BLOOD RIGHT ARM  Result Value Ref Range Status   Specimen Description BLOOD RIGHT ARM  Final   Special Requests   Final    BOTTLES DRAWN AEROBIC AND ANAEROBIC Blood  Culture adequate volume   Culture   Final    NO GROWTH 4 DAYS Performed at [facility], [place] [address], [place], [place] [address]    Report Status PENDING  Incomplete    Radiology Reports DG Chest 2 View  Result Date: 05/29/2019 CLINICAL DATA:  Shortness of breath, wheezing and productive cough for 1 week, history asthma EXAM: CHEST - 2 VIEW COMPARISON:  11/26/2014 FINDINGS: Mild enlargement of cardiac silhouette. Mediastinal contours normal. Patchy BILATERAL pulmonary infiltrates most consistent with multifocal pneumonia. No pleural effusion or pneumothorax. Respiratory motion artifacts degrade lateral view. No acute osseous findings. IMPRESSION: Mild enlargement of cardiac silhouette. Patchy BILATERAL airspace infiltrates consistent with multifocal pneumonia, including atypical and viral etiologies. Electronically Signed   By: Lavonia Dana M.D.   On: 05/29/2019 19:21   CT ANGIO CHEST PE W OR WO CONTRAST  Result Date: 05/30/2019 CLINICAL DATA:  Shortness of breath  with fever.  COVID-19 positive EXAM: CT ANGIOGRAPHY CHEST WITH CONTRAST TECHNIQUE: Multidetector CT imaging of the chest was performed using the standard protocol during bolus administration of intravenous contrast. Multiplanar CT image reconstructions and MIPs were obtained to evaluate the vascular anatomy. CONTRAST:  75 mL OMNIPAQUE IOHEXOL 350 MG/ML SOLN COMPARISON:  CT angiogram chest November 23, 2014; chest radiograph May 29 2019 FINDINGS: Cardiovascular: There is no demonstrable pulmonary embolus. There is no thoracic aortic aneurysm or dissection. The visualized great vessels appear unremarkable. Note that the right innominate and left common carotid arteries arise as a common trunk, an anatomic variant. There is no pericardial effusion or pericardial thickening. Mediastinum/Nodes: Thyroid appears unremarkable. There is no demonstrable adenopathy. No esophageal lesions are appreciable. Lungs/Pleura: There is widespread airspace opacity throughout the lungs bilaterally involving all lobes in segments to varying degrees. There are scattered areas of mild consolidation; the bulk of the opacity bilaterally is more of a ground-glass type appearance. No pleural effusions are evident. Note that consolidation is most notable in the medial lower lobe regions bilaterally. Upper Abdomen: There is hepatic steatosis. Visualized upper abdominal structures appear unremarkable. Musculoskeletal: There are no blastic or lytic bone lesions. No chest wall lesions are evident. Review of the MIP images confirms the above findings. IMPRESSION: 1. No demonstrable pulmonary embolus. No thoracic aortic aneurysm or dissection. 2.  Widespread multifocal pneumonia.  No pleural effusions. 3.  No evident adenopathy. 4.  Hepatic steatosis. Electronically Signed   By: Lowella Grip III M.D.   On: 05/30/2019 07:21

## 2019-06-04 LAB — CBC
HCT: 49.6 % — ABNORMAL HIGH (ref 36.0–46.0)
Hemoglobin: 15.6 g/dL — ABNORMAL HIGH (ref 12.0–15.0)
MCH: 29.7 pg (ref 26.0–34.0)
MCHC: 31.5 g/dL (ref 30.0–36.0)
MCV: 94.3 fL (ref 80.0–100.0)
Platelets: 482 10*3/uL — ABNORMAL HIGH (ref 150–400)
RBC: 5.26 MIL/uL — ABNORMAL HIGH (ref 3.87–5.11)
RDW: 15.1 % (ref 11.5–15.5)
WBC: 23.9 10*3/uL — ABNORMAL HIGH (ref 4.0–10.5)
nRBC: 0.6 % — ABNORMAL HIGH (ref 0.0–0.2)

## 2019-06-04 LAB — COMPREHENSIVE METABOLIC PANEL
ALT: 186 U/L — ABNORMAL HIGH (ref 0–44)
AST: 59 U/L — ABNORMAL HIGH (ref 15–41)
Albumin: 3.6 g/dL (ref 3.5–5.0)
Alkaline Phosphatase: 60 U/L (ref 38–126)
Anion gap: 18 — ABNORMAL HIGH (ref 5–15)
BUN: 20 mg/dL (ref 6–20)
CO2: 27 mmol/L (ref 22–32)
Calcium: 8.8 mg/dL — ABNORMAL LOW (ref 8.9–10.3)
Chloride: 96 mmol/L — ABNORMAL LOW (ref 98–111)
Creatinine, Ser: 0.79 mg/dL (ref 0.44–1.00)
GFR calc Af Amer: >60 mL/min (ref >60)
GFR calc non Af Amer: >60 mL/min (ref >60)
Glucose, Bld: 112 mg/dL — ABNORMAL HIGH (ref 70–99)
Potassium: 4.8 mmol/L (ref 3.5–5.1)
Sodium: 141 mmol/L (ref 135–145)
Total Bilirubin: 1.3 mg/dL — ABNORMAL HIGH (ref 0.3–1.2)
Total Protein: 8.2 g/dL — ABNORMAL HIGH (ref 6.5–8.1)

## 2019-06-04 LAB — GLUCOSE, CAPILLARY
Glucose-Capillary: 135 mg/dL — ABNORMAL HIGH (ref 70–99)
Glucose-Capillary: 126 mg/dL — ABNORMAL HIGH (ref 70–99)
Glucose-Capillary: 106 mg/dL — ABNORMAL HIGH (ref 70–99)
Glucose-Capillary: 160 mg/dL — ABNORMAL HIGH (ref 70–99)

## 2019-06-04 LAB — D-DIMER, QUANTITATIVE: D-Dimer, Quant: 0.27 ug/mL-FEU (ref 0.00–0.50)

## 2019-06-04 LAB — CULTURE, BLOOD (ROUTINE X 2)
Culture: NO GROWTH
Special Requests: ADEQUATE

## 2019-06-04 LAB — C-REACTIVE PROTEIN: CRP: 1.7 mg/dL — ABNORMAL HIGH (ref <1.0)

## 2019-06-04 LAB — FERRITIN: Ferritin: 232 ng/mL (ref 11–307)

## 2019-06-04 MED ORDER — METHYLPREDNISOLONE SODIUM SUCC 40 MG IJ SOLR
40.0000 mg | Freq: Two times a day (BID) | INTRAMUSCULAR | Status: DC
  Administered 2019-06-05: 40 mg via INTRAVENOUS
  Filled 2019-06-04: qty 1

## 2019-06-04 NOTE — Progress Notes (Addendum)
PROGRESS NOTE                                                                                                                                                                                                             Patient Demographics:    Amanda Murphy, is a 38 y.o. female, DOB - [birth year], [id number]  Outpatient Primary MD for the patient is Hoyt Koch, MD   Admit date - 05/29/2019   LOS - 5  Chief Complaint  Patient presents with  . Chest Pain  . Shortness of Breath       Brief Narrative: Patient is a 38 y.o. female with PMHx of history of VTE no longer on anticoagulation, suprasellar mass resection, morbid obesity-who presented to the hospital with complaints of shortness of breath, fatigue and malaise-she was found to have acute hypoxic respiratory failure in setting of COVID-19 pneumonia.   Subjective:   Lying comfortably in bed-on 3 L of oxygen.  No major events overnight.   Assessment  & Plan :   Acute Hypoxic Resp Failure due to Covid 19 Viral pneumonia: Slowly improving-down to 3 L-have asked nursing staff to ambulate patient.  Suspect will require home O2.  She probably has OSA at baseline-as nursing staff have reported significant nocturnal desaturation.  Completed remdesivir-begin tapering steroids.   Assess for home O2 requirement prior to discharge.  Fever: afebrile  O2 requirements:  SpO2: 92 % O2 Flow Rate (L/min): 4 L/min FiO2 (%): 100 %   COVID-19 Labs: Recent Labs    06/02/19 0530 06/03/19 0413 06/04/19 0344  DDIMER 0.79* 0.50 0.27  FERRITIN 576* 337* 232  CRP 5.9* 2.8* 1.7*    No results found for: BNP  Recent Labs  Lab 05/29/19 2216  PROCALCITON <0.10    No results found for: SARSCOV2NAA   COVID-19 Medications: Steroids: 12/21>> Remdesivir: 12/21>> 12/26 Actemra: X1 on 12/22 and on 12/23 Convalescent Plasma: X1 on 12/22  Other  medications: Diuretics:Euvolemic-hold Lasix today. Antibiotics:Not needed as no evidence of bacterial infection Insulin: CBG stable on SSI while on steroids.  A1c 6.4-needs diet/lifestyle modification on discharge. . Prone/Incentive Spirometry: encouraged patient to lie prone for 3-4 hours at a time for a total of 16 hours a day, and to encourage incentive spirometry use 3-4/hour.  DVT Prophylaxis  :  Lovenox -twice daily dosing  Leukocytosis: Likely secondary to Solu-Medrol-no clinical indication of superimposed bacterial infection as clinical situation is overall improved.  AKI: Resolved.  Transaminitis: Secondary to COVID-19-LFTs appears to be stabilizing-continue to monitor closely.  History of venous thromboembolism: Claims it was provoked from long airplane trip, surgery-and birth control.  Maintained on aspirin in the outpatient setting.  Continue twice daily dosing of Lovenox-follow D-dimer.  Given prior history of VTE-and ongoing severe hypoxemia from Covid-remains at risk for VTE.  Morbid obesity: Estimated body mass index is 60.09 kg/m as calculated from the following:   Height as of this encounter: 5\' 4"  (1.626 m).   Weight as of this encounter: 158.8 kg.   Consults  :  None  Procedures  :  None  ABG:    Component Value Date/Time   PHART 7.440 11/23/2014 1348   PCO2ART 23.2 (L) 11/23/2014 1348   PO2ART 70.0 (L) 11/23/2014 1348   HCO3 15.7 (L) 11/23/2014 1348   TCO2 16 11/23/2014 1348   ACIDBASEDEF 7.0 (H) 11/23/2014 1348   O2SAT 95.0 11/23/2014 1348    Vent Settings: N/A     Condition -  Guarded  Family Communication  : Spoke with patient's sister on 12/26-we will update later.  Code Status :  Full Code  Diet :  Diet Order            Diet regular Room service appropriate? Yes; Fluid consistency: Thin  Diet effective now               Disposition Plan  :  Remain hospitalized-hopefully home in the next few days depending on clinical  progress.  Barriers to discharge: Hypoxia requiring O2 supplementation  Antimicorbials  :    Anti-infectives (From admission, onward)   Start     Dose/Rate Route Frequency Ordered Stop   05/31/19 1000  remdesivir 100 mg in sodium chloride 0.9 % 100 mL IVPB     100 mg 200 mL/hr over 30 Minutes Intravenous Daily 05/30/19 0322 06/03/19 1745   05/30/19 0415  remdesivir 200 mg in sodium chloride 0.9% 250 mL IVPB     200 mg 580 mL/hr over 30 Minutes Intravenous Once 05/30/19 0322 05/30/19 0624      Inpatient Medications  Scheduled Meds: . sodium chloride   Intravenous Once  . vitamin C  500 mg Oral Daily  . cholecalciferol  1,000 Units Oral Daily  . enoxaparin (LOVENOX) injection  80 mg Subcutaneous Q12H  . famotidine  20 mg Oral Daily  . insulin aspart  0-9 Units Subcutaneous TID WC  . mouth rinse  15 mL Mouth Rinse BID  . methylPREDNISolone (SOLU-MEDROL) injection  60 mg Intravenous Q12H  . zinc sulfate  220 mg Oral Daily   Continuous Infusions:  PRN Meds:.acetaminophen **OR** acetaminophen, ondansetron **OR** ondansetron (ZOFRAN) IV   Time Spent in minutes  25  See all Orders from today for further details   Oren Binet M.D on 06/04/2019 at 1:24 PM  To page go to [email or web address] - use universal password  [facility] -  Office  [phone]    Objective:   Vitals:   06/03/19 1536 06/03/19 2034 06/04/19 0642 06/04/19 0743  BP: 136/79 128/85 109/75 102/62  Pulse: 89 98 97 97  Resp: (!) 28 20  20   Temp: 97.6 F (36.4 C) 98.9 F (37.2 C) 98.7 F (37.1 C) 98.2 F (36.8 C)  TempSrc: Oral Oral Oral Oral  SpO2: 92% 90% 91% 92%  Weight:      Height:  Wt Readings from Last 3 Encounters:  05/30/19 (!) 158.8 kg  04/06/18 (!) 150.1 kg  03/17/18 (!) 151 kg    No intake or output data in the 24 hours ending 06/04/19 1324   Physical Exam Gen Exam:Alert awake-not in any distress HEENT:atraumatic, normocephalic Chest: B/L clear to auscultation  anteriorly CVS:S1S2 regular Abdomen:soft non tender, non distended Extremities:no edema Neurology: Non focal Skin: no rash  Data Review:    CBC Recent Labs  Lab 05/30/19 0656 05/31/19 0310 06/01/19 0305 06/02/19 0530 06/03/19 0413 06/04/19 0344  WBC 8.0 7.0 11.7* 20.2* 19.5* 23.9*  HGB 14.2 13.9 14.6 15.4* 14.6 15.6*  HCT 44.4 44.7 46.4* 50.5* 46.6* 49.6*  PLT 339 358 399 476* 529* 482*  MCV 93.1 93.9 93.9 95.1 93.6 94.3  MCH 29.8 29.2 29.6 29.0 29.3 29.7  MCHC 32.0 31.1 31.5 30.5 31.3 31.5  RDW 15.0 14.9 14.9 15.1 14.9 15.1  LYMPHSABS 0.9  --   --   --   --   --   MONOABS 0.3  --   --   --   --   --   EOSABS 0.0  --   --   --   --   --   BASOSABS 0.0  --   --   --   --   --     Chemistries  Recent Labs  Lab 05/31/19 0310 06/01/19 0305 06/02/19 0530 06/03/19 0413 06/04/19 0344  NA 137 143 143 142 141  K 4.0 4.5 4.3 4.3 4.8  CL 102 104 100 102 96*  CO2 27 28 29 29 27   GLUCOSE 101* 114* 114* 121* 112*  BUN 14 17 20 18 20   CREATININE 0.72 0.81 0.97 0.75 0.79  CALCIUM 8.7* 8.9 9.2 8.7* 8.8*  MG 2.5* 2.5* 2.7* 2.5*  --   AST 77* 85* 211* 98* 59*  ALT 85* 99* 263* 229* 186*  ALKPHOS 55 58 67 59 60  BILITOT 0.7 0.6 0.6 0.8 1.3*   ------------------------------------------------------------------------------------------------------------------ No results for input(s): CHOL, HDL, LDLCALC, TRIG, CHOLHDL, LDLDIRECT in the last 72 hours.  Lab Results  Component Value Date   HGBA1C 6.4 (H) 06/01/2019   ------------------------------------------------------------------------------------------------------------------ No results for input(s): TSH, T4TOTAL, T3FREE, THYROIDAB in the last 72 hours.  Invalid input(s): FREET3 ------------------------------------------------------------------------------------------------------------------ Recent Labs    06/03/19 0413 06/04/19 0344  FERRITIN 337* 232    Coagulation profile No results for input(s): INR, PROTIME in  the last 168 hours.  Recent Labs    06/03/19 0413 06/04/19 0344  DDIMER 0.50 0.27    Cardiac Enzymes No results for input(s): CKMB, TROPONINI, MYOGLOBIN in the last 168 hours.  Invalid input(s): CK ------------------------------------------------------------------------------------------------------------------ No results found for: BNP  Micro Results Recent Results (from the past 240 hour(s))  Blood Culture (routine x 2)     Status: None   Collection Time: 05/29/19 10:15 PM   Specimen: BLOOD  Result Value Ref Range Status   Specimen Description BLOOD RIGHT ANTECUBITAL  Final   Special Requests   Final    BOTTLES DRAWN AEROBIC AND ANAEROBIC Blood Culture adequate volume   Culture   Final    NO GROWTH 5 DAYS Performed at [facility], [address], [place], [place] [address]    Report Status 06/03/2019 FINAL  Final  Blood Culture (routine x 2)     Status: None   Collection Time: 05/30/19 12:26 AM   Specimen: BLOOD RIGHT ARM  Result Value Ref Range Status   Specimen  Description BLOOD RIGHT ARM  Final   Special Requests   Final    BOTTLES DRAWN AEROBIC AND ANAEROBIC Blood Culture adequate volume   Culture   Final    NO GROWTH 5 DAYS Performed at [facility], [address], [place], [place] [address]    Report Status 06/04/2019 FINAL  Final    Radiology Reports DG Chest 2 View  Result Date: 05/29/2019 CLINICAL DATA:  Shortness of breath, wheezing and productive cough for 1 week, history asthma EXAM: CHEST - 2 VIEW COMPARISON:  11/26/2014 FINDINGS: Mild enlargement of cardiac silhouette. Mediastinal contours normal. Patchy BILATERAL pulmonary infiltrates most consistent with multifocal pneumonia. No pleural effusion or pneumothorax. Respiratory motion artifacts degrade lateral view. No acute osseous findings. IMPRESSION: Mild enlargement of cardiac silhouette. Patchy BILATERAL airspace infiltrates consistent with multifocal pneumonia, including atypical and  viral etiologies. Electronically Signed   By: Lavonia Dana M.D.   On: 05/29/2019 19:21   CT ANGIO CHEST PE W OR WO CONTRAST  Result Date: 05/30/2019 CLINICAL DATA:  Shortness of breath with fever.  COVID-19 positive EXAM: CT ANGIOGRAPHY CHEST WITH CONTRAST TECHNIQUE: Multidetector CT imaging of the chest was performed using the standard protocol during bolus administration of intravenous contrast. Multiplanar CT image reconstructions and MIPs were obtained to evaluate the vascular anatomy. CONTRAST:  75 mL OMNIPAQUE IOHEXOL 350 MG/ML SOLN COMPARISON:  CT angiogram chest November 23, 2014; chest radiograph May 29 2019 FINDINGS: Cardiovascular: There is no demonstrable pulmonary embolus. There is no thoracic aortic aneurysm or dissection. The visualized great vessels appear unremarkable. Note that the right innominate and left common carotid arteries arise as a common trunk, an anatomic variant. There is no pericardial effusion or pericardial thickening. Mediastinum/Nodes: Thyroid appears unremarkable. There is no demonstrable adenopathy. No esophageal lesions are appreciable. Lungs/Pleura: There is widespread airspace opacity throughout the lungs bilaterally involving all lobes in segments to varying degrees. There are scattered areas of mild consolidation; the bulk of the opacity bilaterally is more of a ground-glass type appearance. No pleural effusions are evident. Note that consolidation is most notable in the medial lower lobe regions bilaterally. Upper Abdomen: There is hepatic steatosis. Visualized upper abdominal structures appear unremarkable. Musculoskeletal: There are no blastic or lytic bone lesions. No chest wall lesions are evident. Review of the MIP images confirms the above findings. IMPRESSION: 1. No demonstrable pulmonary embolus. No thoracic aortic aneurysm or dissection. 2.  Widespread multifocal pneumonia.  No pleural effusions. 3.  No evident adenopathy. 4.  Hepatic steatosis.  Electronically Signed   By: Lowella Grip III M.D.   On: 05/30/2019 07:21

## 2019-06-04 NOTE — Plan of Care (Signed)
  Problem: Education: Goal: Knowledge of risk factors and measures for prevention of condition will improve Outcome: Progressing   Problem: Coping: Goal: Psychosocial and spiritual needs will be supported Outcome: Progressing   Problem: Respiratory: Goal: Will maintain a patent airway Outcome: Progressing Goal: Complications related to the disease process, condition or treatment will be avoided or minimized Outcome: Progressing   Problem: Education: Goal: Knowledge of General Education information will improve Description: Including pain rating scale, medication(s)/side effects and non-pharmacologic comfort measures Outcome: Progressing   Problem: Clinical Measurements: Goal: Respiratory complications will improve Outcome: Progressing   Problem: Activity: Goal: Risk for activity intolerance will decrease Outcome: Progressing   Problem: Skin Integrity: Goal: Risk for impaired skin integrity will decrease Outcome: Progressing

## 2019-06-04 NOTE — Progress Notes (Signed)
Pt oxygen saturation on rooms at while at rest is 93%. While ambulating on room air she ranged from 89-96%.

## 2019-06-05 LAB — COMPREHENSIVE METABOLIC PANEL
ALT: 147 U/L — ABNORMAL HIGH (ref 0–44)
AST: 41 U/L (ref 15–41)
Albumin: 3.2 g/dL — ABNORMAL LOW (ref 3.5–5.0)
Alkaline Phosphatase: 56 U/L (ref 38–126)
Anion gap: 11 (ref 5–15)
BUN: 22 mg/dL — ABNORMAL HIGH (ref 6–20)
CO2: 30 mmol/L (ref 22–32)
Calcium: 9 mg/dL (ref 8.9–10.3)
Chloride: 99 mmol/L (ref 98–111)
Creatinine, Ser: 0.82 mg/dL (ref 0.44–1.00)
GFR calc Af Amer: >60 mL/min (ref >60)
GFR calc non Af Amer: >60 mL/min (ref >60)
Glucose, Bld: 130 mg/dL — ABNORMAL HIGH (ref 70–99)
Potassium: 4.4 mmol/L (ref 3.5–5.1)
Sodium: 140 mmol/L (ref 135–145)
Total Bilirubin: 0.8 mg/dL (ref 0.3–1.2)
Total Protein: 7.3 g/dL (ref 6.5–8.1)

## 2019-06-05 LAB — GLUCOSE, CAPILLARY
Glucose-Capillary: 179 mg/dL — ABNORMAL HIGH (ref 70–99)
Glucose-Capillary: 133 mg/dL — ABNORMAL HIGH (ref 70–99)
Glucose-Capillary: 113 mg/dL — ABNORMAL HIGH (ref 70–99)

## 2019-06-05 LAB — C-REACTIVE PROTEIN: CRP: 0.8 mg/dL (ref <1.0)

## 2019-06-05 LAB — CBC
HCT: 48.7 % — ABNORMAL HIGH (ref 36.0–46.0)
Hemoglobin: 15.4 g/dL — ABNORMAL HIGH (ref 12.0–15.0)
MCH: 29.6 pg (ref 26.0–34.0)
MCHC: 31.6 g/dL (ref 30.0–36.0)
MCV: 93.5 fL (ref 80.0–100.0)
Platelets: 596 10*3/uL — ABNORMAL HIGH (ref 150–400)
RBC: 5.21 MIL/uL — ABNORMAL HIGH (ref 3.87–5.11)
RDW: 15.2 % (ref 11.5–15.5)
WBC: 22 10*3/uL — ABNORMAL HIGH (ref 4.0–10.5)
nRBC: 0.2 % (ref 0.0–0.2)

## 2019-06-05 LAB — D-DIMER, QUANTITATIVE: D-Dimer, Quant: 0.49 ug/mL-FEU (ref 0.00–0.50)

## 2019-06-05 LAB — FERRITIN: Ferritin: 210 ng/mL (ref 11–307)

## 2019-06-05 MED ORDER — PREDNISONE 10 MG PO TABS: ORAL_TABLET | ORAL | 0 refills | Status: DC

## 2019-06-05 NOTE — Discharge Instructions (Signed)
Person Under Monitoring Name: Amanda Murphy  Location: [place] [address]   Infection Prevention Recommendations for Individuals Confirmed to have, or Being Evaluated for, 2019 Novel Coronavirus (COVID-19) Infection Who Receive Care at Home  Individuals who are confirmed to have, or are being evaluated for, COVID-19 should follow the prevention steps below until a healthcare provider or local or state health department says they can return to normal activities.  Stay home except to get medical care You should restrict activities outside your home, except for getting medical care. Do not go to work, school, or public areas, and do not use public transportation or taxis.  Call ahead before visiting your doctor Before your medical appointment, call the healthcare provider and tell them that you have, or are being evaluated for, COVID-19 infection. This will help the healthcare providers office take steps to keep other people from getting infected. Ask your healthcare provider to call the local or state health department.  Monitor your symptoms Seek prompt medical attention if your illness is worsening (e.g., difficulty breathing). Before going to your medical appointment, call the healthcare provider and tell them that you have, or are being evaluated for, COVID-19 infection. Ask your healthcare provider to call the local or state health department.  Wear a facemask You should wear a facemask that covers your nose and mouth when you are in the same room with other people and when you visit a healthcare provider. People who live with or visit you should also wear a facemask while they are in the same room with you.  Separate yourself from other people in your home As much as possible, you should stay in a different room from other people in your home. Also, you should use a separate bathroom, if available.  Avoid sharing household items You should not  share dishes, drinking glasses, cups, eating utensils, towels, bedding, or other items with other people in your home. After using these items, you should wash them thoroughly with soap and water.  Cover your coughs and sneezes Cover your mouth and nose with a tissue when you cough or sneeze, or you can cough or sneeze into your sleeve. Throw used tissues in a lined trash can, and immediately wash your hands with soap and water for at least 20 seconds or use an alcohol-based hand rub.  Wash your [facility] your hands often and thoroughly with soap and water for at least 20 seconds. You can use an alcohol-based hand sanitizer if soap and water are not available and if your hands are not visibly dirty. Avoid touching your eyes, nose, and mouth with unwashed hands.   Prevention Steps for Caregivers and Household Members of Individuals Confirmed to have, or Being Evaluated for, COVID-19 Infection Being Cared for in the Home  If you live with, or provide care at home for, a person confirmed to have, or being evaluated for, COVID-19 infection please follow these guidelines to prevent infection:  Follow healthcare providers instructions Make sure that you understand and can help the patient follow any healthcare provider instructions for all care.  Provide for the patients basic needs You should help the patient with basic needs in the home and provide support for getting groceries, prescriptions, and other personal needs.  Monitor the patients symptoms If they are getting sicker, call his or her medical provider and tell them that the patient has, or is being evaluated for, COVID-19 infection. This will help the healthcare providers office  take steps to keep other people from getting infected. Ask the healthcare provider to call the local or state health department.  Limit the number of people who have contact with the patient  If possible, have only one caregiver for the  patient.  Other household members should stay in another home or place of residence. If this is not possible, they should stay  in another room, or be separated from the patient as much as possible. Use a separate bathroom, if available.  Restrict visitors who do not have an essential need to be in the home.  Keep older adults, very young children, and other sick people away from the patient Keep older adults, very young children, and those who have compromised immune systems or chronic health conditions away from the patient. This includes people with chronic heart, lung, or kidney conditions, diabetes, and cancer.  Ensure good ventilation Make sure that shared spaces in the home have good air flow, such as from an air conditioner or an opened window, weather permitting.  Wash your hands often  Wash your hands often and thoroughly with soap and water for at least 20 seconds. You can use an alcohol based hand sanitizer if soap and water are not available and if your hands are not visibly dirty.  Avoid touching your eyes, nose, and mouth with unwashed hands.  Use disposable paper towels to dry your hands. If not available, use dedicated cloth towels and replace them when they become wet.  Wear a facemask and gloves  Wear a disposable facemask at all times in the room and gloves when you touch or have contact with the patients blood, body fluids, and/or secretions or excretions, such as sweat, saliva, sputum, nasal mucus, vomit, urine, or feces.  Ensure the mask fits over your nose and mouth tightly, and do not touch it during use.  Throw out disposable facemasks and gloves after using them. Do not reuse.  Wash your hands immediately after removing your facemask and gloves.  If your personal clothing becomes contaminated, carefully remove clothing and launder. Wash your hands after handling contaminated clothing.  Place all used disposable facemasks, gloves, and other waste in a lined  container before disposing them with other household waste.  Remove gloves and wash your hands immediately after handling these items.  Do not share dishes, glasses, or other household items with the patient  Avoid sharing household items. You should not share dishes, drinking glasses, cups, eating utensils, towels, bedding, or other items with a patient who is confirmed to have, or being evaluated for, COVID-19 infection.  After the person uses these items, you should wash them thoroughly with soap and water.  Wash laundry thoroughly  Immediately remove and wash clothes or bedding that have blood, body fluids, and/or secretions or excretions, such as sweat, saliva, sputum, nasal mucus, vomit, urine, or feces, on them.  Wear gloves when handling laundry from the patient.  Read and follow directions on labels of laundry or clothing items and detergent. In general, wash and dry with the warmest temperatures recommended on the label.  Clean all areas the individual has used often  Clean all touchable surfaces, such as counters, tabletops, doorknobs, bathroom fixtures, toilets, phones, keyboards, tablets, and bedside tables, every day. Also, clean any surfaces that may have blood, body fluids, and/or secretions or excretions on them.  Wear gloves when cleaning surfaces the patient has come in contact with.  Use a diluted bleach solution (e.g., dilute bleach with 1 part  bleach and 10 parts water) or a household disinfectant with a label that says EPA-registered for coronaviruses. To make a bleach solution at home, add 1 tablespoon of bleach to 1 quart (4 cups) of water. For a larger supply, add  cup of bleach to 1 gallon (16 cups) of water.  Read labels of cleaning products and follow recommendations provided on product labels. Labels contain instructions for safe and effective use of the cleaning product including precautions you should take when applying the product, such as wearing gloves or  eye protection and making sure you have good ventilation during use of the product.  Remove gloves and wash hands immediately after cleaning.  Monitor yourself for signs and symptoms of illness Caregivers and household members are considered close contacts, should monitor their health, and will be asked to limit movement outside of the home to the extent possible. Follow the monitoring steps for close contacts listed on the symptom monitoring form.   ? If you have additional questions, contact your local health department or call the epidemiologist on call at [phone] (available 24/7). ? This guidance is subject to change. For the most up-to-date guidance from [facility], please refer to their website: [email or web address]

## 2019-06-05 NOTE — Discharge Summary (Signed)
PATIENT DETAILS Name: Amanda Murphy Age: 38 y.o. Sex: female Date of Birth: March 03, 1981 MRN: [id number]. Admitting Physician: Rise Patience, MD [id number], Real Cons, MD  Admit Date: 05/29/2019 Discharge date: 06/05/2019  Recommendations for Outpatient Follow-up:  1. Follow up with PCP in 1-2 weeks 2. Please obtain CMP/CBC in one week 3. Repeat Chest Xray in 4-6 week 4. Probably has sleep apnea-needs outpatient sleep study.  Admitted From:  Home  Disposition: [place]: No  Equipment/Devices: None  Discharge Condition: Stable  CODE STATUS: FULL CODE  Diet recommendation:  Diet Order            Diet - low sodium heart healthy        Diet regular Room service appropriate? Yes; Fluid consistency: Thin  Diet effective now               Brief Summary: See H&P , Labs, Consult and Test reports for all details in brief, Patient is a 38 y.o. female with PMHx of history of VTE no longer on anticoagulation, suprasellar mass resection, morbid obesity-who presented to the hospital with complaints of shortness of breath, fatigue and malaise-she was found to have acute hypoxic respiratory failure in setting of COVID-19 pneumonia.  Brief Hospital Course: Acute Hypoxic Resp Failure due to Covid 19 Viral pneumonia:  Significantly better-treated with steroids/remdesivir and Actemra.  At one point she was on 15 L of HFNC-she has been titrated down to room air since yesterday.  She was ambulated by the nursing staff-she does not require home O2.  Stable for discharge on tapering steroids.  Note-patient had numerous episodes of nocturnal desaturation during this hospital stay-she most probably has OSA-she has been advised that she needs a outpatient sleep study.  COVID-19 Labs:  Recent Labs    06/03/19 0413 06/04/19 0344 06/05/19 0534  DDIMER 0.50 0.27 0.49  FERRITIN 337* 232 210  CRP 2.8* 1.7* 0.8    No results found for: SARSCOV2NAA   COVID-19  Medications: Steroids: 12/21>> Remdesivir: 12/21>> 12/26 Actemra: X1 on 12/22 and on 12/23 Convalescent Plasma: X1 on 12/22  Leukocytosis: Likely secondary to Solu-Medrol-no clinical indication of superimposed bacterial infection as clinical situation is overall improved.  AKI: Resolved.  Transaminitis: Secondary to COVID-19-LFTs rapidly improving-please recheck in 1 week at follow-up with PCP.[name]  History of venous thromboembolism: Claims it was provoked from long airplane trip, surgery-and birth control.  Maintained on aspirin in the outpatient setting.    Managed with intermediate dosing of prophylactic Lovenox-resume aspirin on discharge.   Morbid obesity: Encouraged weight loss. Estimated body mass index is 60.09 kg/m as calculated from the following:   Height as of this encounter: 5\' 4"  (1.626 m).   Weight as of this encounter: 158.8 kg.    Procedures/Studies: None  Discharge Diagnoses:  Principal Problem:   Acute respiratory failure with hypoxia (HCC) Active Problems:   Morbid obesity ([place])   Pneumonia due to COVID-19 virus   Discharge Instructions:    Person Under Monitoring Name: Amanda Murphy  Location: [place] [address]   Infection Prevention Recommendations for Individuals Confirmed to have, or Being Evaluated for, 2019 Novel Coronavirus (COVID-19) Infection Who Receive Care at Home  Individuals who are confirmed to have, or are being evaluated for, COVID-19 should follow the prevention steps below until a healthcare provider or local or state health department says they can return to normal activities.  Stay home except to get medical care You should restrict  activities outside your home, except for getting medical care. Do not go to work, school, or public areas, and do not use public transportation or taxis.  Call ahead before visiting your doctor Before your medical appointment, call the healthcare provider and tell them  that you have, or are being evaluated for, COVID-19 infection. This will help the healthcare provider's office take steps to keep other people from getting infected. Ask your healthcare provider to call the local or state health department.  Monitor your symptoms Seek prompt medical attention if your illness is worsening (e.g., difficulty breathing). Before going to your medical appointment, call the healthcare provider and tell them that you have, or are being evaluated for, COVID-19 infection. Ask your healthcare provider to call the local or state health department.  Wear a facemask You should wear a facemask that covers your nose and mouth when you are in the same room with other people and when you visit a healthcare provider. People who live with or visit you should also wear a facemask while they are in the same room with you.  Separate yourself from other people in your home As much as possible, you should stay in a different room from other people in your home. Also, you should use a separate bathroom, if available.  Avoid sharing household items You should not share dishes, drinking glasses, cups, eating utensils, towels, bedding, or other items with other people in your home. After using these items, you should wash them thoroughly with soap and water.  Cover your coughs and sneezes Cover your mouth and nose with a tissue when you cough or sneeze, or you can cough or sneeze into your sleeve. Throw used tissues in a lined trash can, and immediately wash your hands with soap and water for at least 20 seconds or use an alcohol-based hand rub.  Wash your [facility] your hands often and thoroughly with soap and water for at least 20 seconds. You can use an alcohol-based hand sanitizer if soap and water are not available and if your hands are not visibly dirty. Avoid touching your eyes, nose, and mouth with unwashed hands.   Prevention Steps for Caregivers and Household Members  of Individuals Confirmed to have, or Being Evaluated for, COVID-19 Infection Being Cared for in the Home  If you live with, or provide care at home for, a person confirmed to have, or being evaluated for, COVID-19 infection please follow these guidelines to prevent infection:  Follow healthcare provider's instructions Make sure that you understand and can help the patient follow any healthcare provider instructions for all care.  Provide for the patient's basic needs You should help the patient with basic needs in the home and provide support for getting groceries, prescriptions, and other personal needs.  Monitor the patient's symptoms If they are getting sicker, call his or her medical provider and tell them that the patient has, or is being evaluated for, COVID-19 infection. This will help the healthcare provider's office take steps to keep other people from getting infected. Ask the healthcare provider to call the local or state health department.  Limit the number of people who have contact with the patient  If possible, have only one caregiver for the patient.  Other household members should stay in another home or place of residence. If this is not possible, they should stay  in another room, or be separated from the patient as much as possible. Use a separate bathroom, if available.  Restrict visitors  who do not have an essential need to be in the home.  Keep older adults, very young children, and other sick people away from the patient Keep older adults, very young children, and those who have compromised immune systems or chronic health conditions away from the patient. This includes people with chronic heart, lung, or kidney conditions, diabetes, and cancer.  Ensure good ventilation Make sure that shared spaces in the home have good air flow, such as from an air conditioner or an opened window, weather permitting.  Wash your hands often  Wash your hands often and  thoroughly with soap and water for at least 20 seconds. You can use an alcohol based hand sanitizer if soap and water are not available and if your hands are not visibly dirty.  Avoid touching your eyes, nose, and mouth with unwashed hands.  Use disposable paper towels to dry your hands. If not available, use dedicated cloth towels and replace them when they become wet.  Wear a facemask and gloves  Wear a disposable facemask at all times in the room and gloves when you touch or have contact with the patient's blood, body fluids, and/or secretions or excretions, such as sweat, saliva, sputum, nasal mucus, vomit, urine, or feces.  Ensure the mask fits over your nose and mouth tightly, and do not touch it during use.  Throw out disposable facemasks and gloves after using them. Do not reuse.  Wash your hands immediately after removing your facemask and gloves.  If your personal clothing becomes contaminated, carefully remove clothing and launder. Wash your hands after handling contaminated clothing.  Place all used disposable facemasks, gloves, and other waste in a lined container before disposing them with other household waste.  Remove gloves and wash your hands immediately after handling these items.  Do not share dishes, glasses, or other household items with the patient  Avoid sharing household items. You should not share dishes, drinking glasses, cups, eating utensils, towels, bedding, or other items with a patient who is confirmed to have, or being evaluated for, COVID-19 infection.  After the person uses these items, you should wash them thoroughly with soap and water.  Wash laundry thoroughly  Immediately remove and wash clothes or bedding that have blood, body fluids, and/or secretions or excretions, such as sweat, saliva, sputum, nasal mucus, vomit, urine, or feces, on them.  Wear gloves when handling laundry from the patient.  Read and follow directions on labels of laundry or  clothing items and detergent. In general, wash and dry with the warmest temperatures recommended on the label.  Clean all areas the individual has used often  Clean all touchable surfaces, such as counters, tabletops, doorknobs, bathroom fixtures, toilets, phones, keyboards, tablets, and bedside tables, every day. Also, clean any surfaces that may have blood, body fluids, and/or secretions or excretions on them.  Wear gloves when cleaning surfaces the patient has come in contact with.  Use a diluted bleach solution (e.g., dilute bleach with 1 part bleach and 10 parts water) or a household disinfectant with a label that says EPA-registered for coronaviruses. To make a bleach solution at home, add 1 tablespoon of bleach to 1 quart (4 cups) of water. For a larger supply, add  cup of bleach to 1 gallon (16 cups) of water.  Read labels of cleaning products and follow recommendations provided on product labels. Labels contain instructions for safe and effective use of the cleaning product including precautions you should take when applying the product, such  as wearing gloves or eye protection and making sure you have good ventilation during use of the product.  Remove gloves and wash hands immediately after cleaning.  Monitor yourself for signs and symptoms of illness Caregivers and household members are considered close contacts, should monitor their health, and will be asked to limit movement outside of the home to the extent possible. Follow the monitoring steps for close contacts listed on the symptom monitoring form.   ? If you have additional questions, contact your local health department or call the epidemiologist on call at [phone] (available 24/7). ? This guidance is subject to change. For the most up-to-date guidance from CDC, please refer to their website: YouBlogs.pl    Activity:  As tolerated   Discharge  Instructions    Call MD for:  difficulty breathing, headache or visual disturbances   Complete by: As directed    Call MD for:  extreme fatigue   Complete by: As directed    Call MD for:  persistant dizziness or light-headedness   Complete by: As directed    Call MD for:  persistant nausea and vomiting   Complete by: As directed    Diet - low sodium heart healthy   Complete by: As directed    Discharge instructions   Complete by: As directed    Follow with Primary MD  Hoyt Koch, MD in 1-2 weeks  You will need a sleep study done in the outpatient setting-please ask your primary care practitioner for the referral.  Please get a complete blood count and chemistry panel checked by your Primary MD at your next visit, and again as instructed by your Primary MD.  Get Medicines reviewed and adjusted: Please take all your medications with you for your next visit with your Primary MD  Laboratory/radiological data: Please request your Primary MD to go over all hospital tests and procedure/radiological results at the follow up, please ask your Primary MD to get all Hospital records sent to his/her office.  In some cases, they will be blood work, cultures and biopsy results pending at the time of your discharge. Please request that your primary care M.D. follows up on these results.  Also Note the following: If you experience worsening of your admission symptoms, develop shortness of breath, life threatening emergency, suicidal or homicidal thoughts you must seek medical attention immediately by calling 911 or calling your MD immediately  if symptoms less severe.  You must read complete instructions/literature along with all the possible adverse reactions/side effects for all the Medicines you take and that have been prescribed to you. Take any new Medicines after you have completely understood and accpet all the possible adverse reactions/side effects.   Do not drive when taking Pain  medications or sleeping medications (Benzodaizepines)  Do not take more than prescribed Pain, Sleep and Anxiety Medications. It is not advisable to combine anxiety,sleep and pain medications without talking with your primary care practitioner  Special Instructions: If you have smoked or chewed Tobacco  in the last 2 yrs please stop smoking, stop any regular Alcohol  and or any Recreational drug use.  Wear Seat belts while driving.  Please note: You were cared for by a hospitalist during your hospital stay. Once you are discharged, your primary care physician will handle any further medical issues. Please note that NO REFILLS for any discharge medications will be authorized once you are discharged, as it is imperative that you return to your primary care physician (or establish a  relationship with a primary care physician if you do not have one) for your post hospital discharge needs so that they can reassess your need for medications and monitor your lab values.   1).  3 weeks of isolation from 05/29/2019.   Increase activity slowly   Complete by: As directed      Allergies as of 06/05/2019   No Known Allergies     Medication List    STOP taking these medications   levocetirizine 5 MG tablet Commonly known as: XYZAL   Naltrexone-buPROPion HCl ER 8-90 MG Tb12   Vitamin D (Ergocalciferol) 1.25 MG (50000 UT) Caps capsule Commonly known as: DRISDOL     TAKE these medications   acetaminophen 500 MG tablet Commonly known as: TYLENOL Take 1,000 mg by mouth every 6 (six) hours as needed for mild pain.   aspirin EC 325 MG tablet Take 325 mg by mouth daily.   levonorgestrel 20 MCG/24HR IUD Commonly known as: MIRENA 1 each by Intrauterine route once.   multivitamin with minerals Tabs tablet Take 1 tablet by mouth daily.   naphazoline-pheniramine 0.025-0.3 % ophthalmic solution Commonly known as: NAPHCON-A Place 1 drop into both eyes 4 (four) times daily as needed for eye  irritation or allergies.   predniSONE 10 MG tablet Commonly known as: DELTASONE Take 40 mg daily for 1 day, 30 mg daily for 1 day, 20 mg daily for 1 days,10 mg daily for 1 day, then stop      Follow-up Information    Hoyt Koch, MD. Schedule an appointment as soon as possible for a visit in 1 week(s).   Specialty: Internal Medicine Contact information: [place] [phone]          No Known Allergies    Consultations:  None   Other Procedures/Studies: DG Chest 2 View  Result Date: 05/29/2019 CLINICAL DATA:  Shortness of breath, wheezing and productive cough for 1 week, history asthma EXAM: CHEST - 2 VIEW COMPARISON:  11/26/2014 FINDINGS: Mild enlargement of cardiac silhouette. Mediastinal contours normal. Patchy BILATERAL pulmonary infiltrates most consistent with multifocal pneumonia. No pleural effusion or pneumothorax. Respiratory motion artifacts degrade lateral view. No acute osseous findings. IMPRESSION: Mild enlargement of cardiac silhouette. Patchy BILATERAL airspace infiltrates consistent with multifocal pneumonia, including atypical and viral etiologies. Electronically Signed   By: Lavonia Dana M.D.   On: 05/29/2019 19:21   CT ANGIO CHEST PE W OR WO CONTRAST  Result Date: 05/30/2019 CLINICAL DATA:  Shortness of breath with fever.  COVID-19 positive EXAM: CT ANGIOGRAPHY CHEST WITH CONTRAST TECHNIQUE: Multidetector CT imaging of the chest was performed using the standard protocol during bolus administration of intravenous contrast. Multiplanar CT image reconstructions and MIPs were obtained to evaluate the vascular anatomy. CONTRAST:  75 mL OMNIPAQUE IOHEXOL 350 MG/ML SOLN COMPARISON:  CT angiogram chest November 23, 2014; chest radiograph May 29 2019 FINDINGS: Cardiovascular: There is no demonstrable pulmonary embolus. There is no thoracic aortic aneurysm or dissection. The visualized great vessels appear unremarkable. Note  that the right innominate and left common carotid arteries arise as a common trunk, an anatomic variant. There is no pericardial effusion or pericardial thickening. Mediastinum/Nodes: Thyroid appears unremarkable. There is no demonstrable adenopathy. No esophageal lesions are appreciable. Lungs/Pleura: There is widespread airspace opacity throughout the lungs bilaterally involving all lobes in segments to varying degrees. There are scattered areas of mild consolidation; the bulk of the opacity bilaterally is more of a ground-glass type appearance. No pleural  effusions are evident. Note that consolidation is most notable in the medial lower lobe regions bilaterally. Upper Abdomen: There is hepatic steatosis. Visualized upper abdominal structures appear unremarkable. Musculoskeletal: There are no blastic or lytic bone lesions. No chest wall lesions are evident. Review of the MIP images confirms the above findings. IMPRESSION: 1. No demonstrable pulmonary embolus. No thoracic aortic aneurysm or dissection. 2.  Widespread multifocal pneumonia.  No pleural effusions. 3.  No evident adenopathy. 4.  Hepatic steatosis. Electronically Signed   By: Lowella Grip III M.D.   On: 05/30/2019 07:21     TODAY-DAY OF DISCHARGE:  Subjective:   Amanda Murphy today has no headache,no chest abdominal pain,no new weakness tingling or numbness, feels much better wants to go home today.   Objective:   Blood pressure 97/69, pulse (!) 105, temperature 98.1 F (36.7 C), temperature source Oral, resp. rate (!) 23, height 5\' 4"  (1.626 m), weight (!) 158.8 kg, SpO2 94 %. No intake or output data in the 24 hours ending 06/05/19 1035 Filed Weights   05/30/19 1337 05/30/19 1710  Weight: (!) 158.8 kg (!) 158.8 kg    Exam: Awake Alert, Oriented *3, No new F.N deficits, Normal affect [place].AT,PERRAL Supple Neck,No JVD, No cervical lymphadenopathy appriciated.  Symmetrical Chest wall movement, Good air movement bilaterally,  CTAB RRR,No Gallops,Rubs or new Murmurs, No Parasternal Heave +ve B.Sounds, Abd Soft, Non tender, No organomegaly appriciated, No rebound -guarding or rigidity. No Cyanosis, Clubbing or edema, No new Rash or bruise   PERTINENT RADIOLOGIC STUDIES: DG Chest 2 View  Result Date: 05/29/2019 CLINICAL DATA:  Shortness of breath, wheezing and productive cough for 1 week, history asthma EXAM: CHEST - 2 VIEW COMPARISON:  11/26/2014 FINDINGS: Mild enlargement of cardiac silhouette. Mediastinal contours normal. Patchy BILATERAL pulmonary infiltrates most consistent with multifocal pneumonia. No pleural effusion or pneumothorax. Respiratory motion artifacts degrade lateral view. No acute osseous findings. IMPRESSION: Mild enlargement of cardiac silhouette. Patchy BILATERAL airspace infiltrates consistent with multifocal pneumonia, including atypical and viral etiologies. Electronically Signed   By: Lavonia Dana M.D.   On: 05/29/2019 19:21   CT ANGIO CHEST PE W OR WO CONTRAST  Result Date: 05/30/2019 CLINICAL DATA:  Shortness of breath with fever.  COVID-19 positive EXAM: CT ANGIOGRAPHY CHEST WITH CONTRAST TECHNIQUE: Multidetector CT imaging of the chest was performed using the standard protocol during bolus administration of intravenous contrast. Multiplanar CT image reconstructions and MIPs were obtained to evaluate the vascular anatomy. CONTRAST:  75 mL OMNIPAQUE IOHEXOL 350 MG/ML SOLN COMPARISON:  CT angiogram chest November 23, 2014; chest radiograph May 29 2019 FINDINGS: Cardiovascular: There is no demonstrable pulmonary embolus. There is no thoracic aortic aneurysm or dissection. The visualized great vessels appear unremarkable. Note that the right innominate and left common carotid arteries arise as a common trunk, an anatomic variant. There is no pericardial effusion or pericardial thickening. Mediastinum/Nodes: Thyroid appears unremarkable. There is no demonstrable adenopathy. No esophageal lesions are  appreciable. Lungs/Pleura: There is widespread airspace opacity throughout the lungs bilaterally involving all lobes in segments to varying degrees. There are scattered areas of mild consolidation; the bulk of the opacity bilaterally is more of a ground-glass type appearance. No pleural effusions are evident. Note that consolidation is most notable in the medial lower lobe regions bilaterally. Upper Abdomen: There is hepatic steatosis. Visualized upper abdominal structures appear unremarkable. Musculoskeletal: There are no blastic or lytic bone lesions. No chest wall lesions are evident. Review of the MIP images confirms the above findings.  IMPRESSION: 1. No demonstrable pulmonary embolus. No thoracic aortic aneurysm or dissection. 2.  Widespread multifocal pneumonia.  No pleural effusions. 3.  No evident adenopathy. 4.  Hepatic steatosis. Electronically Signed   By: Lowella Grip III M.D.   On: 05/30/2019 07:21     PERTINENT LAB RESULTS: CBC: Recent Labs    06/04/19 0344 06/05/19 0534  WBC 23.9* 22.0*  HGB 15.6* 15.4*  HCT 49.6* 48.7*  PLT 482* 596*   CMET CMP     Component Value Date/Time   NA 140 06/05/2019 0534   NA 141 11/16/2017 1141   K 4.4 06/05/2019 0534   CL 99 06/05/2019 0534   CO2 30 06/05/2019 0534   GLUCOSE 130 (H) 06/05/2019 0534   BUN 22 (H) 06/05/2019 0534   BUN 9 11/16/2017 1141   CREATININE 0.82 06/05/2019 0534   CALCIUM 9.0 06/05/2019 0534   PROT 7.3 06/05/2019 0534   PROT 7.0 11/16/2017 1141   ALBUMIN 3.2 (L) 06/05/2019 0534   ALBUMIN 3.8 11/16/2017 1141   AST 41 06/05/2019 0534   ALT 147 (H) 06/05/2019 0534   ALKPHOS 56 06/05/2019 0534   BILITOT 0.8 06/05/2019 0534   BILITOT 0.5 11/16/2017 1141   GFRNONAA >60 06/05/2019 0534   GFRAA >60 06/05/2019 0534    GFR Estimated Creatinine Clearance: 140 mL/min (by C-G formula based on SCr of 0.82 mg/dL). No results for input(s): LIPASE, AMYLASE in the last 72 hours. No results for input(s): CKTOTAL, CKMB,  CKMBINDEX, TROPONINI in the last 72 hours. Invalid input(s): Peak    06/04/19 0344 06/05/19 0534  DDIMER 0.27 0.49   No results for input(s): HGBA1C in the last 72 hours. No results for input(s): CHOL, HDL, LDLCALC, TRIG, CHOLHDL, LDLDIRECT in the last 72 hours. No results for input(s): TSH, T4TOTAL, T3FREE, THYROIDAB in the last 72 hours.  Invalid input(s): FREET3 Recent Labs    06/04/19 0344 06/05/19 0534  FERRITIN 232 210   Coags: No results for input(s): INR in the last 72 hours.  Invalid input(s): PT Microbiology: Recent Results (from the past 240 hour(s))  Blood Culture (routine x 2)     Status: None   Collection Time: 05/29/19 10:15 PM   Specimen: BLOOD  Result Value Ref Range Status   Specimen Description BLOOD RIGHT ANTECUBITAL  Final   Special Requests   Final    BOTTLES DRAWN AEROBIC AND ANAEROBIC Blood Culture adequate volume   Culture   Final    NO GROWTH 5 DAYS Performed at [facility], [address], [place], [place] [address]    Report Status 06/03/2019 FINAL  Final  Blood Culture (routine x 2)     Status: None   Collection Time: 05/30/19 12:26 AM   Specimen: BLOOD RIGHT ARM  Result Value Ref Range Status   Specimen Description BLOOD RIGHT ARM  Final   Special Requests   Final    BOTTLES DRAWN AEROBIC AND ANAEROBIC Blood Culture adequate volume   Culture   Final    NO GROWTH 5 DAYS Performed at [facility], [place] [address], [place], [place] [address]    Report Status 06/04/2019 FINAL  Final    FURTHER DISCHARGE INSTRUCTIONS:  Get Medicines reviewed and adjusted: Please take all your medications with you for your next visit with your Primary MD  Laboratory/radiological data: Please request your Primary MD to go over all hospital tests and procedure/radiological results at the follow up, please ask your Primary MD to  get all Hospital records sent to his/her office.  In some cases, they will be blood work, cultures  and biopsy results pending at the time of your discharge. Please request that your primary care M.D. goes through all the records of your hospital data and follows up on these results.  Also Note the following: If you experience worsening of your admission symptoms, develop shortness of breath, life threatening emergency, suicidal or homicidal thoughts you must seek medical attention immediately by calling 911 or calling your MD immediately  if symptoms less severe.  You must read complete instructions/literature along with all the possible adverse reactions/side effects for all the Medicines you take and that have been prescribed to you. Take any new Medicines after you have completely understood and accpet all the possible adverse reactions/side effects.   Do not drive when taking Pain medications or sleeping medications (Benzodaizepines)  Do not take more than prescribed Pain, Sleep and Anxiety Medications. It is not advisable to combine anxiety,sleep and pain medications without talking with your primary care practitioner  Special Instructions: If you have smoked or chewed Tobacco  in the last 2 yrs please stop smoking, stop any regular Alcohol  and or any Recreational drug use.  Wear Seat belts while driving.  Please note: You were cared for by a hospitalist during your hospital stay. Once you are discharged, your primary care physician will handle any further medical issues. Please note that NO REFILLS for any discharge medications will be authorized once you are discharged, as it is imperative that you return to your primary care physician (or establish a relationship with a primary care physician if you do not have one) for your post hospital discharge needs so that they can reassess your need for medications and monitor your lab values.  Total Time spent coordinating discharge including counseling, education and face to face time equals 35 minutes.  SignedOren Binet 06/05/2019 10:35 AM

## 2019-06-05 NOTE — Evaluation (Signed)
Physical Therapy Evaluation and Discharge Patient Details Name: Amanda Murphy MRN: [id number] DOB: 09/02/1980 Today's Date: 06/05/2019   History of Present Illness  38 y.o. 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  Clinical Impression  Patient evaluated by Physical Therapy with no further acute PT needs identified. All education has been completed and the patient has no further questions.  PT is signing off. Thank you for this referral.     Follow Up Recommendations No PT follow up    Equipment Recommendations  None recommended by PT    Recommendations for Other Services       Precautions / Restrictions Precautions Precautions: None      Mobility  Bed Mobility                  Transfers Overall transfer level: Independent                  Ambulation/Gait Ambulation/Gait assistance: Independent Gait Distance (Feet): 400 Feet Assistive device: None Gait Pattern/deviations: WFL(Within Functional Limits)     General Gait Details: on room air; sats initial 200 ft incr to 100%; remaining 200 ft began to decline with lowest 90%; dyspnea 1/4  Stairs            Wheelchair Mobility    Modified Rankin (Stroke Patients Only)       Balance Overall balance assessment: Independent                                           Pertinent Vitals/Pain Pain Assessment: No/denies pain    Home Living Family/patient expects to be discharged to:: Private residence Living Arrangements: Alone Available Help at Discharge: Friend(s);Available PRN/intermittently Type of Home: House Home Access: Stairs to enter Entrance Stairs-Rails: Right;Left;Can reach both Entrance Stairs-Number of Steps:  5 Home Layout: One level Home Equipment: None      Prior Function Level of Independence: Independent         Comments: Nurse at [employer]        Extremity/Trunk Assessment   Upper Extremity Assessment Upper Extremity Assessment: Overall WFL for tasks assessed    Lower Extremity Assessment Lower Extremity Assessment: Overall WFL for tasks assessed    Cervical / Trunk Assessment Cervical / Trunk Assessment: Other exceptions Cervical / Trunk Exceptions: obesity  Communication   Communication: No difficulties  Cognition Arousal/Alertness: Awake/alert Behavior During Therapy: WFL for tasks assessed/performed Overall Cognitive Status: Within Functional Limits for tasks assessed                                        General Comments General comments (skin integrity, edema, etc.): Educated on fatigue associated with COVID and need to pace herself and her activities throughout her day. She reports MD has seen her this morning and is discharging her home. States he told her not to return to work for 3 weeks    Exercises     Assessment/Plan    PT Assessment Patent does not need any further PT services  PT Problem List  PT Treatment Interventions      PT Goals (Current goals can be found in the Care Plan section)  Acute Rehab PT Goals Patient Stated Goal: go home today PT Goal Formulation: All assessment and education complete, DC therapy    Frequency     Barriers to discharge        Co-evaluation               AM-PAC PT "6 Clicks" Mobility  Outcome Measure Help needed turning from your back to your side while in a flat bed without using bedrails?: None Help needed moving from lying on your back to sitting on the side of a flat bed without using bedrails?: None Help needed moving to and from a bed to a chair (including a wheelchair)?: None Help needed standing up from a chair using your arms (e.g.,  wheelchair or bedside chair)?: None Help needed to walk in hospital room?: None Help needed climbing 3-5 steps with a railing? : None 6 Click Score: 24    End of Session   Activity Tolerance: Patient tolerated treatment well Patient left: in chair;with call bell/phone within reach   PT Visit Diagnosis: Difficulty in walking, not elsewhere classified (R26.2)    Time: 0912-0927 PT Time Calculation (min) (ACUTE ONLY): 15 min   Charges:   PT Evaluation $PT Eval Low Complexity: 1 Low           Arby Barrette, PT Pager [phone]   Rexanne Mano 06/05/2019, 9:36 AM

## 2019-06-05 NOTE — Progress Notes (Signed)
Pt D/C'd home with sister. All belongings are in her bags or already brought home by family.

## 2019-06-05 NOTE — Progress Notes (Signed)
OT Cancellation Note  Patient Details Name: Amanda Murphy MRN: [id number] DOB: 05/30/1981   Cancelled Treatment:    Reason Eval/Treat Not Completed: OT screened, no needs identified, will sign off(Per PT, pt independent and on RA for activity. )  [place], OTR/L Acute Rehab Pager: [phone] Office: [phone] 06/05/2019, 12:45 PM

## 2019-06-20 ENCOUNTER — Encounter: Payer: Self-pay | Admitting: Internal Medicine

## 2019-06-20 ENCOUNTER — Ambulatory Visit (INDEPENDENT_AMBULATORY_CARE_PROVIDER_SITE_OTHER): Payer: PRIVATE HEALTH INSURANCE | Admitting: Internal Medicine

## 2019-06-20 DIAGNOSIS — J1282 Pneumonia due to coronavirus disease 2019: Secondary | ICD-10-CM | POA: Diagnosis not present

## 2019-06-20 DIAGNOSIS — U071 COVID-19: Secondary | ICD-10-CM | POA: Diagnosis not present

## 2019-06-20 DIAGNOSIS — J9601 Acute respiratory failure with hypoxia: Secondary | ICD-10-CM | POA: Diagnosis not present

## 2019-06-20 NOTE — Progress Notes (Signed)
Virtual Visit via Audio Note  I connected with Amanda Murphy on 06/20/19 at 10:00 AM EST by an audio-only enabled telemedicine application and verified that I am speaking with the correct person using two identifiers.  The patient and the provider were at separate locations throughout the entire encounter.   I discussed the limitations of evaluation and management by telemedicine and the availability of in person appointments. The patient expressed understanding and agreed to proceed. The patient and the provider were the only parties present for the visit unless noted in HPI below.  History of Present Illness: The patient is a 39 y.o. female with visit for hospital follow up for covid-19 pneumonia. Discharged from hospital on 06/05/19. Not currently having a cough. Is back to work as of yesterday. Denies headaches or sinus symptoms. Has recovered well. Denies symptoms currently. Has tried prednisone which she has finished.   Observations/Objective: Voice strong, no coughing or dyspnea  Assessment and Plan: See problem oriented charting  Follow Up Instructions: ordered follow up CXR for 2-4 weeks from now, home sleep test, labs next week with physical  Visit time 9 minutes in non-face to face communication with patient and coordination of care.  I discussed the assessment and treatment plan with the patient. The patient was provided an opportunity to ask questions and all were answered. The patient agreed with the plan and demonstrated an understanding of the instructions.   The patient was advised to call back or seek an in-person evaluation if the symptoms worsen or if the condition fails to improve as anticipated.  Hoyt Koch, MD

## 2019-06-20 NOTE — Assessment & Plan Note (Signed)
Ordered home sleep study and f/u CXR.

## 2019-06-20 NOTE — Assessment & Plan Note (Signed)
Ordered follow up CXR for 2-4 weeks from now.

## 2019-06-28 ENCOUNTER — Ambulatory Visit (INDEPENDENT_AMBULATORY_CARE_PROVIDER_SITE_OTHER): Payer: PRIVATE HEALTH INSURANCE

## 2019-06-28 ENCOUNTER — Encounter: Payer: Self-pay | Admitting: Internal Medicine

## 2019-06-28 ENCOUNTER — Other Ambulatory Visit: Payer: Self-pay

## 2019-06-28 ENCOUNTER — Ambulatory Visit (INDEPENDENT_AMBULATORY_CARE_PROVIDER_SITE_OTHER): Payer: PRIVATE HEALTH INSURANCE | Admitting: Internal Medicine

## 2019-06-28 VITALS — BP 128/86 | HR 99 | Temp 99.1°F | Ht 64.0 in | Wt 355.0 lb

## 2019-06-28 DIAGNOSIS — Z0000 Encounter for general adult medical examination without abnormal findings: Secondary | ICD-10-CM

## 2019-06-28 DIAGNOSIS — D496 Neoplasm of unspecified behavior of brain: Secondary | ICD-10-CM | POA: Diagnosis not present

## 2019-06-28 DIAGNOSIS — U071 COVID-19: Secondary | ICD-10-CM | POA: Diagnosis not present

## 2019-06-28 DIAGNOSIS — J1282 Pneumonia due to coronavirus disease 2019: Secondary | ICD-10-CM | POA: Diagnosis not present

## 2019-06-28 LAB — CBC
HCT: 43.6 % (ref 36.0–46.0)
Hemoglobin: 14.1 g/dL (ref 12.0–15.0)
MCHC: 32.4 g/dL (ref 30.0–36.0)
MCV: 95 fl (ref 78.0–100.0)
Platelets: 181 10*3/uL (ref 150.0–400.0)
RBC: 4.59 Mil/uL (ref 3.87–5.11)
RDW: 17.9 % — ABNORMAL HIGH (ref 11.5–15.5)
WBC: 4.3 10*3/uL (ref 4.0–10.5)

## 2019-06-28 LAB — LIPID PANEL
Cholesterol: 218 mg/dL — ABNORMAL HIGH (ref 0–200)
HDL: 77.4 mg/dL (ref >39.00)
LDL Cholesterol: 118 mg/dL — ABNORMAL HIGH (ref 0–99)
NonHDL: 140.9
Total CHOL/HDL Ratio: 3
Triglycerides: 117 mg/dL (ref 0.0–149.0)
VLDL: 23.4 mg/dL (ref 0.0–40.0)

## 2019-06-28 LAB — COMPREHENSIVE METABOLIC PANEL
ALT: 60 U/L — ABNORMAL HIGH (ref 0–35)
AST: 33 U/L (ref 0–37)
Albumin: 4 g/dL (ref 3.5–5.2)
Alkaline Phosphatase: 55 U/L (ref 39–117)
BUN: 12 mg/dL (ref 6–23)
CO2: 29 mEq/L (ref 19–32)
Calcium: 9.5 mg/dL (ref 8.4–10.5)
Chloride: 102 mEq/L (ref 96–112)
Creatinine, Ser: 1 mg/dL (ref 0.40–1.20)
GFR: 74.29 mL/min (ref >60.00)
Glucose, Bld: 113 mg/dL — ABNORMAL HIGH (ref 70–99)
Potassium: 3.7 mEq/L (ref 3.5–5.1)
Sodium: 139 mEq/L (ref 135–145)
Total Bilirubin: 0.9 mg/dL (ref 0.2–1.2)
Total Protein: 6.4 g/dL (ref 6.0–8.3)

## 2019-06-28 LAB — HEMOGLOBIN A1C: Hgb A1c MFr Bld: 6.1 % (ref 4.6–6.5)

## 2019-06-28 NOTE — Assessment & Plan Note (Signed)
Counseled about weight reduction through diet and exercise.

## 2019-06-28 NOTE — Assessment & Plan Note (Signed)
Stable, s/p resection. No new or concerning symptoms such as headaches or vision changes.

## 2019-06-28 NOTE — Progress Notes (Signed)
   Subjective:   Patient ID: Amanda Murphy, female    DOB: 1981-04-22, 39 y.o.   MRN: [id number]  HPI The patient is a 39 YO female coming in for physical.   PMH, [facility], social history reviewed and updated.   Review of Systems  Constitutional: Negative.   HENT: Negative.   Eyes: Negative.   Respiratory: Negative for cough, chest tightness and shortness of breath.   Cardiovascular: Negative for chest pain, palpitations and leg swelling.  Gastrointestinal: Negative for abdominal distention, abdominal pain, constipation, diarrhea, nausea and vomiting.  Musculoskeletal: Negative.   Skin: Negative.   Neurological: Negative.   Psychiatric/Behavioral: Negative.     Objective:  Physical Exam Constitutional:      Appearance: She is well-developed. She is obese.  HENT:     Head: Normocephalic and atraumatic.  Cardiovascular:     Rate and Rhythm: Normal rate and regular rhythm.  Pulmonary:     Effort: Pulmonary effort is normal. No respiratory distress.     Breath sounds: Normal breath sounds. No wheezing or rales.  Abdominal:     General: Bowel sounds are normal. There is no distension.     Palpations: Abdomen is soft.     Tenderness: There is no abdominal tenderness. There is no rebound.  Musculoskeletal:     Cervical back: Normal range of motion.  Skin:    General: Skin is warm and dry.  Neurological:     Mental Status: She is alert and oriented to person, place, and time.     Coordination: Coordination normal.     Vitals:   06/28/19 0955  BP: 128/86  Pulse: 99  Temp: 99.1 F (37.3 C)  TempSrc: Oral  SpO2: 96%  Weight: (!) 355 lb (161 kg)  Height: 5\' 4"  (1.626 m)    This visit occurred during the SARS-CoV-2 public health emergency.  Safety protocols were in place, including screening questions prior to the visit, additional usage of staff PPE, and extensive cleaning of exam room while observing appropriate contact time as indicated for disinfecting solutions.    Assessment & Plan:

## 2019-06-28 NOTE — Patient Instructions (Signed)
We will do the labs today and get the chest x-ray.   Health Maintenance, Female Adopting a healthy lifestyle and getting preventive care are important in promoting health and wellness. Ask your health care provider about:  The right schedule for you to have regular tests and exams.  Things you can do on your own to prevent diseases and keep yourself healthy. What should I know about diet, weight, and exercise? Eat a healthy diet   Eat a diet that includes plenty of vegetables, fruits, low-fat dairy products, and lean protein.  Do not eat a lot of foods that are high in solid fats, added sugars, or sodium. Maintain a healthy weight Body mass index (BMI) is used to identify weight problems. It estimates body fat based on height and weight. Your health care provider can help determine your BMI and help you achieve or maintain a healthy weight. Get regular exercise Get regular exercise. This is one of the most important things you can do for your health. Most adults should:  Exercise for at least 150 minutes each week. The exercise should increase your heart rate and make you sweat (moderate-intensity exercise).  Do strengthening exercises at least twice a week. This is in addition to the moderate-intensity exercise.  Spend less time sitting. Even light physical activity can be beneficial. Watch cholesterol and blood lipids Have your blood tested for lipids and cholesterol at 40 years of age, then have this test every 5 years. Have your cholesterol levels checked more often if:  Your lipid or cholesterol levels are high.  You are older than 40 years of age.  You are at high risk for heart disease. What should I know about cancer screening? Depending on your health history and family history, you may need to have cancer screening at various ages. This may include screening for:  Breast cancer.  Cervical cancer.  Colorectal cancer.  Skin cancer.  Lung cancer. What should I  know about heart disease, diabetes, and high blood pressure? Blood pressure and heart disease  High blood pressure causes heart disease and increases the risk of stroke. This is more likely to develop in people who have high blood pressure readings, are of African descent, or are overweight.  Have your blood pressure checked: ? Every 3-5 years if you are 85-65 years of age. ? Every year if you are 35 years old or older. Diabetes Have regular diabetes screenings. This checks your fasting blood sugar level. Have the screening done:  Once every three years after age 52 if you are at a normal weight and have a low risk for diabetes.  More often and at a younger age if you are overweight or have a high risk for diabetes. What should I know about preventing infection? Hepatitis B If you have a higher risk for hepatitis B, you should be screened for this virus. Talk with your health care provider to find out if you are at risk for hepatitis B infection. Hepatitis C Testing is recommended for:  Everyone born from [birth year] through 1966.  Anyone with known risk factors for hepatitis C. Sexually transmitted infections (STIs)  Get screened for STIs, including gonorrhea and chlamydia, if: ? You are sexually active and are younger than 40 years of age. ? You are older than 40 years of age and your health care provider tells you that you are at risk for this type of infection. ? Your sexual activity has changed since you were last screened, and you are  at increased risk for chlamydia or gonorrhea. Ask your health care provider if you are at risk.  Ask your health care provider about whether you are at high risk for HIV. Your health care provider may recommend a prescription medicine to help prevent HIV infection. If you choose to take medicine to prevent HIV, you should first get tested for HIV. You should then be tested every 3 months for as long as you are taking the medicine. Pregnancy  If you are  about to stop having your period (premenopausal) and you may become pregnant, seek counseling before you get pregnant.  Take 400 to 800 micrograms (mcg) of folic acid every day if you become pregnant.  Ask for birth control (contraception) if you want to prevent pregnancy. Osteoporosis and menopause Osteoporosis is a disease in which the bones lose minerals and strength with aging. This can result in bone fractures. If you are 76 years old or older, or if you are at risk for osteoporosis and fractures, ask your health care provider if you should:  Be screened for bone loss.  Take a calcium or vitamin D supplement to lower your risk of fractures.  Be given hormone replacement therapy (HRT) to treat symptoms of menopause. Follow these instructions at home: Lifestyle  Do not use any products that contain nicotine or tobacco, such as cigarettes, e-cigarettes, and chewing tobacco. If you need help quitting, ask your health care provider.  Do not use street drugs.  Do not share needles.  Ask your health care provider for help if you need support or information about quitting drugs. Alcohol use  Do not drink alcohol if: ? Your health care provider tells you not to drink. ? You are pregnant, may be pregnant, or are planning to become pregnant.  If you drink alcohol: ? Limit how much you use to 0-1 drink a day. ? Limit intake if you are breastfeeding.  Be aware of how much alcohol is in your drink. In the [place] one drink equals one 12 oz bottle of beer (355 mL), one 5 oz glass of wine (148 mL), or one 1 oz glass of hard liquor (44 mL). General instructions  Schedule regular health, dental, and eye exams.  Stay current with your vaccines.  Tell your health care provider if: ? You often feel depressed. ? You have ever been abused or do not feel safe at home. Summary  Adopting a healthy lifestyle and getting preventive care are important in promoting health and wellness.  Follow  your health care provider's instructions about healthy diet, exercising, and getting tested or screened for diseases.  Follow your health care provider's instructions on monitoring your cholesterol and blood pressure. This information is not intended to replace advice given to you by your health care provider. Make sure you discuss any questions you have with your health care provider. Document Revised: 05/18/2018 Document Reviewed: 05/18/2018 Elsevier Patient Education  2020 [employer].

## 2019-06-28 NOTE — Assessment & Plan Note (Signed)
Flu shot up to date. Tetanus up to date. Pap smear up to date with gyn. Counseled about sun safety and mole surveillance. Counseled about the dangers of distracted driving. Given 10 year screening recommendations.   

## 2019-09-16 ENCOUNTER — Ambulatory Visit: Payer: 59 | Attending: Internal Medicine

## 2019-09-16 DIAGNOSIS — Z23 Encounter for immunization: Secondary | ICD-10-CM

## 2019-09-16 NOTE — Progress Notes (Signed)
   Covid-19 Vaccination Clinic  Name:  Amanda Murphy    MRN: [id number] DOB: [birth year]  09/16/2019  Amanda Murphy was observed post Covid-19 immunization for 15 minutes without incident. She was provided with Vaccine Information Sheet and instruction to access the V-Safe system.   Amanda Murphy was instructed to call 911 with any severe reactions post vaccine: [name] Difficulty breathing  . Swelling of face and throat  . A fast heartbeat  . A bad rash all over body  . Dizziness and weakness   Immunizations Administered    Name Date Dose VIS Date Route   Pfizer COVID-19 Vaccine 09/16/2019  5:07 PM 0.3 mL 05/19/2019 Intramuscular   Manufacturer: [place]   Lot: [id number]   [place]: [id number]

## 2019-10-10 ENCOUNTER — Ambulatory Visit: Payer: 59 | Attending: Internal Medicine

## 2019-10-10 DIAGNOSIS — Z23 Encounter for immunization: Secondary | ICD-10-CM

## 2019-10-10 NOTE — Progress Notes (Signed)
   Covid-19 Vaccination Clinic  Name:  Amanda Murphy    MRN: [id number] DOB: 08-Jul-1980  10/10/2019  [name] was observed post Covid-19 immunization for 15 minutes without incident. She was provided with Vaccine Information Sheet and instruction to access the V-Safe system.   [name] was instructed to call 911 with any severe reactions post vaccine: [name] Difficulty breathing  . Swelling of face and throat  . A fast heartbeat  . A bad rash all over body  . Dizziness and weakness   Immunizations Administered    Name Date Dose VIS Date Route   Pfizer COVID-19 Vaccine 10/10/2019  9:35 AM 0.3 mL 08/02/2018 Intramuscular   Manufacturer: [place]   Lot: [id number]   [place]: [id number]

## 2020-01-29 ENCOUNTER — Encounter (HOSPITAL_COMMUNITY): Payer: Self-pay

## 2020-01-29 ENCOUNTER — Ambulatory Visit (HOSPITAL_COMMUNITY)
Admission: RE | Admit: 2020-01-29 | Discharge: 2020-01-29 | Disposition: A | Discharge status: Home / Self Care | Payer: PRIVATE HEALTH INSURANCE | Source: Ambulatory Visit | Attending: Physician Assistant | Admitting: Physician Assistant

## 2020-01-29 ENCOUNTER — Other Ambulatory Visit: Payer: Self-pay

## 2020-01-29 VITALS — BP 137/81 | HR 103 | Temp 98.3°F | Resp 20

## 2020-01-29 DIAGNOSIS — L02419 Cutaneous abscess of limb, unspecified: Secondary | ICD-10-CM | POA: Diagnosis not present

## 2020-01-29 DIAGNOSIS — L03119 Cellulitis of unspecified part of limb: Secondary | ICD-10-CM | POA: Diagnosis not present

## 2020-01-29 MED ORDER — SULFAMETHOXAZOLE-TRIMETHOPRIM 800-160 MG PO TABS: 1.0000 | ORAL_TABLET | Freq: Two times a day (BID) | ORAL | 0 refills | Status: AC

## 2020-01-29 MED ORDER — LIDOCAINE-EPINEPHRINE 1 %-1:100000 IJ SOLN
INTRAMUSCULAR | Status: AC
  Filled 2020-01-29: qty 1

## 2020-01-29 NOTE — ED Triage Notes (Signed)
Pt presents with cellulitis on right upper outer thigh area X 2 days.

## 2020-01-29 NOTE — Discharge Instructions (Signed)
Change bandages daily Wash with soap and water, bathe normally.  Apply gentle pressure around incision site.  Some drainage and bleeding for the next 24 hours is encouraged and expected.  The Bactrim as prescribed  If reaccumulation or no improvement after the next 48 to 72 hours please return or follow-up with your primary care

## 2020-01-29 NOTE — ED Provider Notes (Signed)
[place]    CSN: [id number] Arrival date & time: 01/29/20  1608      History   Chief Complaint Chief Complaint  Patient presents with  . Appointment  . Recurrent Skin Infections    HPI Amanda Murphy is a 39 y.o. female.   Patient presents for concern of cellulitis or abscess on her right upper thigh.  She reports is been developing over the last several days.  She has never had an infection here before.  She reports there was a bump that is grown.  She reports it is quite painful.     Past Medical History:  Diagnosis Date  . Abnormal uterine bleeding (AUB)   . Anemia   . Anxiety   . Asthma    as child - no inhaler - no problem as adult  . Astigmatism   . Fibroids   . H/O diabetes insipidus    s/p craniotomy  . Headache(784.0)    hx - none since surgery  . Hx of blood clots 2016   P. E.  p craniotomy. on Xarelto until 2017.  . Joint pain    ankles and hips  . PCOS (polycystic ovarian syndrome)   . Pneumonia    Hx 3-4 yrs ago   . Pre-diabetes   . Pulmonary embolus ([place])   . Seasonal allergies   . Vision abnormalities   . Vitamin D deficiency   . Wears glasses     Patient Active Problem List   Diagnosis Date Noted  . Iron deficiency anemia 01/11/2017  . Insulin resistance 01/11/2017  . Vitamin D deficiency 06/30/2016  . Routine general medical examination at a health care facility 09/27/2015  . PE (pulmonary embolism)   . Brain tumor ([place]) 10/31/2014  . Situational anxiety 10/19/2014  . Allergic rhinitis 08/16/2014  . 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 Morbid obesity ([place]) 01/18/2014    Past Surgical History:  Procedure Laterality Date  . CRANIOTOMY N/A 10/31/2014   Procedure: CRANIOTOMY FOR RESECTION OF TUMOR;  Surgeon: Jovita Gamma, MD;  Location: [facility];  Service: Neurosurgery;  Laterality: N/A;  Craniotomy for resection of tumor  . DILATATION & CURETTAGE/HYSTEROSCOPY WITH MYOSURE N/A 11/12/2016   Procedure: DILATATION &  CURETTAGE/HYSTEROSCOPY WITH MYOSURE;  Surgeon: Marylynn Pearson, MD;  Location: [facility];  Service: Gynecology;  Laterality: N/A;  . DILATION AND CURETTAGE OF UTERUS     myomectomy  . MYOMECTOMY     vaginal -   . WISDOM TOOTH EXTRACTION  2006    OB History   No obstetric history on file.      Home Medications    Prior to Admission medications   Medication Sig Start Date End Date Taking? Authorizing Provider  acetaminophen (TYLENOL) 500 MG tablet Take 1,000 mg by mouth every 6 (six) hours as needed for mild pain.    [provider]  aspirin EC 325 MG tablet Take 325 mg by mouth daily.    [provider]  ELDERBERRY PO Take by mouth.    [provider]  levonorgestrel (MIRENA) 20 MCG/24HR IUD 1 each by Intrauterine route once.    [provider]  Multiple Vitamin (MULTIVITAMIN WITH MINERALS) TABS tablet Take 1 tablet by mouth daily.    [provider]  Multiple Vitamins-Minerals (ZINC PO) Take by mouth.    [provider]  sulfamethoxazole-trimethoprim (BACTRIM DS) 800-160 MG tablet Take 1 tablet by mouth 2 (two) times daily for 7 days. 01/29/20 02/05/20  Isac Lincks, Edison Nasuti  E, PA-C    Family History Family History  Problem Relation Age of Onset  . Hypertension Mother   . Diabetes Mother   . Obesity Mother   . Ovarian cancer Other   . Breast cancer Other   . Fibroids Other     Social History Social History   Tobacco Use  . Smoking status: Never Smoker  . Smokeless tobacco: Never Used  Vaping Use  . Vaping Use: Never used  Substance Use Topics  . Alcohol use: Yes    Alcohol/week: 0.0 standard drinks    Comment: occasional wine  . Drug use: No     Allergies   Patient has no known allergies.   Review of Systems Review of Systems   Physical Exam Triage Vital Signs ED Triage Vitals  Enc Vitals Group     BP 01/29/20 1703 137/81     Pulse Rate 01/29/20 1703 (!) 103     Resp 01/29/20 1703 20     Temp 01/29/20 1703  98.3 F (36.8 C)     Temp Source 01/29/20 1703 Oral     SpO2 01/29/20 1703 100 %     Weight --      Height --      Head Circumference --      Peak Flow --      Pain Score 01/29/20 1702 8     Pain Loc --      Pain Edu? --      Excl. in [place]? --    No data found.  Updated Vital Signs BP 137/81 (BP Location: Left Arm)   Pulse (!) 103   Temp 98.3 F (36.8 C) (Oral)   Resp 20   SpO2 100%   Visual Acuity Right Eye Distance:   Left Eye Distance:   Bilateral Distance:    Right Eye Near:   Left Eye Near:    Bilateral Near:     Physical Exam Vitals and nursing note reviewed.  Constitutional:      Appearance: Normal appearance.  Cardiovascular:     Rate and Rhythm: Normal rate.  Pulmonary:     Effort: Pulmonary effort is normal. No respiratory distress.  Skin:    General: Skin is warm.          Comments: Proximately 8 cm area of induration and erythema consistent with cellulitis with a central increased area of induration with central fluctuance, approximately 2 cm in diameter.  Neurological:     Mental Status: She is alert.      UC Treatments / Results  Labs (all labs ordered are listed, but only abnormal results are displayed) Labs Reviewed - No data to display  EKG   Radiology No results found.  Procedures Incision and Drainage  Date/Time: 01/29/2020 5:52 PM Performed by: Purnell Shoemaker, PA-C Authorized by: Purnell Shoemaker, PA-C   Consent:    Consent obtained:  Verbal   Consent given by:  Patient   Risks discussed:  Incomplete drainage and pain   Alternatives discussed:  Alternative treatment Location:    Type:  Abscess   Size:  3 cm   Location:  Lower extremity   Lower extremity location: Right upper thigh. Pre-procedure details:    Skin preparation:  Antiseptic wash and Betadine Anesthesia (see MAR for exact dosages):    Anesthesia method:  Local infiltration   Local anesthetic:  Lidocaine 1% WITH epi Procedure type:    Complexity:   Simple Procedure details:    Needle aspiration:  no     Incision types:  Single straight   Scalpel blade:  11   Wound management:  Probed and deloculated   Drainage:  Bloody and purulent   Drainage amount:  Scant   Wound treatment:  Wound left open   Packing materials:  None Post-procedure details:    Patient tolerance of procedure:  Tolerated well, no immediate complications   (including critical care time)  Medications Ordered in UC Medications - No data to display  Initial Impression / Assessment and Plan / UC Course  I have reviewed the triage vital signs and the nursing notes.  Pertinent labs & imaging results that were available during my care of the patient were reviewed by me and considered in my medical decision making (see chart for details).     #Cellulitis and abscess of right upper thigh. Patient is a 39 year old presenting with abscess with surrounding cellulitis of the right upper leg.  Incision and drainage yielded purulent discharge.  Will place on Bactrim.  Discussed wound care.  Discussed return and fall precautions.  Patient verbalized understanding plan of care. Final Clinical Impressions(s) / UC Diagnoses   Final diagnoses:  Cellulitis and abscess of lower extremity     Discharge Instructions     Change bandages daily Wash with soap and water, bathe normally.  Apply gentle pressure around incision site.  Some drainage and bleeding for the next 24 hours is encouraged and expected.  The Bactrim as prescribed  If reaccumulation or no improvement after the next 48 to 72 hours please return or follow-up with your primary care      ED Prescriptions    Medication Sig Dispense Auth. Provider   sulfamethoxazole-trimethoprim (BACTRIM DS) 800-160 MG tablet Take 1 tablet by mouth 2 (two) times daily for 7 days. 14 tablet Anthonia Monger, Marguerita Beards, PA-C     PDMP not reviewed this encounter.   Purnell Shoemaker, PA-C 01/30/20 [id number]

## 2020-04-22 ENCOUNTER — Encounter: Payer: Self-pay | Admitting: Internal Medicine

## 2020-04-22 ENCOUNTER — Other Ambulatory Visit: Payer: Self-pay

## 2020-04-22 ENCOUNTER — Ambulatory Visit: Payer: 59 | Admitting: Internal Medicine

## 2020-04-22 DIAGNOSIS — L0291 Cutaneous abscess, unspecified: Secondary | ICD-10-CM | POA: Diagnosis not present

## 2020-04-22 NOTE — Patient Instructions (Signed)
I would give this another couple of weeks and see how it is doing.

## 2020-04-22 NOTE — Progress Notes (Signed)
   Subjective:   Patient ID: Amanda Murphy, female    DOB: 1980-08-16, 39 y.o.   MRN: [id number]  HPI The patient is a 39 YO female coming in for abscess on left posterior neck. Showed up about 2-3 weeks ago. Started getting larger and painful. Then she had spontaneous drainage within the last week. It is still draining so she wanted to have this evaluated. She is not having much discomfort at this time. Denies fevers or chills.   Review of Systems  Constitutional: Negative.   HENT: Negative.   Eyes: Negative.   Respiratory: Negative for cough, chest tightness and shortness of breath.   Cardiovascular: Negative for chest pain, palpitations and leg swelling.  Gastrointestinal: Negative for abdominal distention, abdominal pain, constipation, diarrhea, nausea and vomiting.  Musculoskeletal: Negative.   Skin: Positive for wound.  Neurological: Negative.   Psychiatric/Behavioral: Negative.     Objective:  Physical Exam Constitutional:      Appearance: She is well-developed.  HENT:     Head: Normocephalic and atraumatic.  Cardiovascular:     Rate and Rhythm: Normal rate and regular rhythm.  Pulmonary:     Effort: Pulmonary effort is normal. No respiratory distress.     Breath sounds: Normal breath sounds. No wheezing or rales.  Abdominal:     General: Bowel sounds are normal. There is no distension.     Palpations: Abdomen is soft.     Tenderness: There is no abdominal tenderness. There is no rebound.  Musculoskeletal:     Cervical back: Normal range of motion.     Comments: Posterior left neck there are two discrete hard abscesses without fluctuance or pus expressible without overlying tenderness or redness  Skin:    General: Skin is warm and dry.  Neurological:     Mental Status: She is alert and oriented to person, place, and time.     Coordination: Coordination normal.     Vitals:   04/22/20 1553  BP: 122/72  Pulse: 96  Temp: 99.1 F (37.3 C)  TempSrc: Oral  SpO2:  99%  Weight: (!) 363 lb (164.7 kg)  Height: 5\' 4"  (1.626 m)    This visit occurred during the SARS-CoV-2 public health emergency.  Safety protocols were in place, including screening questions prior to the visit, additional usage of staff PPE, and extensive cleaning of exam room while observing appropriate contact time as indicated for disinfecting solutions.   Assessment & Plan:  Visit time 15 minutes in face to face communication with patient and coordination of care, additional 5 minutes spent in record review, coordination or care, ordering tests, communicating/referring to other healthcare professionals, documenting in medical records all on the same day of the visit for total time 20 minutes spent on the visit.

## 2020-04-23 NOTE — Assessment & Plan Note (Signed)
Is draining spontaneously so no I and D required today. Advised we could refer for removal or monitor for resolution. She would like to monitor closely for next several weeks to see if this will resolve or recur.

## 2020-10-25 ENCOUNTER — Ambulatory Visit: Payer: 59 | Admitting: Internal Medicine

## 2020-10-25 ENCOUNTER — Other Ambulatory Visit: Payer: Self-pay

## 2020-10-25 ENCOUNTER — Encounter: Payer: Self-pay | Admitting: Internal Medicine

## 2020-10-25 DIAGNOSIS — L02419 Cutaneous abscess of limb, unspecified: Secondary | ICD-10-CM | POA: Diagnosis not present

## 2020-10-25 DIAGNOSIS — M79622 Pain in left upper arm: Secondary | ICD-10-CM | POA: Diagnosis not present

## 2020-10-25 MED ORDER — CEPHALEXIN 500 MG PO CAPS: 500.0000 mg | ORAL_CAPSULE | Freq: Two times a day (BID) | ORAL | 0 refills | Status: AC

## 2020-10-25 NOTE — Assessment & Plan Note (Signed)
Abscess drained by I and D however several surrounding pockets not amenable to I and D needs rx keflex 1 week. Advised on post-procedure wound care and indications to return or seek care.

## 2020-10-25 NOTE — Progress Notes (Signed)
Incision and Drainage Procedure Note  Pre-operative Diagnosis: left axillary abscess  Post-operative Diagnosis: same  Indications: pain, swelling  Anesthesia: 1% plain lidocaine  Procedure Details  The procedure, risks and complications have been discussed in detail (including, but not limited to airway compromise, infection, bleeding) with the patient, and the patient has signed consent to the procedure.  The skin was sterilely prepped and draped over the affected area in the usual fashion. After adequate local anesthesia, I&D with a #15 blade was performed on the left axillary region. Purulent drainage: present The patient was observed until stable.  Findings: pus  EBL: 5 cc's  Drains: none  Condition: Tolerated procedure well and Stable   Complications: none.

## 2020-10-25 NOTE — Progress Notes (Signed)
   Subjective:   Patient ID: Amanda Murphy, female    DOB: 17-Jan-1981, 40 y.o.   MRN: [id number]  HPI The patient is a 40 YO female coming in for left axillary abscess, started Monday and growing. Denies fevers or chills. Pain and swelling wants to have drained if possible.   Review of Systems  Constitutional: Negative.   HENT: Negative.   Eyes: Negative.   Respiratory: Negative for cough, chest tightness and shortness of breath.   Cardiovascular: Negative for chest pain, palpitations and leg swelling.  Gastrointestinal: Negative for abdominal distention, abdominal pain, constipation, diarrhea, nausea and vomiting.  Musculoskeletal: Negative.   Skin: Positive for wound.  Neurological: Negative.   Psychiatric/Behavioral: Negative.     Objective:  Physical Exam Constitutional:      Appearance: She is well-developed. She is obese.  HENT:     Head: Normocephalic and atraumatic.  Cardiovascular:     Rate and Rhythm: Normal rate and regular rhythm.  Pulmonary:     Effort: Pulmonary effort is normal. No respiratory distress.     Breath sounds: Normal breath sounds. No wheezing or rales.  Abdominal:     General: Bowel sounds are normal. There is no distension.     Palpations: Abdomen is soft.     Tenderness: There is no abdominal tenderness. There is no rebound.  Musculoskeletal:     Cervical back: Normal range of motion.  Skin:    General: Skin is warm and dry.     Comments: Left axillary abscess about 2-3 cm by 1 cm oval without spontaneous drainage, fluctuance present  Neurological:     Mental Status: She is alert and oriented to person, place, and time.     Coordination: Coordination normal.     Vitals:   10/25/20 0926  BP: 122/90  Pulse: (!) 104  Resp: 18  Temp: 98.4 F (36.9 C)  TempSrc: Oral  SpO2: 99%  Weight: (!) 372 lb 3.2 oz (168.8 kg)  Height: 5\' 4"  (1.626 m)    This visit occurred during the SARS-CoV-2 public health emergency.  Safety protocols were  in place, including screening questions prior to the visit, additional usage of staff PPE, and extensive cleaning of exam room while observing appropriate contact time as indicated for disinfecting solutions.   Assessment & Plan:

## 2020-10-25 NOTE — Patient Instructions (Addendum)
We have drained the area under the armpit and have sent in an antibiotic as well keflex to take 1 pill twice a day for 1 week.    Incision and Drainage, Care After This sheet gives you information about how to care for yourself after your procedure. Your health care provider may also give you more specific instructions. If you have problems or questions, contact your health care provider. What can I expect after the procedure? After the procedure, it is common to have:  Pain or discomfort around the incision site.  Blood, fluid, or pus (drainage) from the incision.  Redness and firm skin around the incision site. Follow these instructions at home: Medicines  Take over-the-counter and prescription medicines only as told by your health care provider.  If you were prescribed an antibiotic medicine, use or take it as told by your health care provider. Do not stop using the antibiotic even if you start to feel better. Wound care Follow instructions from your health care provider about how to take care of your wound. Make sure you:  Wash your hands with soap and water before and after you change your bandage (dressing). If soap and water are not available, use hand sanitizer.  Change your dressing and packing as told by your health care provider. ? If your dressing is dry or stuck when you try to remove it, moisten or wet the dressing with saline or water so that it can be removed without harming your skin or tissues. ? If your wound is packed, leave it in place until your health care provider tells you to remove it. To remove the packing, moisten or wet the packing with saline or water so that it can be removed without harming your skin or tissues.  Leave stitches (sutures), skin glue, or adhesive strips in place. These skin closures may need to stay in place for 2 weeks or longer. If adhesive strip edges start to loosen and curl up, you may trim the loose edges. Do not remove adhesive strips  completely unless your health care provider tells you to do that. Check your wound every day for signs of infection. Check for:  More redness, swelling, or pain.  More fluid or blood.  Warmth.  Pus or a bad smell. If you were sent home with a drain tube in place, follow instructions from your health care provider about:  How to empty it.  How to care for it at home.   General instructions  Rest the affected area.  Do not take baths, swim, or use a hot tub until your health care provider approves. Ask your health care provider if you may take showers. You may only be allowed to take sponge baths.  Return to your normal activities as told by your health care provider. Ask your health care provider what activities are safe for you. Your health care provider may put you on activity or lifting restrictions.  The incision will continue to drain. It is normal to have some clear or slightly bloody drainage. The amount of drainage should lessen each day.  Do not apply any creams, ointments, or liquids unless you have been told to by your health care provider.  Keep all follow-up visits as told by your health care provider. This is important. Contact a health care provider if:  Your cyst or abscess returns.  You have a fever or chills.  You have more redness, swelling, or pain around your incision.  You have more fluid  or blood coming from your incision.  Your incision feels warm to the touch.  You have pus or a bad smell coming from your incision.  You have red streaks above or below the incision site. Get help right away if:  You have severe pain or bleeding.  You cannot eat or drink without vomiting.  You have decreased urine output.  You become short of breath.  You have chest pain.  You cough up blood.  The affected area becomes numb or starts to tingle. These symptoms may represent a serious problem that is an emergency. Do not wait to see if the symptoms will go  away. Get medical help right away. Call your local emergency services (911 in the [place] Do not drive yourself to the hospital. Summary  After this procedure, it is common to have fluid, blood, or pus coming from the surgery site.  Follow all home care instructions. You will be told how to take care of your incision, how to check for infection, and how to take medicines.  If you were prescribed an antibiotic medicine, take it as told by your health care provider. Do not stop taking the antibiotic even if you start to feel better.  Contact a health care provider if you have increased redness, swelling, or pain around your incision. Get help right away if you have chest pain, you vomit, you cough up blood, or you have shortness of breath.  Keep all follow-up visits as told by your health care provider. This is important. This information is not intended to replace advice given to you by your health care provider. Make sure you discuss any questions you have with your health care provider. Document Revised: 04/25/2018 Document Reviewed: 04/25/2018 Elsevier Patient Education  2021 [employer].

## 2020-11-11 ENCOUNTER — Ambulatory Visit: Payer: 59

## 2020-11-11 NOTE — Progress Notes (Signed)
   Covid-19 Vaccination Clinic  Name:  Amanda Murphy    MRN: [id number] DOB: Jun 27, 1980  11/11/2020  Amanda Murphy was observed post Covid-19 immunization for 15 minutes without incident. She was provided with Vaccine Information Sheet and instruction to access the V-Safe system.   Amanda Murphy was instructed to call 911 with any severe reactions post vaccine: [name] Difficulty breathing  . Swelling of face and throat  . A fast heartbeat  . A bad rash all over body  . Dizziness and weakness

## 2020-11-12 ENCOUNTER — Other Ambulatory Visit (HOSPITAL_BASED_OUTPATIENT_CLINIC_OR_DEPARTMENT_OTHER): Payer: Self-pay

## 2020-11-12 MED ORDER — PFIZER-BIONT COVID-19 VAC-TRIS 30 MCG/0.3ML IM SUSP
INTRAMUSCULAR | 0 refills | Status: DC
Start: 2020-11-11 — End: 2021-04-29
  Filled 2020-11-12: qty 0.3, 1d supply, fill #0

## 2021-02-27 ENCOUNTER — Ambulatory Visit
Admission: EM | Admit: 2021-02-27 | Discharge: 2021-02-27 | Disposition: A | Discharge status: Home / Self Care | Payer: 59 | Attending: Urgent Care | Admitting: Urgent Care

## 2021-02-27 ENCOUNTER — Encounter: Payer: Self-pay | Admitting: Emergency Medicine

## 2021-02-27 ENCOUNTER — Other Ambulatory Visit: Payer: Self-pay

## 2021-02-27 DIAGNOSIS — L02415 Cutaneous abscess of right lower limb: Secondary | ICD-10-CM

## 2021-02-27 MED ORDER — SULFAMETHOXAZOLE-TRIMETHOPRIM 800-160 MG PO TABS: 1.0000 | ORAL_TABLET | Freq: Two times a day (BID) | ORAL | 0 refills | Status: DC

## 2021-02-27 NOTE — ED Triage Notes (Signed)
Patient c/o "an area" in the fold of her right leg that she noticed on Sunday.  The area has since gotten somewhat larger.  The area is itching.  Patient denies applying anything to the area.

## 2021-02-27 NOTE — Discharge Instructions (Addendum)
Please change your dressing 3-5 times daily. Do not apply any ointments or creams. Each time you change your dressing, make sure that you are pressing on the wound to get pus to come out.  Try your best to have a family member help you clean gently around the perimeter of the wound with gentle soap and warm water. Pat your wound dry and let it air out if possible to make sure it is dry before reapplying another dressing.

## 2021-02-27 NOTE — ED Provider Notes (Signed)
[place]   MRN: [id number] DOB: 03/23/1981  Subjective:   Amanda Murphy is a 40 y.o. female presenting for 5-day history of acute onset persistent rash over the right upper thigh.  Patient states that there has been pain that is radiating up from that area toward her hip.  Has not noted any particular drainage of pus or bleeding.  No fevers, nausea, vomiting.  No current facility-administered medications for this encounter.  Current Outpatient Medications:    acetaminophen (TYLENOL) 500 MG tablet, Take 1,000 mg by mouth every 6 (six) hours as needed for mild pain., Disp: , Rfl:    aspirin EC 325 MG tablet, Take 325 mg by mouth daily., Disp: , Rfl:    ELDERBERRY PO, Take by mouth., Disp: , Rfl:    levonorgestrel (MIRENA) 20 MCG/24HR IUD, 1 each by Intrauterine route once., Disp: , Rfl:    Multiple Vitamin (MULTIVITAMIN WITH MINERALS) TABS tablet, Take 1 tablet by mouth daily., Disp: , Rfl:    COVID-19 mRNA Vac-TriS, Pfizer, (PFIZER-BIONT COVID-19 VAC-TRIS) SUSP injection, Inject into the muscle., Disp: 0.3 mL, Rfl: 0   Multiple Vitamins-Minerals (ZINC PO), Take by mouth., Disp: , Rfl:    Allergies  Allergen Reactions   Metformin And Related Other (See Comments)    Blood sugar drops    Past Medical History:  Diagnosis Date   Abnormal uterine bleeding (AUB)    Anemia    Anxiety    Asthma    as child - no inhaler - no problem as adult   Astigmatism    Fibroids    H/O diabetes insipidus    s/p craniotomy   Headache(784.0)    hx - none since surgery   Hx of blood clots 2016   P. E.  p craniotomy. on Xarelto until 2017.   Joint pain    ankles and hips   PCOS (polycystic ovarian syndrome)    Pneumonia    Hx 3-4 yrs ago    Pre-diabetes    Pulmonary embolus (HCC)    Seasonal allergies    Vision abnormalities    Vitamin D deficiency    Wears glasses      Past Surgical History:  Procedure Laterality Date   CRANIOTOMY N/A 10/31/2014   Procedure:  CRANIOTOMY FOR RESECTION OF TUMOR;  Surgeon: Jovita Gamma, MD;  Location: [facility];  Service: Neurosurgery;  Laterality: N/A;  Craniotomy for resection of tumor   DILATATION & CURETTAGE/HYSTEROSCOPY WITH MYOSURE N/A 11/12/2016   Procedure: [place];  Surgeon: Marylynn Pearson, MD;  Location: [facility];  Service: Gynecology;  Laterality: N/A;   DILATION AND CURETTAGE OF UTERUS     myomectomy   MYOMECTOMY     vaginal -    WISDOM TOOTH EXTRACTION  2006    Family History  Problem Relation Age of Onset   Hypertension Mother    Diabetes Mother    Obesity Mother    Ovarian cancer Other    Breast cancer Other    Fibroids Other     Social History   Tobacco Use   Smoking status: Never   Smokeless tobacco: Never  Vaping Use   Vaping Use: Never used  Substance Use Topics   Alcohol use: Yes    Alcohol/week: 0.0 standard drinks    Comment: occasional wine   Drug use: No    ROS   Objective:   Vitals: BP 139/84 (BP Location: Left Arm)   Pulse 90   Temp 98.3  F (36.8 C) (Oral)   Ht 5\' 4"  (1.626 m)   Wt (!) 365 lb (165.6 kg)   SpO2 94%   BMI 62.65 kg/m   Physical Exam Constitutional:      General: She is not in acute distress.    Appearance: Normal appearance. She is well-developed. She is not ill-appearing, toxic-appearing or diaphoretic.  HENT:     Head: Normocephalic and atraumatic.     Nose: Nose normal.     Mouth/Throat:     Mouth: Mucous membranes are moist.     Pharynx: Oropharynx is clear.  Eyes:     General: No scleral icterus.    Extraocular Movements: Extraocular movements intact.     Pupils: Pupils are equal, round, and reactive to light.  Cardiovascular:     Rate and Rhythm: Normal rate.  Pulmonary:     Effort: Pulmonary effort is normal.  Skin:    General: Skin is warm and dry.       Neurological:     General: No focal deficit present.     Mental Status: She is alert and oriented to person, place, and  time.  Psychiatric:        Mood and Affect: Mood normal.        Behavior: Behavior normal.    PROCEDURE NOTE: I&D of Abscess Verbal consent obtained. Local anesthesia with cold spray.  Site cleansed with Betadine.  Aspiration attempted but as soon as the 18-gauge needle may contact, the area started to drain.   ~2cc expressed consisting of a mixture of pus and serosanguinous fluid. Wound cavity was explored with curved hemostats and loculations loosened. Cleansed and dressed.   Assessment and Plan :   PDMP not reviewed this encounter.  1. Abscess of right thigh     Successful I&D performed.  Wound care reviewed.  Start Bactrim for the abscess, Tylenol for pain. Counseled patient on potential for adverse effects with medications prescribed/recommended today, ER and return-to-clinic precautions discussed, patient verbalized understanding.    Jaynee Eagles, PA-C 02/27/21 1708

## 2021-02-28 ENCOUNTER — Ambulatory Visit: Payer: 59

## 2021-04-28 ENCOUNTER — Ambulatory Visit: Payer: 59 | Admitting: Internal Medicine

## 2021-04-28 ENCOUNTER — Encounter: Payer: Self-pay | Admitting: Internal Medicine

## 2021-04-28 ENCOUNTER — Other Ambulatory Visit: Payer: Self-pay

## 2021-04-28 DIAGNOSIS — L0291 Cutaneous abscess, unspecified: Secondary | ICD-10-CM | POA: Diagnosis not present

## 2021-04-28 MED ORDER — SULFAMETHOXAZOLE-TRIMETHOPRIM 800-160 MG PO TABS: 1.0000 | ORAL_TABLET | Freq: Two times a day (BID) | ORAL | 0 refills | Status: DC

## 2021-04-28 NOTE — Patient Instructions (Addendum)
We have sent in bactrim to take 1 pill twice a day for 1 week to clear up this infection.

## 2021-04-28 NOTE — Progress Notes (Signed)
   Subjective:   Patient ID: Amanda Murphy, female    DOB: [birth year], 40 y.o.   MRN: [id number]  HPI The patient is a 40 YO female coming in for abscess forming on back. Started about 5-6 days ago.   Review of Systems  Constitutional: Negative.   HENT: Negative.    Eyes: Negative.   Respiratory:  Negative for cough, chest tightness and shortness of breath.   Cardiovascular:  Negative for chest pain, palpitations and leg swelling.  Gastrointestinal:  Negative for abdominal distention, abdominal pain, constipation, diarrhea, nausea and vomiting.  Musculoskeletal: Negative.   Skin:  Positive for wound.  Neurological: Negative.   Psychiatric/Behavioral: Negative.     Objective:  Physical Exam Constitutional:      Appearance: She is well-developed. She is obese.  HENT:     Head: Normocephalic and atraumatic.  Cardiovascular:     Rate and Rhythm: Normal rate and regular rhythm.  Pulmonary:     Effort: Pulmonary effort is normal. No respiratory distress.     Breath sounds: Normal breath sounds. No wheezing or rales.  Abdominal:     General: Bowel sounds are normal. There is no distension.     Palpations: Abdomen is soft.     Tenderness: There is no abdominal tenderness. There is no rebound.  Musculoskeletal:     Cervical back: Normal range of motion.  Skin:    General: Skin is warm and dry.     Comments: Forming abscess on the midback right sided with firmness to palpation, not amenable to I and D today.  Neurological:     Mental Status: She is alert and oriented to person, place, and time.     Coordination: Coordination normal.    Vitals:   04/28/21 0839  BP: 130/74  Pulse: (!) 107  Resp: 18  SpO2: 95%  Weight: (!) 378 lb 9.6 oz (171.7 kg)  Height: 5\' 4"  (1.626 m)    This visit occurred during the SARS-CoV-2 public health emergency.  Safety protocols were in place, including screening questions prior to the visit, additional usage of staff PPE, and extensive  cleaning of exam room while observing appropriate contact time as indicated for disinfecting solutions.   Assessment & Plan:

## 2021-04-29 ENCOUNTER — Encounter: Payer: Self-pay | Admitting: Internal Medicine

## 2021-04-29 NOTE — Assessment & Plan Note (Signed)
Rx bactrim 1 week for abscess developing on the back. Not amenable to I and D today given mostly firm on exam and not fluctuant.

## 2021-09-19 ENCOUNTER — Ambulatory Visit
Admission: EM | Admit: 2021-09-19 | Discharge: 2021-09-19 | Disposition: A | Discharge status: Home / Self Care | Payer: 59 | Attending: Family Medicine | Admitting: Family Medicine

## 2021-09-19 DIAGNOSIS — L03221 Cellulitis of neck: Secondary | ICD-10-CM

## 2021-09-19 MED ORDER — SULFAMETHOXAZOLE-TRIMETHOPRIM 800-160 MG PO TABS: 1.0000 | ORAL_TABLET | Freq: Two times a day (BID) | ORAL | 0 refills | Status: AC

## 2021-09-19 MED ORDER — IBUPROFEN 800 MG PO TABS: 800.0000 mg | ORAL_TABLET | Freq: Three times a day (TID) | ORAL | 0 refills | Status: AC | PRN

## 2021-09-19 NOTE — Discharge Instructions (Addendum)
Take sulfamethoxazole-trimethoprim 800-160--1 tablet twice daily for 7 days ? ?Take ibuprofen 800 mg--1 tab every 8 hours as needed for pain.  ? ?Warm compresses. Come back or be seen on urgent basis if enlarging or becoming more intensely painful ?

## 2021-09-19 NOTE — ED Provider Notes (Signed)
?[facility] ? ? ? ?CSN: [id number] ?Arrival date & time: 09/19/21  1853 ? ? ?  ? ?History   ?Chief Complaint ?Chief Complaint  ?Patient presents with  ? something under neck   ? ? ?HPI ?Amanda Murphy is a 41 y.o. female.  ? ?HPI ?Here for pain and swelling in her central anterior neck.  2 days ago she noted a little knot and some tenderness.  Now it is increased in size.  No fever or chills. ? ?Does not have a history of diabetes.  She states that she had a history of diabetes insipidus after some brain surgery. ? ?Past Medical History:  ?Diagnosis Date  ? Abnormal uterine bleeding (AUB)   ? Anemia   ? Anxiety   ? Asthma   ? as child - no inhaler - no problem as adult  ? Astigmatism   ? Fibroids   ? H/O diabetes insipidus   ? s/p craniotomy  ? Headache(784.0)   ? hx - none since surgery  ? Hx of blood clots 2016  ? P. E.  p craniotomy. on Xarelto until 2017.  ? Joint pain   ? ankles and hips  ? PCOS (polycystic ovarian syndrome)   ? Pneumonia   ? Hx 3-4 yrs ago   ? Pre-diabetes   ? Pulmonary embolus ([place])   ? Seasonal allergies   ? Vision abnormalities   ? Vitamin D deficiency   ? Wears glasses   ? ? ?Patient Active Problem List  ? Diagnosis Date Noted  ? Left axillary pain 10/25/2020  ? Abscess 12/17/2017  ? Iron deficiency anemia 01/11/2017  ? Insulin resistance 01/11/2017  ? Vitamin D deficiency 06/30/2016  ? Routine general medical examination at a health care facility 09/27/2015  ? PE (pulmonary embolism)   ? Brain tumor ([place]) 10/31/2014  ? Situational anxiety 10/19/2014  ? Allergic rhinitis 08/16/2014  ? 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 Morbid obesity ([place]) 01/18/2014  ? ? ?Past Surgical History:  ?Procedure Laterality Date  ? CRANIOTOMY N/A 10/31/2014  ? Procedure: CRANIOTOMY FOR RESECTION OF TUMOR;  Surgeon: Jovita Gamma, MD;  Location: [facility];  Service: Neurosurgery;  Laterality: N/A;  Craniotomy for resection of tumor  ? DILATATION & CURETTAGE/HYSTEROSCOPY WITH MYOSURE N/A 11/12/2016  ?  Procedure: [place];  Surgeon: Marylynn Pearson, MD;  Location: [facility];  Service: Gynecology;  Laterality: N/A;  ? DILATION AND CURETTAGE OF UTERUS    ? myomectomy  ? MYOMECTOMY    ? vaginal -   ? [place] EXTRACTION  2006  ? ? ?OB History   ?No obstetric history on file. ?  ? ? ? ?Home Medications   ? ?Prior to Admission medications   ?Medication Sig Start Date End Date Taking? Authorizing Provider  ?ibuprofen (ADVIL) 800 MG tablet Take 1 tablet (800 mg total) by mouth every 8 (eight) hours as needed (pain). 09/19/21  Yes Barrett Henle, MD  ?acetaminophen (TYLENOL) 500 MG tablet Take 1,000 mg by mouth every 6 (six) hours as needed for mild pain.    [provider]  ?ELDERBERRY PO Take by mouth.    [provider]  ?levonorgestrel (MIRENA) 20 MCG/24HR IUD 1 each by Intrauterine route once.    [provider]  ?Multiple Vitamin (MULTIVITAMIN WITH MINERALS) TABS tablet Take 1 tablet by mouth daily.    [provider]  ?Multiple Vitamins-Minerals (ZINC PO) Take by mouth.    [provider]  ?  sulfamethoxazole-trimethoprim (BACTRIM DS) 800-160 MG tablet Take 1 tablet by mouth 2 (two) times daily for 7 days. 09/19/21 09/26/21  Barrett Henle, MD  ? ? ?Family History ?Family History  ?Problem Relation Age of Onset  ? Hypertension Mother   ? Diabetes Mother   ? Obesity Mother   ? Ovarian cancer Other   ? Breast cancer Other   ? Fibroids Other   ? ? ?Social History ?Social History  ? ?Tobacco Use  ? Smoking status: Never  ? Smokeless tobacco: Never  ?Vaping Use  ? Vaping Use: Never used  ?Substance Use Topics  ? Alcohol use: Yes  ?  Alcohol/week: 0.0 standard drinks  ?  Comment: occasional wine  ? Drug use: No  ? ? ? ?Allergies   ?Metformin and related ? ? ?Review of Systems ?Review of Systems ? ? ?Physical Exam ?Triage Vital Signs ?ED Triage Vitals  ?Enc Vitals Group  ?   BP 09/19/21 1859 130/84  ?   Pulse Rate 09/19/21 1859 100   ?   Resp 09/19/21 1859 18  ?   Temp 09/19/21 1859 98.4 ?F (36.9 ?C)  ?   Temp Source 09/19/21 1859 Oral  ?   SpO2 09/19/21 1859 94 %  ?   Weight --   ?   Height --   ?   Head Circumference --   ?   Peak Flow --   ?   Pain Score 09/19/21 1900 0  ?   Pain Loc --   ?   Pain Edu? --   ?   Excl. in [place]? --   ? ?No data found. ? ?Updated Vital Signs ?BP 130/84 (BP Location: Right Arm)   Pulse 100   Temp 98.4 ?F (36.9 ?C) (Oral)   Resp 18   SpO2 94%  ? ?Visual Acuity ?Right Eye Distance:   ?Left Eye Distance:   ?Bilateral Distance:   ? ?Right Eye Near:   ?Left Eye Near:    ?Bilateral Near:    ? ?Physical Exam ?Vitals reviewed.  ?Constitutional:   ?   General: She is not in acute distress. ?   Appearance: She is not toxic-appearing.  ?HENT:  ?   Mouth/Throat:  ?   Mouth: Mucous membranes are moist.  ?Neck:  ?   Comments: There is a 4 cm diameter induration in her central anterior neck that is tender.  No fluctuance. ?Skin: ?   Coloration: Skin is not jaundiced or pale.  ?Neurological:  ?   Mental Status: She is alert and oriented to person, place, and time.  ?Psychiatric:     ?   Behavior: Behavior normal.  ? ? ? ?UC Treatments / Results  ?Labs ?(all labs ordered are listed, but only abnormal results are displayed) ?Labs Reviewed - No data to display ? ?EKG ? ? ?Radiology ?No results found. ? ?Procedures ?Procedures (including critical care time) ? ?Medications Ordered in UC ?Medications - No data to display ? ?Initial Impression / Assessment and Plan / UC Course  ?I have reviewed the triage vital signs and the nursing notes. ? ?Pertinent labs & imaging results that were available during my care of the patient were reviewed by me and considered in my medical decision making (see chart for details). ? ?  ? ?We will treat for cellulitis. Warnings given for worsening ?Final Clinical Impressions(s) / UC Diagnoses  ? ?Final diagnoses:  ?Cellulitis, neck  ? ? ? ?Discharge Instructions   ? ?  ?  Take  sulfamethoxazole-trimethoprim 800-160--1 tablet twice daily for 7 days ? ?Take ibuprofen 800 mg--1 tab every 8 hours as needed for pain.  ? ?Warm compresses. Come back or be seen on urgent basis if enlarging or becoming more intensely painful ? ? ? ? ?ED Prescriptions   ? ? Medication Sig Dispense Auth. Provider  ? sulfamethoxazole-trimethoprim (BACTRIM DS) 800-160 MG tablet Take 1 tablet by mouth 2 (two) times daily for 7 days. 14 tablet Deno Sida, Gwenlyn Perking, MD  ? ibuprofen (ADVIL) 800 MG tablet Take 1 tablet (800 mg total) by mouth every 8 (eight) hours as needed (pain). 21 tablet Windy Carina Gwenlyn Perking, MD  ? ?  ? ?PDMP not reviewed this encounter. ?  ?Barrett Henle, MD ?09/19/21 1917 ? ?

## 2021-09-19 NOTE — ED Triage Notes (Signed)
Pt c/o bump under neck that started as a small dot and now it's big and hurts. Concerned for ingrown hair. First noticed 2 days ago.  ?

## 2022-01-23 ENCOUNTER — Ambulatory Visit
Admission: EM | Admit: 2022-01-23 | Discharge: 2022-01-23 | Disposition: A | Discharge status: Home / Self Care | Payer: 59 | Attending: Physician Assistant | Admitting: Physician Assistant

## 2022-01-23 DIAGNOSIS — L0291 Cutaneous abscess, unspecified: Secondary | ICD-10-CM | POA: Diagnosis not present

## 2022-01-23 MED ORDER — DOXYCYCLINE HYCLATE 100 MG PO CAPS: 100.0000 mg | ORAL_CAPSULE | Freq: Two times a day (BID) | ORAL | 0 refills | Status: DC

## 2022-01-23 NOTE — ED Provider Notes (Signed)
[facility]    CSN: [id number] Arrival date & time: 01/23/22  1536      History   Chief Complaint Chief Complaint  Patient presents with   Abscess    HPI Amanda Murphy is a 41 y.o. female.   Patient here today for evaluation of an abscess to her right posterior neck she first noticed a few days ago.  She reports that she had similar to her left side and actually had dermatology remove given recurrence and they instructed her that she may end up having additional abscess to the right given suspected tract across neck.  She has not had fever.  She denies any nausea or vomiting.  The history is provided by the patient.    Past Medical History:  Diagnosis Date   Abnormal uterine bleeding (AUB)    Anemia    Anxiety    Asthma    as child - no inhaler - no problem as adult   Astigmatism    Fibroids    H/O diabetes insipidus    s/p craniotomy   Headache(784.0)    hx - none since surgery   Hx of blood clots 2016   P. E.  p craniotomy. on Xarelto until 2017.   Joint pain    ankles and hips   PCOS (polycystic ovarian syndrome)    Pneumonia    Hx 3-4 yrs ago    Pre-diabetes    Pulmonary embolus (HCC)    Seasonal allergies    Vision abnormalities    Vitamin D deficiency    Wears glasses     Patient Active Problem List   Diagnosis Date Noted   Left axillary pain 10/25/2020   Abscess 12/17/2017   Iron deficiency anemia 01/11/2017   Insulin resistance 01/11/2017   Vitamin D deficiency 06/30/2016   Routine general medical examination at a health care facility 09/27/2015   PE (pulmonary embolism)    Brain tumor ([place]) 10/31/2014   Situational anxiety 10/19/2014   Allergic rhinitis 08/16/2014   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   Morbid obesity ([place]) 01/18/2014    Past Surgical History:  Procedure Laterality Date   CRANIOTOMY N/A 10/31/2014   Procedure: CRANIOTOMY FOR RESECTION OF TUMOR;  Surgeon: Jovita Gamma, MD;  Location: [facility];  Service:  Neurosurgery;  Laterality: N/A;  Craniotomy for resection of tumor   DILATATION & CURETTAGE/HYSTEROSCOPY WITH MYOSURE N/A 11/12/2016   Procedure: [place];  Surgeon: Marylynn Pearson, MD;  Location: [facility];  Service: Gynecology;  Laterality: N/A;   DILATION AND CURETTAGE OF UTERUS     myomectomy   MYOMECTOMY     vaginal -    WISDOM TOOTH EXTRACTION  2006    OB History   No obstetric history on file.      Home Medications    Prior to Admission medications   Medication Sig Start Date End Date Taking? Authorizing Provider  doxycycline (VIBRAMYCIN) 100 MG capsule Take 1 capsule (100 mg total) by mouth 2 (two) times daily. 01/23/22  Yes Francene Finders, PA-C  acetaminophen (TYLENOL) 500 MG tablet Take 1,000 mg by mouth every 6 (six) hours as needed for mild pain.    [provider]  ELDERBERRY PO Take by mouth.    [provider]  ibuprofen (ADVIL) 800 MG tablet Take 1 tablet (800 mg total) by mouth every 8 (eight) hours as needed (pain). 09/19/21   Barrett Henle, MD  levonorgestrel (MIRENA) 20 MCG/24HR IUD 1 each  by Intrauterine route once.    [provider]  Multiple Vitamin (MULTIVITAMIN WITH MINERALS) TABS tablet Take 1 tablet by mouth daily.    [provider]  Multiple Vitamins-Minerals (ZINC PO) Take by mouth.    [provider]    Family History Family History  Problem Relation Age of Onset   Hypertension Mother    Diabetes Mother    Obesity Mother    Ovarian cancer Other    Breast cancer Other    Fibroids Other     Social History Social History   Tobacco Use   Smoking status: Never   Smokeless tobacco: Never  Vaping Use   Vaping Use: Never used  Substance Use Topics   Alcohol use: Yes    Alcohol/week: 0.0 standard drinks of alcohol    Comment: occasional wine   Drug use: No     Allergies   Metformin and related   Review of Systems Review of Systems  Constitutional:   Negative for chills and fever.  Eyes:  Negative for discharge and redness.  Gastrointestinal:  Negative for abdominal pain, nausea and vomiting.     Physical Exam Triage Vital Signs ED Triage Vitals  Enc Vitals Group     BP      Pulse      Resp      Temp      Temp src      SpO2      Weight      Height      Head Circumference      Peak Flow      Pain Score      Pain Loc      Pain Edu?      Excl. in [place]?    No data found.  Updated Vital Signs BP 126/80 (BP Location: Left Arm)   Pulse 87   Temp 98.6 F (37 C) (Oral)   Resp 18   LMP 01/20/2022   SpO2 96%      Physical Exam Vitals and nursing note reviewed.  Constitutional:      General: She is not in acute distress.    Appearance: Normal appearance. She is not ill-appearing.  HENT:     Head: Normocephalic and atraumatic.  Eyes:     Conjunctiva/sclera: Conjunctivae normal.  Cardiovascular:     Rate and Rhythm: Normal rate.  Pulmonary:     Effort: Pulmonary effort is normal.  Skin:    Comments: Multinodular abscess palpated to right posterior neck with mild induration, no fluctuance.  Neurological:     Mental Status: She is alert.  Psychiatric:        Mood and Affect: Mood normal.        Behavior: Behavior normal.        Thought Content: Thought content normal.      UC Treatments / Results  Labs (all labs ordered are listed, but only abnormal results are displayed) Labs Reviewed - No data to display  EKG   Radiology No results found.  Procedures Procedures (including critical care time)  Medications Ordered in UC Medications - No data to display  Initial Impression / Assessment and Plan / UC Course  I have reviewed the triage vital signs and the nursing notes.  Pertinent labs & imaging results that were available during my care of the patient were reviewed by me and considered in my medical decision making (see chart for details).    Given history of surgical removal of abscess  recommended further evaluation by dermatology.  Will treat with antibiotics in the meantime.  Encouraged follow-up with any further concerns.  Final Clinical Impressions(s) / UC Diagnoses   Final diagnoses:  Abscess   Discharge Instructions   None    ED Prescriptions     Medication Sig Dispense Auth. Provider   doxycycline (VIBRAMYCIN) 100 MG capsule Take 1 capsule (100 mg total) by mouth 2 (two) times daily. 20 capsule Francene Finders, PA-C      PDMP not reviewed this encounter.   Francene Finders, PA-C 01/23/22 1717

## 2022-01-23 NOTE — ED Triage Notes (Signed)
Pt presents with recurring abscess on back of neck.

## 2022-04-24 ENCOUNTER — Ambulatory Visit
Admission: RE | Admit: 2022-04-24 | Discharge: 2022-04-24 | Disposition: A | Discharge status: Home / Self Care | Payer: Managed Care, Other (non HMO) | Source: Ambulatory Visit | Attending: Internal Medicine | Admitting: Internal Medicine

## 2022-04-24 VITALS — BP 145/87 | HR 99 | Temp 98.3°F | Resp 19

## 2022-04-24 DIAGNOSIS — J069 Acute upper respiratory infection, unspecified: Secondary | ICD-10-CM | POA: Diagnosis not present

## 2022-04-24 MED ORDER — FLUTICASONE PROPIONATE 50 MCG/ACT NA SUSP: 1.0000 | Freq: Every day | NASAL | 0 refills | Status: AC

## 2022-04-24 MED ORDER — BENZONATATE 100 MG PO CAPS: 100.0000 mg | ORAL_CAPSULE | Freq: Three times a day (TID) | ORAL | 0 refills | Status: DC | PRN

## 2022-04-24 NOTE — ED Provider Notes (Signed)
[facility]    CSN: [id number] Arrival date & time: 04/24/22  1121      History   Chief Complaint Chief Complaint  Patient presents with   Nasal Congestion    Sinus pressure and cough started yesterday. - Entered by patient   Cough    HPI Amanda Murphy is a 41 y.o. female.   Patient presents with nasal congestion and cough that started about 4 days ago.  Patient reports that she has been around someone with similar symptoms.  She took a COVID test at work that was negative.  She denies any known fevers at home.  She has taken several over-the-counter cold and flu medications with minimal improvement in symptoms.  Denies chest pain, shortness of breath, sore throat, ear pain, nausea, vomiting, diarrhea, abdominal pain.  Patient denies any formal diagnosis of asthma or COPD and is not a smoker.   Cough   Past Medical History:  Diagnosis Date   Abnormal uterine bleeding (AUB)    Anemia    Anxiety    Asthma    as child - no inhaler - no problem as adult   Astigmatism    Fibroids    H/O diabetes insipidus    s/p craniotomy   Headache(784.0)    hx - none since surgery   Hx of blood clots 2016   P. E.  p craniotomy. on Xarelto until 2017.   Joint pain    ankles and hips   PCOS (polycystic ovarian syndrome)    Pneumonia    Hx 3-4 yrs ago    Pre-diabetes    Pulmonary embolus (HCC)    Seasonal allergies    Vision abnormalities    Vitamin D deficiency    Wears glasses     Patient Active Problem List   Diagnosis Date Noted   Left axillary pain 10/25/2020   Abscess 12/17/2017   Iron deficiency anemia 01/11/2017   Insulin resistance 01/11/2017   Vitamin D deficiency 06/30/2016   Routine general medical examination at a health care facility 09/27/2015   PE (pulmonary embolism)    Brain tumor ([place]) 10/31/2014   Situational anxiety 10/19/2014   Allergic rhinitis 08/16/2014   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   Morbid obesity ([place]) 01/18/2014    Past  Surgical History:  Procedure Laterality Date   CRANIOTOMY N/A 10/31/2014   Procedure: CRANIOTOMY FOR RESECTION OF TUMOR;  Surgeon: Jovita Gamma, MD;  Location: [facility];  Service: Neurosurgery;  Laterality: N/A;  Craniotomy for resection of tumor   DILATATION & CURETTAGE/HYSTEROSCOPY WITH MYOSURE N/A 11/12/2016   Procedure: [place];  Surgeon: Marylynn Pearson, MD;  Location: [facility];  Service: Gynecology;  Laterality: N/A;   DILATION AND CURETTAGE OF UTERUS     myomectomy   MYOMECTOMY     vaginal -    WISDOM TOOTH EXTRACTION  2006    OB History   No obstetric history on file.      Home Medications    Prior to Admission medications   Medication Sig Start Date End Date Taking? Authorizing Provider  benzonatate (TESSALON) 100 MG capsule Take 1 capsule (100 mg total) by mouth every 8 (eight) hours as needed for cough. 04/24/22  Yes Mackenzie Groom, Hildred Alamin E, FNP  fluticasone (FLONASE) 50 MCG/ACT nasal spray Place 1 spray into both nostrils daily. 04/24/22  Yes Promiss Labarbera, Hildred Alamin E, FNP  acetaminophen (TYLENOL) 500 MG tablet Take 1,000 mg by mouth every 6 (six) hours as needed for mild  pain.    [provider]  doxycycline (VIBRAMYCIN) 100 MG capsule Take 1 capsule (100 mg total) by mouth 2 (two) times daily. 01/23/22   Francene Finders, PA-C  ELDERBERRY PO Take by mouth.    [provider]  ibuprofen (ADVIL) 800 MG tablet Take 1 tablet (800 mg total) by mouth every 8 (eight) hours as needed (pain). 09/19/21   Barrett Henle, MD  levonorgestrel (MIRENA) 20 MCG/24HR IUD 1 each by Intrauterine route once.    [provider]  Multiple Vitamin (MULTIVITAMIN WITH MINERALS) TABS tablet Take 1 tablet by mouth daily.    [provider]  Multiple Vitamins-Minerals (ZINC PO) Take by mouth.    [provider]    Family History Family History  Problem Relation Age of Onset   Hypertension Mother    Diabetes Mother     Obesity Mother    Ovarian cancer Other    Breast cancer Other    Fibroids Other     Social History Social History   Tobacco Use   Smoking status: Never   Smokeless tobacco: Never  Vaping Use   Vaping Use: Never used  Substance Use Topics   Alcohol use: Yes    Alcohol/week: 0.0 standard drinks of alcohol    Comment: occasional wine   Drug use: No     Allergies   Metformin and related   Review of Systems Review of Systems Per HPI  Physical Exam Triage Vital Signs ED Triage Vitals [04/24/22 1136]  Enc Vitals Group     BP (!) 145/87     Pulse Rate 99     Resp 19     Temp 98.3 F (36.8 C)     Temp src      SpO2 96 %     Weight      Height      Head Circumference      Peak Flow      Pain Score 0     Pain Loc      Pain Edu?      Excl. in [place]?    No data found.  Updated Vital Signs BP (!) 145/87   Pulse 99   Temp 98.3 F (36.8 C)   Resp 19   SpO2 96%   Visual Acuity Right Eye Distance:   Left Eye Distance:   Bilateral Distance:    Right Eye Near:   Left Eye Near:    Bilateral Near:     Physical Exam Constitutional:      General: She is not in acute distress.    Appearance: Normal appearance. She is not toxic-appearing.  HENT:     Head: Normocephalic and atraumatic.     Right Ear: Tympanic membrane and ear canal normal.     Left Ear: Tympanic membrane and ear canal normal.     Nose: Congestion present.     Mouth/Throat:     Mouth: Mucous membranes are moist.     Pharynx: No posterior oropharyngeal erythema.  Eyes:     Extraocular Movements: Extraocular movements intact.     Conjunctiva/sclera: Conjunctivae normal.     Pupils: Pupils are equal, round, and reactive to light.  Cardiovascular:     Rate and Rhythm: Normal rate and regular rhythm.     Pulses: Normal pulses.     Heart sounds: Normal heart sounds.  Pulmonary:     Effort: Pulmonary effort is normal. No respiratory distress.     Breath sounds: Normal  breath sounds. No stridor. No  wheezing, rhonchi or rales.  Abdominal:     General: Abdomen is flat. Bowel sounds are normal.     Palpations: Abdomen is soft.  Musculoskeletal:        General: Normal range of motion.     Cervical back: Normal range of motion.  Skin:    General: Skin is warm and dry.  Neurological:     General: No focal deficit present.     Mental Status: She is alert and oriented to person, place, and time. Mental status is at baseline.  Psychiatric:        Mood and Affect: Mood normal.        Behavior: Behavior normal.      UC Treatments / Results  Labs (all labs ordered are listed, but only abnormal results are displayed) Labs Reviewed - No data to display  EKG   Radiology No results found.  Procedures Procedures (including critical care time)  Medications Ordered in UC Medications - No data to display  Initial Impression / Assessment and Plan / UC Course  I have reviewed the triage vital signs and the nursing notes.  Pertinent labs & imaging results that were available during my care of the patient were reviewed by me and considered in my medical decision making (see chart for details).     Patient presents with symptoms likely from a viral upper respiratory infection. Differential includes bacterial pneumonia, sinusitis, allergic rhinitis, COVID-19, flu, RSV. Do not suspect underlying cardiopulmonary process. Symptoms seem unlikely related to ACS, CHF or COPD exacerbations, pneumonia, pneumothorax. Patient is nontoxic appearing and not in need of emergent medical intervention.  Patient declined COVID testing which is reasonable given that she had a negative home COVID test.  Recommended symptom control with over the counter medications. Patient sent prescriptions.   Return if symptoms fail to improve in 1-2 weeks or you develop shortness of breath, chest pain, severe headache. Patient states understanding and is agreeable.  Discharged with PCP followup.  Final Clinical  Impressions(s) / UC Diagnoses   Final diagnoses:  Viral upper respiratory tract infection with cough     Discharge Instructions      You have a viral upper respiratory infection which should run its course and self resolve with symptomatic treatment as we discussed.  I have prescribed you 2 medications to help alleviate symptoms and cough.  Please follow-up if any symptoms persist or worsen.    ED Prescriptions     Medication Sig Dispense Auth. Provider   fluticasone (FLONASE) 50 MCG/ACT nasal spray Place 1 spray into both nostrils daily. 16 g Reinhold Rickey, Hildred Alamin E, [place]   benzonatate (TESSALON) 100 MG capsule Take 1 capsule (100 mg total) by mouth every 8 (eight) hours as needed for cough. 21 capsule [place], Michele Rockers, [place]      PDMP not reviewed this encounter.   Teodora Medici, [place] 04/24/22 1154

## 2022-04-24 NOTE — Discharge Instructions (Signed)
You have a viral upper respiratory infection which should run its course and self resolve with symptomatic treatment as we discussed.  I have prescribed you 2 medications to help alleviate symptoms and cough.  Please follow-up if any symptoms persist or worsen.

## 2022-04-24 NOTE — ED Triage Notes (Signed)
Pt is present today with sinus congestion and cough. Pt sx started tuesday

## 2022-05-04 ENCOUNTER — Ambulatory Visit: Payer: Self-pay | Admitting: Internal Medicine

## 2022-05-08 ENCOUNTER — Ambulatory Visit: Payer: 59 | Admitting: Internal Medicine

## 2022-05-08 ENCOUNTER — Encounter: Payer: Self-pay | Admitting: Internal Medicine

## 2022-05-08 VITALS — BP 124/80 | HR 107 | Temp 98.5°F | Ht 64.0 in | Wt 371.0 lb

## 2022-05-08 DIAGNOSIS — S21109A Unspecified open wound of unspecified front wall of thorax without penetration into thoracic cavity, initial encounter: Secondary | ICD-10-CM

## 2022-05-08 DIAGNOSIS — D496 Neoplasm of unspecified behavior of brain: Secondary | ICD-10-CM

## 2022-05-08 DIAGNOSIS — M25562 Pain in left knee: Secondary | ICD-10-CM | POA: Insufficient documentation

## 2022-05-08 DIAGNOSIS — Z86711 Personal history of pulmonary embolism: Secondary | ICD-10-CM

## 2022-05-08 DIAGNOSIS — D509 Iron deficiency anemia, unspecified: Secondary | ICD-10-CM

## 2022-05-08 DIAGNOSIS — E88819 Insulin resistance, unspecified: Secondary | ICD-10-CM | POA: Diagnosis not present

## 2022-05-08 DIAGNOSIS — Z0000 Encounter for general adult medical examination without abnormal findings: Secondary | ICD-10-CM

## 2022-05-08 LAB — LIPID PANEL
Cholesterol: 149 mg/dL (ref 0–200)
HDL: 56.7 mg/dL (ref >39.00)
LDL Cholesterol: 73 mg/dL (ref 0–99)
NonHDL: 92.69
Total CHOL/HDL Ratio: 3
Triglycerides: 96 mg/dL (ref 0.0–149.0)
VLDL: 19.2 mg/dL (ref 0.0–40.0)

## 2022-05-08 LAB — CBC
HCT: 40 % (ref 36.0–46.0)
Hemoglobin: 13.3 g/dL (ref 12.0–15.0)
MCHC: 33.2 g/dL (ref 30.0–36.0)
MCV: 91.3 fl (ref 78.0–100.0)
Platelets: 425 10*3/uL — ABNORMAL HIGH (ref 150.0–400.0)
RBC: 4.38 Mil/uL (ref 3.87–5.11)
RDW: 15.4 % (ref 11.5–15.5)
WBC: 8.6 10*3/uL (ref 4.0–10.5)

## 2022-05-08 LAB — HEMOGLOBIN A1C: Hgb A1c MFr Bld: 6.7 % — ABNORMAL HIGH (ref 4.6–6.5)

## 2022-05-08 LAB — COMPREHENSIVE METABOLIC PANEL
ALT: 25 U/L (ref 0–35)
AST: 20 U/L (ref 0–37)
Albumin: 3.8 g/dL (ref 3.5–5.2)
Alkaline Phosphatase: 70 U/L (ref 39–117)
BUN: 12 mg/dL (ref 6–23)
CO2: 32 mEq/L (ref 19–32)
Calcium: 9 mg/dL (ref 8.4–10.5)
Chloride: 103 mEq/L (ref 96–112)
Creatinine, Ser: 0.86 mg/dL (ref 0.40–1.20)
GFR: 83.05 mL/min (ref >60.00)
Glucose, Bld: 81 mg/dL (ref 70–99)
Potassium: 4.1 mEq/L (ref 3.5–5.1)
Sodium: 141 mEq/L (ref 135–145)
Total Bilirubin: 0.4 mg/dL (ref 0.2–1.2)
Total Protein: 7.5 g/dL (ref 6.0–8.3)

## 2022-05-08 MED ORDER — SILVER SULFADIAZINE 1 % EX CREA: 1.0000 | TOPICAL_CREAM | Freq: Every day | CUTANEOUS | 0 refills | Status: DC

## 2022-05-08 NOTE — Assessment & Plan Note (Signed)
Suspect mild MCL tear/strain. About 2 weeks in to symptoms and maybe mild improvement. Give this another 1-2 weeks and let us know if not improving.

## 2022-05-08 NOTE — Patient Instructions (Signed)
Let us know if the knee is not improving.

## 2022-05-08 NOTE — Assessment & Plan Note (Signed)
Provoked and did treatment. No signs of recurrence. She knows to avoid smoking and estrogen products.

## 2022-05-08 NOTE — Assessment & Plan Note (Signed)
Stable s/p resection. No new headaches or vision changes.

## 2022-05-08 NOTE — Assessment & Plan Note (Signed)
Several small wounds on the right and left flank along bra line. Rx silvadene ointment to use daily. Keep covered and given some bandages to use.

## 2022-05-08 NOTE — Assessment & Plan Note (Signed)
Checking HgA1c and lipid panel. Adjust as needed. BP at goal. Counseled about diet and exercise.

## 2022-05-08 NOTE — Progress Notes (Signed)
   Subjective:   Patient ID: Amanda Murphy, female    DOB: Apr 21, 1981, 41 y.o.   MRN: [id number]  HPI The patient is here for physical also needing acute for wounds on chest wall.   PMH, [facility], social history reviewed and updated  Review of Systems  Constitutional: Negative.   HENT: Negative.    Eyes: Negative.   Respiratory:  Negative for cough, chest tightness and shortness of breath.   Cardiovascular:  Negative for chest pain, palpitations and leg swelling.  Gastrointestinal:  Negative for abdominal distention, abdominal pain, constipation, diarrhea, nausea and vomiting.  Musculoskeletal:  Positive for arthralgias.  Skin:  Positive for wound.  Neurological: Negative.   Psychiatric/Behavioral: Negative.      Objective:  Physical Exam Constitutional:      Appearance: She is well-developed. She is obese.  HENT:     Head: Normocephalic and atraumatic.  Cardiovascular:     Rate and Rhythm: Normal rate and regular rhythm.     Comments: 3 wounds <15m on the right flank along bra line, 1 wound 185mleft flank along bra line.  Pulmonary:     Effort: Pulmonary effort is normal. No respiratory distress.     Breath sounds: Normal breath sounds. No wheezing or rales.  Abdominal:     General: Bowel sounds are normal. There is no distension.     Palpations: Abdomen is soft.     Tenderness: There is no abdominal tenderness. There is no rebound.  Musculoskeletal:        General: Tenderness present.     Cervical back: Normal range of motion.     Comments: Pain along MCL left knee, ACL/PCL intact  Skin:    General: Skin is warm and dry.  Neurological:     Mental Status: She is alert and oriented to person, place, and time.     Coordination: Coordination normal.     Vitals:   05/08/22 1031  BP: 124/80  Pulse: (!) 107  Temp: 98.5 F (36.9 C)  TempSrc: Oral  SpO2: 98%  Weight: (!) 371 lb (168.3 kg)  Height: '5\' 4"'$  (1.626 m)    Assessment & Plan:

## 2022-05-08 NOTE — Assessment & Plan Note (Signed)
Checking HgA1c. Adjust as needed. 

## 2022-05-08 NOTE — Assessment & Plan Note (Signed)
Checking CBC, no bleeding with stable IUD in place.

## 2022-05-08 NOTE — Assessment & Plan Note (Signed)
Flu shot yearly. Covid-19 counseled. Tetanus declines. Mammogram up to date, pap smear up to date. Counseled about sun safety and mole surveillance. Counseled about the dangers of distracted driving. Given 10 year screening recommendations.

## 2022-05-11 ENCOUNTER — Other Ambulatory Visit: Payer: Self-pay | Admitting: Internal Medicine

## 2022-05-11 MED ORDER — OZEMPIC (0.25 OR 0.5 MG/DOSE) 2 MG/3ML ~~LOC~~ SOPN: PEN_INJECTOR | SUBCUTANEOUS | 1 refills | Status: AC

## 2022-06-23 ENCOUNTER — Other Ambulatory Visit (HOSPITAL_COMMUNITY): Payer: Self-pay

## 2022-06-23 ENCOUNTER — Other Ambulatory Visit: Payer: Self-pay | Admitting: Internal Medicine

## 2022-06-23 ENCOUNTER — Encounter: Payer: Self-pay | Admitting: Internal Medicine

## 2022-06-26 ENCOUNTER — Other Ambulatory Visit (HOSPITAL_COMMUNITY): Payer: Self-pay

## 2022-06-26 NOTE — Telephone Encounter (Signed)
Patient Advocate Encounter   Received notification from [employer] that prior authorization for Ozempic is required.   PA submitted on 06/26/2022 Key [id number] Status is pending

## 2022-06-29 NOTE — Telephone Encounter (Signed)
Pharmacy Patient Advocate Encounter  Received notification from [place] that the request for prior authorization for Ozempic '2mg'$ /27m has been denied due to .    You may call [phone] fax [phone] to appeal.  Please be advised we currently do not have a Pharmacist to review denials. If you would like [employer] submit it on your behalf, please provide clinical information to support your reason for appeal and any pertinent information you would like [employer] include with the appeal request. Appeals may take longer 5 business days to be submitted as we prepares necessary documentation. Thanks for your support.  How would you like to proceed?

## 2022-08-14 ENCOUNTER — Ambulatory Visit: Payer: 59 | Admitting: Internal Medicine

## 2022-10-04 ENCOUNTER — Other Ambulatory Visit: Payer: Self-pay | Admitting: Internal Medicine

## 2022-10-09 ENCOUNTER — Ambulatory Visit: Payer: 59 | Admitting: Internal Medicine

## 2022-10-09 ENCOUNTER — Encounter: Payer: Self-pay | Admitting: Internal Medicine

## 2022-10-09 VITALS — BP 112/84 | HR 95 | Temp 98.9°F | Ht 64.0 in | Wt 366.0 lb

## 2022-10-09 DIAGNOSIS — Z7985 Long-term (current) use of injectable non-insulin antidiabetic drugs: Secondary | ICD-10-CM | POA: Diagnosis not present

## 2022-10-09 DIAGNOSIS — E119 Type 2 diabetes mellitus without complications: Secondary | ICD-10-CM | POA: Diagnosis not present

## 2022-10-09 DIAGNOSIS — D496 Neoplasm of unspecified behavior of brain: Secondary | ICD-10-CM

## 2022-10-09 MED ORDER — SEMAGLUTIDE (1 MG/DOSE) 4 MG/3ML ~~LOC~~ SOPN: 1.0000 mg | PEN_INJECTOR | SUBCUTANEOUS | 0 refills | Status: DC

## 2022-10-09 MED ORDER — SILVER SULFADIAZINE 1 % EX CREA: TOPICAL_CREAM | Freq: Two times a day (BID) | CUTANEOUS | 11 refills | Status: AC

## 2022-10-09 MED ORDER — OZEMPIC (0.25 OR 0.5 MG/DOSE) 2 MG/3ML ~~LOC~~ SOPN: PEN_INJECTOR | SUBCUTANEOUS | 0 refills | Status: DC

## 2022-10-09 MED ORDER — SEMAGLUTIDE (2 MG/DOSE) 8 MG/3ML ~~LOC~~ SOPN: 2.0000 mg | PEN_INJECTOR | SUBCUTANEOUS | 5 refills | Status: DC

## 2022-10-09 NOTE — Assessment & Plan Note (Addendum)
She has tried metformin and was unable to tolerate this. We will prescribe ozempic for her sugars to help with control. Standard titration was prescribed. Counseled about risk and benefit. Will get microalbumin to creatinine ratio next visit. Foot exam done and counseled about eye exam.

## 2022-10-09 NOTE — Assessment & Plan Note (Signed)
We will try to obtain ozempic to help with weight and sugar control. Counseled about diet and exercise as well.

## 2022-10-09 NOTE — Progress Notes (Signed)
   Subjective:   Patient ID: Amanda Murphy, female    DOB: 1980-12-13, 42 y.o.   MRN: [id number]  HPI The patient is a 42 YO female coming in for her new diabetes. Has previously tried metformin and was unable to tolerate due to side effects. She would like to try ozempic for better control.   Review of Systems  Constitutional: Negative.   HENT: Negative.    Eyes: Negative.   Respiratory:  Negative for cough, chest tightness and shortness of breath.   Cardiovascular:  Negative for chest pain, palpitations and leg swelling.  Gastrointestinal:  Negative for abdominal distention, abdominal pain, constipation, diarrhea, nausea and vomiting.  Musculoskeletal: Negative.   Skin: Negative.   Neurological: Negative.   Psychiatric/Behavioral: Negative.      Objective:  Physical Exam Constitutional:      Appearance: She is well-developed. She is obese.  HENT:     Head: Normocephalic and atraumatic.  Cardiovascular:     Rate and Rhythm: Normal rate and regular rhythm.  Pulmonary:     Effort: Pulmonary effort is normal. No respiratory distress.     Breath sounds: Normal breath sounds. No wheezing or rales.  Abdominal:     General: Bowel sounds are normal. There is no distension.     Palpations: Abdomen is soft.     Tenderness: There is no abdominal tenderness. There is no rebound.  Musculoskeletal:     Cervical back: Normal range of motion.  Skin:    General: Skin is warm and dry.  Neurological:     Mental Status: She is alert and oriented to person, place, and time.     Coordination: Coordination normal.     Vitals:   10/09/22 1021  BP: 112/84  Pulse: 95  Temp: 98.9 F (37.2 C)  TempSrc: Oral  SpO2: 95%  Weight: (!) 366 lb (166 kg)  Height: 5\' 4"  (1.626 m)    Assessment & Plan:

## 2022-10-16 ENCOUNTER — Telehealth: Payer: Self-pay | Admitting: *Deleted

## 2022-10-16 NOTE — Telephone Encounter (Signed)
Prior auth started via cover my meds.  Awaiting determination.  Key: [id number]

## 2022-10-19 NOTE — Telephone Encounter (Signed)
Patient Name: Amanda Murphy Patient DOB: 02-09-1981 Patient ID: [id number] Status of Request: Approve Medication Name: Dedra Skeens 2mg /40ml GPI/NDC: [id number] D221 Decision Notes: OZEMPIC INJ 2MG Ronny Bacon, use as directed (3ml per month), is approved for 12 months through 05/13/ 2025 or until coverage for the medication is no longer available under your benefit plan or the medication becomes subject to a pharmacy benefit coverage requirement, such as supply limits or notification, whichever occurs first as allowed by law. Please note: Doses/quantities above plan limits and/or maximum Food and Drug Administration (FDA) approved dosing may be subject to further review. Reviewed by: Lemar LoftyPh.

## 2023-01-11 ENCOUNTER — Other Ambulatory Visit: Payer: Self-pay | Admitting: Internal Medicine

## 2023-04-05 ENCOUNTER — Other Ambulatory Visit: Payer: Self-pay | Admitting: Internal Medicine

## 2023-04-06 ENCOUNTER — Other Ambulatory Visit: Payer: Self-pay | Admitting: Internal Medicine

## 2023-04-06 NOTE — Telephone Encounter (Signed)
We would need information about what dose she is currently taking? We typically do a follow up 3-6 months after starting and sent in first 4 months of medication.

## 2023-04-07 NOTE — Telephone Encounter (Signed)
Please see note and get me the info requested.

## 2023-04-09 NOTE — Telephone Encounter (Signed)
Patient called and states that she is on 5 for her ozempic dose

## 2023-04-09 NOTE — Telephone Encounter (Signed)
Called patient and left a voicemail regarding what dosage patient take. And to also schedule and appointment

## 2023-04-12 NOTE — Telephone Encounter (Signed)
5 is not a valid dose of ozempic: it ranges from 0.25 to 2 mg weekly please confirm a valid dose of medication

## 2023-04-13 NOTE — Telephone Encounter (Signed)
Called patient and she in unsure what dosage is correct all she stated was that she is taking 2.5 mg for 4 weeks

## 2023-04-15 NOTE — Telephone Encounter (Signed)
She should not be on the same dose for months. This medication is meant to increase every month if doing well. She should already have the other doses at her pharmacy and should follow the instructions for increasing every month.

## 2023-05-09 ENCOUNTER — Other Ambulatory Visit: Payer: Self-pay | Admitting: Internal Medicine

## 2023-05-11 ENCOUNTER — Ambulatory Visit (INDEPENDENT_AMBULATORY_CARE_PROVIDER_SITE_OTHER): Payer: 59 | Admitting: Internal Medicine

## 2023-05-11 ENCOUNTER — Telehealth: Payer: Self-pay

## 2023-05-11 VITALS — BP 124/82 | HR 108 | Temp 97.9°F | Ht 64.0 in | Wt 364.0 lb

## 2023-05-11 DIAGNOSIS — E118 Type 2 diabetes mellitus with unspecified complications: Secondary | ICD-10-CM

## 2023-05-11 DIAGNOSIS — L732 Hidradenitis suppurativa: Secondary | ICD-10-CM

## 2023-05-11 DIAGNOSIS — Z0000 Encounter for general adult medical examination without abnormal findings: Secondary | ICD-10-CM

## 2023-05-11 DIAGNOSIS — Z86711 Personal history of pulmonary embolism: Secondary | ICD-10-CM

## 2023-05-11 DIAGNOSIS — Z23 Encounter for immunization: Secondary | ICD-10-CM | POA: Diagnosis not present

## 2023-05-11 DIAGNOSIS — Z7985 Long-term (current) use of injectable non-insulin antidiabetic drugs: Secondary | ICD-10-CM

## 2023-05-11 DIAGNOSIS — D496 Neoplasm of unspecified behavior of brain: Secondary | ICD-10-CM

## 2023-05-11 LAB — COMPREHENSIVE METABOLIC PANEL
ALT: 21 U/L (ref 0–35)
AST: 18 U/L (ref 0–37)
Albumin: 3.8 g/dL (ref 3.5–5.2)
Alkaline Phosphatase: 76 U/L (ref 39–117)
BUN: 12 mg/dL (ref 6–23)
CO2: 32 meq/L (ref 19–32)
Calcium: 9.3 mg/dL (ref 8.4–10.5)
Chloride: 102 meq/L (ref 96–112)
Creatinine, Ser: 0.99 mg/dL (ref 0.40–1.20)
GFR: 69.64 mL/min (ref >60.00)
Glucose, Bld: 95 mg/dL (ref 70–99)
Potassium: 3.6 meq/L (ref 3.5–5.1)
Sodium: 140 meq/L (ref 135–145)
Total Bilirubin: 0.5 mg/dL (ref 0.2–1.2)
Total Protein: 7.3 g/dL (ref 6.0–8.3)

## 2023-05-11 LAB — LIPID PANEL
Cholesterol: 159 mg/dL (ref 0–200)
HDL: 49.2 mg/dL (ref >39.00)
LDL Cholesterol: 89 mg/dL (ref 0–99)
NonHDL: 110.12
Total CHOL/HDL Ratio: 3
Triglycerides: 108 mg/dL (ref 0.0–149.0)
VLDL: 21.6 mg/dL (ref 0.0–40.0)

## 2023-05-11 LAB — CBC
HCT: 43.8 % (ref 36.0–46.0)
Hemoglobin: 14.3 g/dL (ref 12.0–15.0)
MCHC: 32.7 g/dL (ref 30.0–36.0)
MCV: 92.7 fL (ref 78.0–100.0)
Platelets: 353 10*3/uL (ref 150.0–400.0)
RBC: 4.73 Mil/uL (ref 3.87–5.11)
RDW: 15.6 % — ABNORMAL HIGH (ref 11.5–15.5)
WBC: 9.3 10*3/uL (ref 4.0–10.5)

## 2023-05-11 LAB — MICROALBUMIN / CREATININE URINE RATIO
Creatinine,U: 239.4 mg/dL
Microalb Creat Ratio: 1.3 mg/g (ref 0.0–30.0)
Microalb, Ur: 3.1 mg/dL — ABNORMAL HIGH (ref 0.0–1.9)

## 2023-05-11 LAB — HEMOGLOBIN A1C: Hgb A1c MFr Bld: 6.3 % (ref 4.6–6.5)

## 2023-05-11 MED ORDER — SEMAGLUTIDE (1 MG/DOSE) 4 MG/3ML ~~LOC~~ SOPN: 1.0000 mg | PEN_INJECTOR | SUBCUTANEOUS | 0 refills | Status: DC

## 2023-05-11 MED ORDER — SEMAGLUTIDE (2 MG/DOSE) 8 MG/3ML ~~LOC~~ SOPN: 2.0000 mg | PEN_INJECTOR | SUBCUTANEOUS | 5 refills | Status: DC

## 2023-05-11 NOTE — Progress Notes (Unsigned)
   Subjective:   Patient ID: Amanda Murphy, female    DOB: [birth year], 42 y.o.   MRN: [id number]  HPI The patient is here for physical.  PMH, [facility], social history reviewed and updated  Review of Systems  Objective:  Physical Exam  Vitals:   05/11/23 0924  BP: 124/82  Pulse: (!) 108  Temp: 97.9 F (36.6 C)  TempSrc: Oral  SpO2: 93%  Weight: (!) 364 lb (165.1 kg)  Height: 5\' 4"  (1.626 m)    Assessment & Plan:

## 2023-05-11 NOTE — Patient Instructions (Signed)
We will increase the ozempic to 1 mg weekly for 1 month then increase to 2 mg weekly for ongoing.

## 2023-05-11 NOTE — Telephone Encounter (Signed)
Should go up to 1 mg weekly for 1 month then increase to 2 mg weekly and stay there

## 2023-05-12 NOTE — Assessment & Plan Note (Addendum)
Flu shot given at visit. Tetanus declines. Mammogram with gyn, pap smear with gyn. Counseled about sun safety and mole surveillance. Counseled about the dangers of distracted driving. Given 10 year screening recommendations.

## 2023-05-12 NOTE — Assessment & Plan Note (Signed)
Checking microalbumin to creatinine ratio and HgA1c and lipid panel and CMP. Adjust as needed. She is taking ozempic and losing weight. No side effects will titrate to 1 mg weekly then 2 mg weekly 1 month later for maximal effects.

## 2023-05-12 NOTE — Assessment & Plan Note (Signed)
Stable gets intermittent monitoring. No new headaches, vision changes.

## 2023-05-12 NOTE — Assessment & Plan Note (Signed)
No recurrent symptoms.

## 2023-05-12 NOTE — Assessment & Plan Note (Signed)
Using ozempic to lose weight and for some reason pharmacy would not titrate her ozempic. New rx done for 1 mg and 2 mg weekly.

## 2023-05-12 NOTE — Assessment & Plan Note (Signed)
Seeing derm and is on oral antibiotics for suppression currently.

## 2023-05-27 ENCOUNTER — Encounter: Payer: Self-pay | Admitting: Internal Medicine

## 2023-05-27 NOTE — Telephone Encounter (Signed)
 Care team updated and letter sent for eye exam notes.

## 2023-09-20 ENCOUNTER — Other Ambulatory Visit (HOSPITAL_COMMUNITY): Payer: Self-pay

## 2023-09-20 NOTE — Telephone Encounter (Signed)
 ERROR

## 2023-09-29 ENCOUNTER — Other Ambulatory Visit (HOSPITAL_COMMUNITY): Payer: Self-pay

## 2023-09-29 ENCOUNTER — Telehealth: Payer: Self-pay

## 2023-09-29 NOTE — Telephone Encounter (Signed)
 Pharmacy Patient Advocate Encounter  Received notification from [facility] that Prior Authorization for OZEMPIC  8MG /3ML has been APPROVED from 09/29/2023 to 09/28/2024. Ran test claim, Copay is $24.99. This test claim was processed through [facility]- copay amounts may vary at other pharmacies due to pharmacy/plan contracts, or as the patient moves through the different stages of their insurance plan.   PA #/Case ID/Reference #: [id number]

## 2023-09-29 NOTE — Telephone Encounter (Signed)
 Pharmacy Patient Advocate Encounter   Received notification from CoverMyMeds that prior authorization for Ozempic  (2 MG/DOSE) 8MG /3ML pen-injectors is required/requested.   Insurance verification completed.   The patient is insured through [facility] .   Per test claim: PA required; PA submitted to above mentioned insurance via CoverMyMeds Key/confirmation #/EOC [id number] Status is pending

## 2023-10-15 DIAGNOSIS — Z1151 Encounter for screening for human papillomavirus (HPV): Secondary | ICD-10-CM | POA: Diagnosis not present

## 2023-10-15 DIAGNOSIS — Z1231 Encounter for screening mammogram for malignant neoplasm of breast: Secondary | ICD-10-CM | POA: Diagnosis not present

## 2023-10-15 DIAGNOSIS — Z124 Encounter for screening for malignant neoplasm of cervix: Secondary | ICD-10-CM | POA: Diagnosis not present

## 2023-10-15 DIAGNOSIS — Z01419 Encounter for gynecological examination (general) (routine) without abnormal findings: Secondary | ICD-10-CM | POA: Diagnosis not present

## 2023-10-15 DIAGNOSIS — Z6841 Body Mass Index (BMI) 40.0 and over, adult: Secondary | ICD-10-CM | POA: Diagnosis not present

## 2023-11-05 DIAGNOSIS — L732 Hidradenitis suppurativa: Secondary | ICD-10-CM | POA: Diagnosis not present

## 2024-01-03 DIAGNOSIS — L0211 Cutaneous abscess of neck: Secondary | ICD-10-CM | POA: Diagnosis not present

## 2024-01-03 DIAGNOSIS — L732 Hidradenitis suppurativa: Secondary | ICD-10-CM | POA: Diagnosis not present

## 2024-03-21 ENCOUNTER — Other Ambulatory Visit: Payer: Self-pay | Admitting: Internal Medicine

## 2024-03-21 NOTE — Telephone Encounter (Signed)
 Overdue for follow up (was due in June) Ok for 30 day once apt scheduled

## 2024-03-28 NOTE — Telephone Encounter (Signed)
 Follow up appointment has been scheduled for 04/07/24@840am . Pt is aware.

## 2024-03-29 NOTE — Telephone Encounter (Signed)
 Spoke with pt on yesterday and scheduled office visit for 10/31@840am . Pt stated at that time that she has enough of meds to get her to appointment.

## 2024-04-07 ENCOUNTER — Encounter: Payer: Self-pay | Admitting: Internal Medicine

## 2024-04-07 ENCOUNTER — Ambulatory Visit: Admitting: Internal Medicine

## 2024-04-07 VITALS — BP 102/70 | HR 78 | Temp 98.5°F | Ht 64.0 in | Wt 349.2 lb

## 2024-04-07 DIAGNOSIS — E118 Type 2 diabetes mellitus with unspecified complications: Secondary | ICD-10-CM

## 2024-04-07 DIAGNOSIS — Z23 Encounter for immunization: Secondary | ICD-10-CM | POA: Diagnosis not present

## 2024-04-07 NOTE — Assessment & Plan Note (Signed)
 She has achieved a 16-pound weight loss since the last visit, showing progress with Ozempic , dietary changes, and exercise. Continue the current dose of Ozempic . Maintain dietary modifications, focusing on packing lunches and avoiding fried foods. Encourage a gradual increase in physical activity, including water-based and chair exercises.

## 2024-04-07 NOTE — Progress Notes (Signed)
 Subjective:   Patient ID: Amanda Murphy, female    DOB: [birth year], 43 y.o.   MRN: [id number]  Discussed the use of AI scribe software for clinical note transcription with the patient, who gave verbal consent to proceed.  History of Present Illness Amanda Murphy Amanda Murphy is a 43 year old female who presents for follow-up regarding weight management and medication adjustment.  She has lost approximately sixteen pounds since her last visit through a slow and steady approach, weighing herself once a week to avoid obsessing over the scale.  She is currently on Ozempic , which was increased in dosage. She experiences increased gas as a side effect, which she manages with Gas-X. No constipation or chest pain, tightness, or breathing problems were reported by the patient. The initial increase in Ozempic  dosage had a noticeable effect, but the subsequent increase did not make a significant difference.  She has adjusted her diet due to the medication, avoiding fried foods which cause nausea. She now packs her lunch more often to avoid eating out, particularly at places like [employer]. She is working on a meal plan that fits her work schedule, focusing on consistency with water intake and meal preparation.  She is working on increasing her physical activity. She has started using the [employer], doing exercises in the water to reduce joint strain. She is gradually building up her endurance and strength, incorporating chair exercises she found online, which she finds surprisingly challenging. She is motivated to return to activities she previously enjoyed, like participating in 62Ks.  Review of Systems  Constitutional: Negative.   HENT: Negative.    Eyes: Negative.   Respiratory:  Negative for cough, chest tightness and shortness of breath.   Cardiovascular:  Negative for chest pain, palpitations and leg swelling.  Gastrointestinal:  Negative for abdominal distention, abdominal pain,  constipation, diarrhea, nausea and vomiting.  Musculoskeletal:  Positive for arthralgias.  Skin: Negative.   Neurological: Negative.   Psychiatric/Behavioral: Negative.      Objective:  Physical Exam Constitutional:      Appearance: She is well-developed. She is obese.  HENT:     Head: Normocephalic and atraumatic.  Cardiovascular:     Rate and Rhythm: Normal rate and regular rhythm.  Pulmonary:     Effort: Pulmonary effort is normal. No respiratory distress.     Breath sounds: Normal breath sounds. No wheezing or rales.  Abdominal:     General: Bowel sounds are normal. There is no distension.     Palpations: Abdomen is soft.     Tenderness: There is no abdominal tenderness.  Musculoskeletal:        General: Tenderness present.     Cervical back: Normal range of motion.  Skin:    General: Skin is warm and dry.  Neurological:     Mental Status: She is alert and oriented to person, place, and time.     Coordination: Coordination normal.     Vitals:   04/07/24 0836  BP: 102/70  Pulse: 78  Temp: 98.5 F (36.9 C)  TempSrc: Oral  SpO2: 97%  Weight: (!) 349 lb 3.2 oz (158.4 kg)  Height: 5' 4 (1.626 m)    Assessment and Plan Assessment & Plan Morbid Obesity   She has achieved a 16-pound weight loss since the last visit, showing progress with Ozempic , dietary changes, and exercise. Continue the current dose of Ozempic . Maintain dietary modifications, focusing on packing lunches and avoiding fried foods. Encourage a gradual increase in  physical activity, including water-based and chair exercises.  Type 2 diabetes   Her Type 2 diabetes is managed with Ozempic , which also supports weight loss. Dietary adjustments help manage blood sugar levels. Continue Ozempic  for diabetes management. Maintain dietary adjustments to support blood sugar control. Encourage consistent meal planning and hydration. We will check labs in December with physical. She has tried and been unable to  tolerate metformin in the past.

## 2024-04-07 NOTE — Assessment & Plan Note (Signed)
 Her Type 2 diabetes is managed with Ozempic , which also supports weight loss. Dietary adjustments help manage blood sugar levels. Continue Ozempic  for diabetes management. Maintain dietary adjustments to support blood sugar control. Encourage consistent meal planning and hydration. We will check labs in December with physical. She has tried and been unable to tolerate metformin in the past.

## 2024-04-19 ENCOUNTER — Other Ambulatory Visit: Payer: Self-pay | Admitting: Internal Medicine

## 2024-05-20 ENCOUNTER — Other Ambulatory Visit: Payer: Self-pay | Admitting: Internal Medicine

## 2024-05-22 ENCOUNTER — Encounter: Payer: Self-pay | Admitting: Internal Medicine

## 2024-06-05 DIAGNOSIS — L732 Hidradenitis suppurativa: Secondary | ICD-10-CM | POA: Diagnosis not present

## 2024-06-21 ENCOUNTER — Other Ambulatory Visit: Payer: Self-pay | Admitting: Internal Medicine
